# Patient Record
Sex: Female | Born: 1935 | Race: White | Hispanic: No | Smoking: Never smoker
Health system: Southern US, Community
[De-identification: ages and names within clinical notes are randomized; demographics above are authoritative.]

## PROBLEM LIST (undated history)

## (undated) DIAGNOSIS — R1310 Dysphagia, unspecified: Secondary | ICD-10-CM

## (undated) DIAGNOSIS — E46 Unspecified protein-calorie malnutrition: Secondary | ICD-10-CM

## (undated) DIAGNOSIS — R634 Abnormal weight loss: Secondary | ICD-10-CM

## (undated) DIAGNOSIS — M5136 Other intervertebral disc degeneration, lumbar region: Secondary | ICD-10-CM

## (undated) DIAGNOSIS — M51369 Other intervertebral disc degeneration, lumbar region without mention of lumbar back pain or lower extremity pain: Secondary | ICD-10-CM

## (undated) DIAGNOSIS — E079 Disorder of thyroid, unspecified: Secondary | ICD-10-CM

## (undated) DIAGNOSIS — E039 Hypothyroidism, unspecified: Secondary | ICD-10-CM

## (undated) DIAGNOSIS — F0390 Unspecified dementia without behavioral disturbance: Secondary | ICD-10-CM

## (undated) DIAGNOSIS — I639 Cerebral infarction, unspecified: Secondary | ICD-10-CM

## (undated) HISTORY — PX: THYROIDECTOMY: SHX17

## (undated) HISTORY — PX: ABDOMINAL HYSTERECTOMY: SHX81

## (undated) HISTORY — PX: APPENDECTOMY: SHX54

## (undated) HISTORY — PX: HERNIA REPAIR: SHX51

---

## 1998-05-09 ENCOUNTER — Ambulatory Visit (HOSPITAL_COMMUNITY): Admission: RE | Admit: 1998-05-09 | Discharge: 1998-05-09 | Payer: Self-pay | Admitting: Gastroenterology

## 1998-09-01 ENCOUNTER — Encounter: Admission: RE | Admit: 1998-09-01 | Discharge: 1998-11-23 | Payer: Self-pay | Admitting: Anesthesiology

## 1998-09-07 ENCOUNTER — Encounter: Admission: RE | Admit: 1998-09-07 | Discharge: 1998-12-06 | Payer: Self-pay | Admitting: Anesthesiology

## 1998-11-23 ENCOUNTER — Encounter: Admission: RE | Admit: 1998-11-23 | Discharge: 1999-02-09 | Payer: Self-pay | Admitting: Anesthesiology

## 2000-01-14 ENCOUNTER — Inpatient Hospital Stay (HOSPITAL_COMMUNITY): Admission: EM | Admit: 2000-01-14 | Discharge: 2000-01-17 | Payer: Self-pay | Admitting: *Deleted

## 2000-01-14 ENCOUNTER — Encounter: Payer: Self-pay | Admitting: Neurology

## 2000-01-14 ENCOUNTER — Encounter: Payer: Self-pay | Admitting: *Deleted

## 2000-01-17 ENCOUNTER — Inpatient Hospital Stay (HOSPITAL_COMMUNITY): Admission: EM | Admit: 2000-01-17 | Discharge: 2000-01-23 | Payer: Self-pay | Admitting: *Deleted

## 2001-01-28 ENCOUNTER — Encounter: Payer: Self-pay | Admitting: Endocrinology

## 2001-01-28 ENCOUNTER — Encounter: Admission: RE | Admit: 2001-01-28 | Discharge: 2001-01-28 | Payer: Self-pay | Admitting: Endocrinology

## 2001-02-05 ENCOUNTER — Encounter: Payer: Self-pay | Admitting: Emergency Medicine

## 2001-02-05 ENCOUNTER — Inpatient Hospital Stay: Admission: EM | Admit: 2001-02-05 | Discharge: 2001-02-06 | Payer: Self-pay | Admitting: Emergency Medicine

## 2001-02-06 ENCOUNTER — Inpatient Hospital Stay (HOSPITAL_COMMUNITY): Admission: EM | Admit: 2001-02-06 | Discharge: 2001-02-12 | Payer: Self-pay | Admitting: *Deleted

## 2001-12-08 ENCOUNTER — Encounter: Payer: Self-pay | Admitting: Endocrinology

## 2001-12-08 ENCOUNTER — Encounter: Admission: RE | Admit: 2001-12-08 | Discharge: 2001-12-08 | Payer: Self-pay | Admitting: Endocrinology

## 2003-11-21 ENCOUNTER — Emergency Department (HOSPITAL_COMMUNITY): Admission: EM | Admit: 2003-11-21 | Discharge: 2003-11-21 | Payer: Self-pay | Admitting: Emergency Medicine

## 2003-12-11 ENCOUNTER — Inpatient Hospital Stay (HOSPITAL_COMMUNITY): Admission: EM | Admit: 2003-12-11 | Discharge: 2003-12-13 | Payer: Self-pay | Admitting: Emergency Medicine

## 2004-06-02 ENCOUNTER — Encounter: Admission: RE | Admit: 2004-06-02 | Discharge: 2004-06-02 | Payer: Self-pay | Admitting: Endocrinology

## 2005-04-02 ENCOUNTER — Encounter: Admission: RE | Admit: 2005-04-02 | Discharge: 2005-04-02 | Payer: Self-pay | Admitting: Endocrinology

## 2010-12-16 ENCOUNTER — Encounter: Payer: Self-pay | Admitting: *Deleted

## 2012-06-20 ENCOUNTER — Encounter (HOSPITAL_COMMUNITY): Payer: Self-pay | Admitting: Emergency Medicine

## 2012-06-20 ENCOUNTER — Emergency Department (HOSPITAL_COMMUNITY): Payer: Medicare Other

## 2012-06-20 ENCOUNTER — Other Ambulatory Visit: Payer: Self-pay

## 2012-06-20 ENCOUNTER — Inpatient Hospital Stay (HOSPITAL_COMMUNITY)
Admission: EM | Admit: 2012-06-20 | Discharge: 2012-06-23 | DRG: 558 | Disposition: A | Discharge status: Skilled Nursing Facility | Payer: Medicare Other | Attending: Internal Medicine | Admitting: Internal Medicine

## 2012-06-20 DIAGNOSIS — M25559 Pain in unspecified hip: Secondary | ICD-10-CM | POA: Diagnosis present

## 2012-06-20 DIAGNOSIS — W010XXA Fall on same level from slipping, tripping and stumbling without subsequent striking against object, initial encounter: Secondary | ICD-10-CM | POA: Diagnosis present

## 2012-06-20 DIAGNOSIS — Y92009 Unspecified place in unspecified non-institutional (private) residence as the place of occurrence of the external cause: Secondary | ICD-10-CM

## 2012-06-20 DIAGNOSIS — M25569 Pain in unspecified knee: Secondary | ICD-10-CM | POA: Diagnosis present

## 2012-06-20 DIAGNOSIS — IMO0002 Reserved for concepts with insufficient information to code with codable children: Secondary | ICD-10-CM

## 2012-06-20 DIAGNOSIS — Z66 Do not resuscitate: Secondary | ICD-10-CM | POA: Diagnosis present

## 2012-06-20 DIAGNOSIS — S76319A Strain of muscle, fascia and tendon of the posterior muscle group at thigh level, unspecified thigh, initial encounter: Secondary | ICD-10-CM | POA: Diagnosis present

## 2012-06-20 DIAGNOSIS — M6688 Spontaneous rupture of other tendons, other: Principal | ICD-10-CM | POA: Diagnosis present

## 2012-06-20 DIAGNOSIS — S76119A Strain of unspecified quadriceps muscle, fascia and tendon, initial encounter: Secondary | ICD-10-CM

## 2012-06-20 HISTORY — DX: Cerebral infarction, unspecified: I63.9

## 2012-06-20 HISTORY — DX: Disorder of thyroid, unspecified: E07.9

## 2012-06-20 LAB — BASIC METABOLIC PANEL
BUN: 14 mg/dL (ref 6–23)
Calcium: 9 mg/dL (ref 8.4–10.5)
Chloride: 98 mEq/L (ref 96–112)
Creatinine, Ser: 0.83 mg/dL (ref 0.50–1.10)
GFR calc Af Amer: 70 mL/min — ABNORMAL LOW (ref >90)
GFR calc non Af Amer: 60 mL/min — ABNORMAL LOW (ref >90)
Potassium: 3.9 mEq/L (ref 3.5–5.1)
Sodium: 134 mEq/L — ABNORMAL LOW (ref 135–145)

## 2012-06-20 LAB — CBC WITH DIFFERENTIAL/PLATELET
Basophils Absolute: 0 10*3/uL (ref 0.0–0.1)
Basophils Relative: 0 % (ref 0–1)
Eosinophils Absolute: 0.1 10*3/uL (ref 0.0–0.7)
HCT: 39.3 % (ref 36.0–46.0)
Hemoglobin: 13.6 g/dL (ref 12.0–15.0)
Lymphocytes Relative: 26 % (ref 12–46)
Lymphs Abs: 1.8 10*3/uL (ref 0.7–4.0)
MCH: 30.5 pg (ref 26.0–34.0)
MCHC: 34.6 g/dL (ref 30.0–36.0)
Monocytes Absolute: 0.5 10*3/uL (ref 0.1–1.0)
Monocytes Relative: 7 % (ref 3–12)
Neutro Abs: 4.6 10*3/uL (ref 1.7–7.7)
Neutrophils Relative %: 66 % (ref 43–77)
Platelets: 191 10*3/uL (ref 150–400)
RBC: 4.46 MIL/uL (ref 3.87–5.11)
RDW: 14.9 % (ref 11.5–15.5)
WBC: 7 10*3/uL (ref 4.0–10.5)

## 2012-06-20 MED ORDER — SODIUM CHLORIDE 0.9 % IV SOLN
INTRAVENOUS | Status: DC
  Administered 2012-06-20: 09:00:00 via INTRAVENOUS

## 2012-06-20 MED ORDER — ONDANSETRON HCL 4 MG/2ML IJ SOLN: 4.0000 mg | Freq: Four times a day (QID) | INTRAMUSCULAR | Status: DC | PRN

## 2012-06-20 MED ORDER — ONDANSETRON HCL 4 MG PO TABS: 4.0000 mg | ORAL_TABLET | Freq: Four times a day (QID) | ORAL | Status: DC | PRN

## 2012-06-20 MED ORDER — ACETAMINOPHEN 325 MG PO TABS
325.0000 mg | ORAL_TABLET | Freq: Four times a day (QID) | ORAL | Status: DC | PRN
  Administered 2012-06-20 – 2012-06-23 (×3): 325 mg via ORAL
  Filled 2012-06-20 (×3): qty 1

## 2012-06-20 NOTE — H&P (Addendum)
[facility] History and Physical  Tara Nolan JYN:[id number] DOB: 01-24-1919 DOA: 06/20/2012  Referring physician: ER PCP: No primary provider on file.   Chief Complaint: hip pain  HPI:  Was up doing dishes when her knee locked.  She than had hip pain and was able to lower herself to the floor.  She was unable to get off the floor.  Pain is worse with movement.  Patient has been healthy up to this point.  She does her own cooking and cleaning.   In ER she was found to have an avulsion of the Left hamstring tendon.  She was unable to bear weight.  Er spoke with Dr. Turner Daniels who per the ER doctor recommended no acute intervention    Review of Systems:  All systems reviewed, negative unless stated above  Past Medical History  Diagnosis Date  . Stroke   . Thyroid disease    Past Surgical History  Procedure Date  . Hernia repair   . Abdominal hysterectomy   . Appendectomy   . Thyroidectomy    Social History:  reports that she has never smoked. She does not have any smokeless tobacco history on file. She reports that she does not drink alcohol or use illicit drugs. Lived at home and took care of herself Has good family support   No Known Allergies  Family history: +CVA   Prior to Admission medications   Medication Sig Start Date End Date Taking? Authorizing Provider  acetaminophen (TYLENOL) 500 MG tablet Take 125 mg by mouth every 6 (six) hours as needed. For pain.   Yes Historical Provider, MD   Physical Exam: Filed Vitals:   06/20/12 0758 06/20/12 0802 06/20/12 0842  BP: 190/102 183/87 164/81  Pulse:  90   Temp:  97.5 F (36.4 C)   TempSrc:  Oral   Resp: 16 18   Height: 5\' 2"  (1.575 m)    Weight: 52.164 kg (115 lb)    SpO2: 99% 100%      General:  A+Ox3, appears uncomfortable with movement in her LE  Eyes: WNL  ENT: WNL  Neck: supple, no JVD  Cardiovascular: rrr, no murmurs  Respiratory: clear anterior  Abdomen: +BS, soft, NT/ND  Skin: no  rashes or lestions  Musculoskeletal: decreased strength in her Left LE, pain with posterior movement of knee joint  Psychiatric: normal, no SI or HI  Neurologic: WNL  Labs on Admission:  Basic Metabolic Panel:  Lab 06/20/12 8657  NA 134*  K 3.9  CL 98  CO2 23  GLUCOSE 132*  BUN 14  CREATININE 0.83  CALCIUM 9.0  MG --  PHOS --   Liver Function Tests: No results found for this basename: AST:5,ALT:5,ALKPHOS:5,BILITOT:5,PROT:5,ALBUMIN:5 in the last 168 hours No results found for this basename: LIPASE:5,AMYLASE:5 in the last 168 hours No results found for this basename: AMMONIA:5 in the last 168 hours CBC:  Lab 06/20/12 0830  WBC 7.0  NEUTROABS 4.6  HGB 13.6  HCT 39.3  MCV 88.1  PLT 191   Cardiac Enzymes: No results found for this basename: CKTOTAL:5,CKMB:5,CKMBINDEX:5,TROPONINI:5 in the last 168 hours  BNP (last 3 results) No results found for this basename: PROBNP:3 in the last 8760 hours CBG: No results found for this basename: GLUCAP:5 in the last 168 hours  Radiological Exams on Admission: Dg Chest 1 View  06/20/2012  *RADIOLOGY REPORT*  Clinical Data: Preoperative respiratory film.  CHEST - 1 VIEW  Comparison: None.  Findings: The lungs are clear.  No  pneumothorax or pleural fluid. Heart size is normal.  No focal bony abnormality.  IMPRESSION: No acute disease.  Original Report Authenticated By: Bernadene Bell. D'ALESSIO, M.D.   Dg Hip Complete Left  06/20/2012  *RADIOLOGY REPORT*  Clinical Data: Status post fall.  Left hip pain.  LEFT HIP - COMPLETE 2+ VIEW  Comparison: Plain films right hip 04/02/2005.  Findings: Bones are osteopenic.  Hips are located.  No fracture is identified.  Degenerative disease of the symphysis pubis is noted.  IMPRESSION: No acute finding.  Osteopenia.  If clinical concern for fracture persists, MRI without contrast would be useful for further evaluation.  Original Report Authenticated By: Bernadene Bell. Maricela Curet, M.D.   Mr Hip Left Wo  Contrast  06/20/2012  *RADIOLOGY REPORT*  Clinical Data: Left hip pain after injury.  MRI OF THE LEFT HIP WITHOUT CONTRAST  Technique:  Multiplanar, multisequence MR imaging was performed. No intravenous contrast was administered.  Comparison: 06/20/2012 radiographs  Findings: Acutely avulsed left hamstring tendon noted on image 10 of series 7, with surrounding fluid signal intensity and edema which also tracks adjacent to the left sciatic nerve.  No impingement of the sciatic notch.  A band of edema tracks in the proximal left semitendinosus muscle.  No fracture or significant abnormal osseous edema noted.  No hip joint effusion observed.  There is tendinopathy and chronic appearing partial tearing of the right hamstring tendon.  On coronal images there is a suggestion of considerable lumbar spondylosis, query pars defects at L5.  IMPRESSION:  1.  Acute avulsion of the left hamstring tendon, with surrounding edema.  A band of edema also tracks within the proximal left semitendinosus muscle. 2.  Chronic tendinopathy and chronic appearing partial tearing of the right hamstring tendon. 3.  Lower lumbar spondylosis, query pars defects at L5.  Original Report Authenticated By: Dellia Cloud, M.D.   Dg Knee Complete 4 Views Left  06/20/2012  *RADIOLOGY REPORT*  Clinical Data: Fall, pain.  LEFT KNEE - COMPLETE 4+ VIEW  Comparison: None.  Findings: No fracture or dislocation is identified.  Trace amount of joint fluid is noted.  Minimal degenerative change is present about the knee.  Bones appear osteopenic.  IMPRESSION: No acute abnormality.  Original Report Authenticated By: Bernadene Bell. Maricela Curet, M.D.      Assessment/Plan Active Problems:  Hip pain, acute  Knee pain   1. acure Avulsion  of L hamstring tendon-  Paged ortho to discuss plan of care awaiting call back-  How long to heal?  Weight bearing status?  Patient is asking for only tylenol.  She says she is very sensitive to all medications 2.   Knee pain- will see how patient does with PT-- if continues to have knee pain, will get MRI 3. HTN- could be related to pain, patient states her BP is normal at homee    Code Status: DNR Family Communication: son and daughter in law at bedside Disposition Plan: SNF vs home  Time spent: 43  Marlin Canary [facility] Pager [phone]  If 7PM-7AM, please contact night-coverage [email or web address] Password [facility] 06/20/2012, 3:18 PM

## 2012-06-20 NOTE — ED Notes (Signed)
AVW:[id number]<XB> Expected date:06/20/12<BR> Expected time: 7:32 AM<BR> Means of arrival:Ambulance<BR> Comments:<BR> 89yoF, fall, knee pain

## 2012-06-20 NOTE — ED Notes (Signed)
Pt states she is unable to take any pain meds or nausea meds due they "all make me sick" and states she does not want any medications.

## 2012-06-20 NOTE — ED Notes (Signed)
Pt states she felt knee buckle while standing, lowered self to floor and then fell over.

## 2012-06-20 NOTE — ED Provider Notes (Addendum)
History     CSN: [id number]  Arrival date & time 06/20/12  0757   First MD Initiated Contact with Patient 06/20/12 [phone]      Chief Complaint  Patient presents with  . Knee Pain  . Hip Pain    (Consider location/radiation/quality/duration/timing/severity/associated sxs/prior treatment) HPI......... left posterior lateral hip pain well after doing her dishes this morning.  She gently lowered herself to the floor. No head or neck injury. Also states that left knee buckled. Palpation makes symptoms worse. Pain is moderate to severe. Does not take any medications.  Cannot stand  Past Medical History  Diagnosis Date  . Stroke   . Thyroid disease     Past Surgical History  Procedure Date  . Hernia repair   . Abdominal hysterectomy   . Appendectomy   . Thyroidectomy     No family history on file.  History  Substance Use Topics  . Smoking status: Never Smoker   . Smokeless tobacco: Not on file  . Alcohol Use: No    OB History    Grav Para Term Preterm Abortions TAB SAB Ect Mult Living                  Review of Systems  All other systems reviewed and are negative.    Allergies  Review of patient's allergies indicates no known allergies.  Home Medications   Current Outpatient Rx  Name Route Sig Dispense Refill  . ACETAMINOPHEN 500 MG PO TABS Oral Take 125 mg by mouth every 6 (six) hours as needed. For pain.      BP 183/87  Pulse 90  Temp 97.5 F (36.4 C) (Oral)  Resp 18  Ht 5\' 2"  (1.575 m)  Wt 115 lb (52.164 kg)  BMI 21.03 kg/m2  SpO2 100%  Physical Exam  Nursing note and vitals reviewed. Constitutional: She is oriented to person, place, and time. She appears well-developed and well-nourished.  HENT:  Head: Normocephalic and atraumatic.  Eyes: Conjunctivae and EOM are normal. Pupils are equal, round, and reactive to light.  Neck: Normal range of motion. Neck supple.  Cardiovascular: Normal rate and regular rhythm.   Pulmonary/Chest: Effort normal  and breath sounds normal.  Abdominal: Soft. Bowel sounds are normal.  Musculoskeletal: Normal range of motion.       Tender posterior lateral left hip and minimally tender peri Left knee  Neurological: She is alert and oriented to person, place, and time.  Skin: Skin is warm and dry.  Psychiatric: She has a normal mood and affect.    ED Course  Procedures (including critical care time)   Labs Reviewed  CBC WITH DIFFERENTIAL  BASIC METABOLIC PANEL   No results found.   No diagnosis found.  Date: 06/20/2012  Rate: 97  Rhythm: normal sinus rhythm  QRS Axis: normal  Intervals: normal  ST/T Wave abnormalities: normal  Conduction Disutrbances:left anterior fascicular block  Narrative Interpretation:   Old EKG Reviewed: changes noted 1st degree AV block   Results for orders placed during the hospital encounter of 06/20/12  CBC WITH DIFFERENTIAL      Component Value Range   WBC 7.0  4.0 - 10.5 K/uL   RBC 4.46  3.87 - 5.11 MIL/uL   Hemoglobin 13.6  12.0 - 15.0 g/dL   HCT 11.9  14.7 - 82.9 %   MCV 88.1  78.0 - 100.0 fL   MCH 30.5  26.0 - 34.0 pg   MCHC 34.6  30.0 - 36.0 g/dL  RDW 14.9  11.5 - 15.5 %   Platelets 191  150 - 400 K/uL   Neutrophils Relative 66  43 - 77 %   Neutro Abs 4.6  1.7 - 7.7 K/uL   Lymphocytes Relative 26  12 - 46 %   Lymphs Abs 1.8  0.7 - 4.0 K/uL   Monocytes Relative 7  3 - 12 %   Monocytes Absolute 0.5  0.1 - 1.0 K/uL   Eosinophils Relative 1  0 - 5 %   Eosinophils Absolute 0.1  0.0 - 0.7 K/uL   Basophils Relative 0  0 - 1 %   Basophils Absolute 0.0  0.0 - 0.1 K/uL  BASIC METABOLIC PANEL      Component Value Range   Sodium 134 (*) 135 - 145 mEq/L   Potassium 3.9  3.5 - 5.1 mEq/L   Chloride 98  96 - 112 mEq/L   CO2 23  19 - 32 mEq/L   Glucose, Bld 132 (*) 70 - 99 mg/dL   BUN 14  6 - 23 mg/dL   Creatinine, Ser 1.61  0.50 - 1.10 mg/dL   Calcium 9.0  8.4 - 09.6 mg/dL   GFR calc non Af Amer 60 (*) >90 mL/min   GFR calc Af Amer 70 (*) >90  mL/min    Dg Chest 1 View  06/20/2012  *RADIOLOGY REPORT*  Clinical Data: Preoperative respiratory film.  CHEST - 1 VIEW  Comparison: None.  Findings: The lungs are clear.  No pneumothorax or pleural fluid. Heart size is normal.  No focal bony abnormality.  IMPRESSION: No acute disease.  Original Report Authenticated By: Bernadene Bell. D'ALESSIO, M.D.   Dg Hip Complete Left  06/20/2012  *RADIOLOGY REPORT*  Clinical Data: Status post fall.  Left hip pain.  LEFT HIP - COMPLETE 2+ VIEW  Comparison: Plain films right hip 04/02/2005.  Findings: Bones are osteopenic.  Hips are located.  No fracture is identified.  Degenerative disease of the symphysis pubis is noted.  IMPRESSION: No acute finding.  Osteopenia.  If clinical concern for fracture persists, MRI without contrast would be useful for further evaluation.  Original Report Authenticated By: Bernadene Bell. Maricela Curet, M.D.   Mr Hip Left Wo Contrast  06/20/2012  *RADIOLOGY REPORT*  Clinical Data: Left hip pain after injury.  MRI OF THE LEFT HIP WITHOUT CONTRAST  Technique:  Multiplanar, multisequence MR imaging was performed. No intravenous contrast was administered.  Comparison: 06/20/2012 radiographs  Findings: Acutely avulsed left hamstring tendon noted on image 10 of series 7, with surrounding fluid signal intensity and edema which also tracks adjacent to the left sciatic nerve.  No impingement of the sciatic notch.  A band of edema tracks in the proximal left semitendinosus muscle.  No fracture or significant abnormal osseous edema noted.  No hip joint effusion observed.  There is tendinopathy and chronic appearing partial tearing of the right hamstring tendon.  On coronal images there is a suggestion of considerable lumbar spondylosis, query pars defects at L5.  IMPRESSION:  1.  Acute avulsion of the left hamstring tendon, with surrounding edema.  A band of edema also tracks within the proximal left semitendinosus muscle. 2.  Chronic tendinopathy and chronic  appearing partial tearing of the right hamstring tendon. 3.  Lower lumbar spondylosis, query pars defects at L5.  Original Report Authenticated By: Dellia Cloud, M.D.   Dg Knee Complete 4 Views Left  06/20/2012  *RADIOLOGY REPORT*  Clinical Data: Fall, pain.  LEFT KNEE -  COMPLETE 4+ VIEW  Comparison: None.  Findings: No fracture or dislocation is identified.  Trace amount of joint fluid is noted.  Minimal degenerative change is present about the knee.  Bones appear osteopenic.  IMPRESSION: No acute abnormality.  Original Report Authenticated By: Bernadene Bell. D'ALESSIO, M.D.    MDM  MRI shows acute avulsion of the left string tendon. Patient is unable to bear weight. Will admit to general medicine. Discussed with Dr. Turner Daniels in orthopedics who recommended no acute intervention.  Discussed with patient and son and daughter-in-law        Donnetta Hutching, MD 06/20/12 1426  Donnetta Hutching, MD 06/20/12 [phone]

## 2012-06-20 NOTE — ED Notes (Signed)
Patient transported to X-ray 

## 2012-06-20 NOTE — ED Notes (Signed)
Pt states she was standing and felt L knee buckle, lowered self to floor and once sitting she was leaning over to reach phone and pain was so bad she fell over.

## 2012-06-20 NOTE — Progress Notes (Signed)
[facility] ED CM spoke with pt about listing of no pcp.  Pt states she has been seeing Dr Lucianne Muss in past.  Prefers to go to urgent care centers for services at this time

## 2012-06-20 NOTE — ED Notes (Signed)
Pt. Was able to bare on the right leg, but was unable to ambulate. Family members were at the bedside and daughter was reqesting a walker. Nurse was notified.

## 2012-06-21 DIAGNOSIS — S838X9A Sprain of other specified parts of unspecified knee, initial encounter: Secondary | ICD-10-CM

## 2012-06-21 LAB — CBC
MCH: 29.9 pg (ref 26.0–34.0)
MCV: 88.9 fL (ref 78.0–100.0)
Platelets: 169 10*3/uL (ref 150–400)
RBC: 4.15 MIL/uL (ref 3.87–5.11)
RDW: 15.2 % (ref 11.5–15.5)
WBC: 6.2 10*3/uL (ref 4.0–10.5)

## 2012-06-21 LAB — BASIC METABOLIC PANEL
CO2: 25 mEq/L (ref 19–32)
Calcium: 8.3 mg/dL — ABNORMAL LOW (ref 8.4–10.5)
Creatinine, Ser: 0.87 mg/dL (ref 0.50–1.10)
GFR calc non Af Amer: 57 mL/min — ABNORMAL LOW (ref >90)
Sodium: 138 mEq/L (ref 135–145)

## 2012-06-21 NOTE — Evaluation (Signed)
Physical Therapy Evaluation Patient Details Name: Tara Nolan MRN: [id number] DOB: [birth year] Today's Date: 06/21/2012 Time: 7829-5621 PT Time Calculation (min): 23 min  PT Assessment / Plan / Recommendation Clinical Impression  78 y.o. female with L hamstring tendon rupture. Prior to admission pt was independent with mobility using a quad cane for going out of home. She states her RLE is weak from prior CVA and injury, crepitus noted with R knee ROM. Stand Pivot transfer with RW and assist of 2, for bed to chair. Activity tolerance limited by pain. ST-SNF recommended.     PT Assessment  Patient needs continued PT services    Follow Up Recommendations  Skilled nursing facility;Supervision/Assistance - 24 hour    Barriers to Discharge None      Equipment Recommendations  Defer to next venue    Recommendations for Other Services     Frequency Min 5X/week    Precautions / Restrictions Precautions Precautions: None Restrictions Weight Bearing Restrictions: No   Pertinent Vitals/Pain *9/10 with movement, pt refused pain medication BP 171/98, HR 104 after bed to chair transfer**      Mobility  Bed Mobility Bed Mobility: Supine to Sit Supine to Sit: 1: +2 Total assist Supine to Sit: Patient Percentage: 40% Details for Bed Mobility Assistance: assist to elevate trunk and support/advance LEs to EOB Transfers Transfers: Stand Pivot Transfers;Sit to Stand;Stand to Sit Sit to Stand: From bed;1: +2 Total assist Sit to Stand: Patient Percentage: 50% Stand to Sit: To chair/3-in-1;1: +2 Total assist Stand to Sit: Patient Percentage: 60% Stand Pivot Transfers: 1: +2 Total assist Stand Pivot Transfers: Patient Percentage: 70% Details for Transfer Assistance: assist to pivot with RW Ambulation/Gait Ambulation/Gait Assistance: Not tested (comment)    Exercises General Exercises - Lower Extremity Ankle Circles/Pumps: AROM;Both;10 reps;Seated Long Arc Quad: AAROM;Left;Seated;5  reps   PT Diagnosis: Difficulty walking;Acute pain  PT Problem List: Decreased range of motion;Decreased activity tolerance;Decreased strength;Decreased mobility;Pain;Decreased knowledge of use of DME PT Treatment Interventions: DME instruction;Gait training;Functional mobility training;Therapeutic activities;Therapeutic exercise;Patient/family education   PT Goals Acute Rehab PT Goals PT Goal Formulation: With patient/family Time For Goal Achievement: 06/28/12 Potential to Achieve Goals: Fair Pt will go Supine/Side to Sit: with min assist PT Goal: Supine/Side to Sit - Progress: Goal set today Pt will go Sit to Stand: with min assist PT Goal: Sit to Stand - Progress: Goal set today Pt will Transfer Bed to Chair/Chair to Bed: with min assist PT Transfer Goal: Bed to Chair/Chair to Bed - Progress: Goal set today Pt will Ambulate: 1 - 15 feet;with min assist PT Goal: Ambulate - Progress: Goal set today  Visit Information  Last PT Received On: 06/21/12 Assistance Needed: +2    Subjective Data  Subjective: I'm disappointed in myself for not doing better (with getting OOB). Patient Stated Goal: to move better   Prior Functioning  Home Living Lives With: Alone Available Help at Discharge: Skilled Nursing Facility Home Access: Stairs to enter Entrance Stairs-Number of Steps: 1 Home Layout: One level Bathroom Shower/Tub: Tub/shower unit Home Adaptive Equipment: Printmaker cane Prior Function Level of Independence: Independent with assistive device(s) Able to Take Stairs?: No Driving: No Vocation: Retired Musician: No difficulties    Cognition  Overall Cognitive Status: Appears within functional limits for tasks assessed/performed Arousal/Alertness: Awake/alert Orientation Level: Appears intact for tasks assessed Behavior During Session: [facility] for tasks performed    Extremity/Trunk Assessment Right Upper Extremity Assessment RUE ROM/Strength/Tone:  [facility] for tasks assessed Left Upper Extremity Assessment  LUE ROM/Strength/Tone: WFL for tasks assessed Right Lower Extremity Assessment RLE ROM/Strength/Tone: Within functional levels RLE Sensation: WFL - Light Touch RLE Coordination: WFL - gross/fine motor Left Lower Extremity Assessment LLE ROM/Strength/Tone: Deficits;Due to pain LLE ROM/Strength/Tone Deficits: L ankle WFL, knee  extension AAROM -20* limited by pain, Hip AAROM WFL LLE Sensation: WFL - Light Touch Trunk Assessment Trunk Assessment: Normal   Balance Balance Balance Assessed: Yes Static Sitting Balance Static Sitting - Balance Support: Bilateral upper extremity supported;Feet supported Static Sitting - Level of Assistance: 6: Modified independent (Device/Increase time)  End of Session PT - End of Session Equipment Utilized During Treatment: Gait belt Patient left: in chair;with call bell/phone within reach;with family/visitor present Nurse Communication: Mobility status  GP     Ralene Bathe Kistler 06/21/2012, 12:31 PM  [phone]

## 2012-06-21 NOTE — Progress Notes (Signed)
Order received, chart reviewed, noted plan is for pt to go to SNF short term, will defer OT eval to that facility. Acute OT will sign off.

## 2012-06-21 NOTE — Progress Notes (Signed)
[facility] PROGRESS NOTE  Tara Nolan WUJ:[id number] DOB: [birth year] DOA: 06/20/2012 PCP: No primary provider on file.  Assessment/Plan: Active Problems:  Hip pain, acute  Knee pain  Hamstring tendon rupture  1. Hamstring tendon rupture: will need rehab- consult PT (plan for at least 3 weeks),  2. Hip pain: better, will need PT 3. Knee pain: ROM better, pain better    Code Status: dnr Family Communication: LM for son Disposition Plan: rehab    HPI/Subjective: Felling better Knee and hip feeling better   Objective: Filed Vitals:   06/20/12 1617 06/20/12 1622 06/20/12 2145 06/21/12 0605  BP:  147/84 137/80 135/75  Pulse:  87 92 80  Temp:  97.7 F (36.5 C) 98.5 F (36.9 C) 98 F (36.7 C)  TempSrc:  Axillary Oral Oral  Resp:  16 14 14   Height: 5\' 3"  (1.6 m)     Weight: 52.2 kg (115 lb 1.3 oz)     SpO2:  98% 98% 97%    Intake/Output Summary (Last 24 hours) at 06/21/12 0857 Last data filed at 06/20/12 2146  Gross per 24 hour  Intake      0 ml  Output    550 ml  Net   -550 ml    Exam:   General:  A+Ox3, NAD  Cardiovascular: rrr  Respiratory: clear anterior  Abdomen: +BS, soft, NT/ND  Skin: no rashes or lesions  Ext: -c/c/e, knee and hip flexion as well as strength improved  Data Reviewed: Basic Metabolic Panel:  Lab 06/21/12 9562 06/20/12 0830  NA 138 134*  K 3.6 3.9  CL 106 98  CO2 25 23  GLUCOSE 83 132*  BUN 10 14  CREATININE 0.87 0.83  CALCIUM 8.3* 9.0  MG -- --  PHOS -- --   Liver Function Tests: No results found for this basename: AST:5,ALT:5,ALKPHOS:5,BILITOT:5,PROT:5,ALBUMIN:5 in the last 168 hours No results found for this basename: LIPASE:5,AMYLASE:5 in the last 168 hours No results found for this basename: AMMONIA:5 in the last 168 hours CBC:  Lab 06/21/12 0458 06/20/12 0830  WBC 6.2 7.0  NEUTROABS -- 4.6  HGB 12.4 13.6  HCT 36.9 39.3  MCV 88.9 88.1  PLT 169 191   Cardiac Enzymes: No results found for  this basename: CKTOTAL:5,CKMB:5,CKMBINDEX:5,TROPONINI:5 in the last 168 hours BNP (last 3 results) No results found for this basename: PROBNP:3 in the last 8760 hours CBG: No results found for this basename: GLUCAP:5 in the last 168 hours  No results found for this or any previous visit (from the past 240 hour(s)).   Studies: Dg Chest 1 View  06/20/2012  *RADIOLOGY REPORT*  Clinical Data: Preoperative respiratory film.  CHEST - 1 VIEW  Comparison: None.  Findings: The lungs are clear.  No pneumothorax or pleural fluid. Heart size is normal.  No focal bony abnormality.  IMPRESSION: No acute disease.  Original Report Authenticated By: Bernadene Bell. D'ALESSIO, M.D.   Dg Hip Complete Left  06/20/2012  *RADIOLOGY REPORT*  Clinical Data: Status post fall.  Left hip pain.  LEFT HIP - COMPLETE 2+ VIEW  Comparison: Plain films right hip 04/02/2005.  Findings: Bones are osteopenic.  Hips are located.  No fracture is identified.  Degenerative disease of the symphysis pubis is noted.  IMPRESSION: No acute finding.  Osteopenia.  If clinical concern for fracture persists, MRI without contrast would be useful for further evaluation.  Original Report Authenticated By: Bernadene Bell. Maricela Curet, M.D.   Mr Hip Left Wo Contrast  06/20/2012  *  RADIOLOGY REPORT*  Clinical Data: Left hip pain after injury.  MRI OF THE LEFT HIP WITHOUT CONTRAST  Technique:  Multiplanar, multisequence MR imaging was performed. No intravenous contrast was administered.  Comparison: 06/20/2012 radiographs  Findings: Acutely avulsed left hamstring tendon noted on image 10 of series 7, with surrounding fluid signal intensity and edema which also tracks adjacent to the left sciatic nerve.  No impingement of the sciatic notch.  A band of edema tracks in the proximal left semitendinosus muscle.  No fracture or significant abnormal osseous edema noted.  No hip joint effusion observed.  There is tendinopathy and chronic appearing partial tearing of the right  hamstring tendon.  On coronal images there is a suggestion of considerable lumbar spondylosis, query pars defects at L5.  IMPRESSION:  1.  Acute avulsion of the left hamstring tendon, with surrounding edema.  A band of edema also tracks within the proximal left semitendinosus muscle. 2.  Chronic tendinopathy and chronic appearing partial tearing of the right hamstring tendon. 3.  Lower lumbar spondylosis, query pars defects at L5.  Original Report Authenticated By: Dellia Cloud, M.D.   Dg Knee Complete 4 Views Left  06/20/2012  *RADIOLOGY REPORT*  Clinical Data: Fall, pain.  LEFT KNEE - COMPLETE 4+ VIEW  Comparison: None.  Findings: No fracture or dislocation is identified.  Trace amount of joint fluid is noted.  Minimal degenerative change is present about the knee.  Bones appear osteopenic.  IMPRESSION: No acute abnormality.  Original Report Authenticated By: Bernadene Bell. D'ALESSIO, M.D.    Scheduled Meds:  Continuous Infusions:   . DISCONTD: sodium chloride 125 mL/hr at 06/20/12 1610    Active Problems:  Hip pain, acute  Knee pain  Hamstring tendon rupture    Time spent: 25    Tara Nolan  [facility] Pager [phone]  06/21/2012, 8:57 AM  LOS: 1 day

## 2012-06-22 NOTE — Progress Notes (Signed)
Clinical Social Work Department CLINICAL SOCIAL WORK PLACEMENT NOTE 06/22/2012  Patient:  Tara Nolan, Tara Nolan  Account Number:  [id number] Admit date:  06/20/2012  Clinical Social Worker:  Leron Croak, CLINICAL SOCIAL WORKER  Date/time:  06/22/2012 09:27 AM  Clinical Social Work is seeking post-discharge placement for this patient at the following level of care:   SKILLED NURSING   (*CSW will update this form in Epic as items are completed)   06/21/2012  Patient/family provided with [facility] Department of Clinical Social Work's list of facilities offering this level of care within the geographic area requested by the patient (or if unable, by the patient's family).  06/21/2012  Patient/family informed of their freedom to choose among providers that offer the needed level of care, that participate in Medicare, Medicaid or managed care program needed by the patient, have an available bed and are willing to accept the patient.  06/21/2012  Patient/family informed of [facility]' ownership interest in [facility], as well as of the fact that they are under no obligation to receive care at this facility.  PASARR submitted to EDS on 06/22/2012 PASARR number received from EDS on 06/22/2012  FL2 transmitted to all facilities in geographic area requested by pt/family on  06/22/2012 FL2 transmitted to all facilities within larger geographic area on 06/22/2012  Patient informed that his/her managed care company has contracts with or will negotiate with  certain facilities, including the following:   Daughter in-law knows [facility] and would like that facility f possible.     Patient/family informed of bed offers received:   Patient chooses bed at  Physician recommends and patient chooses bed at    Patient to be transferred to  on   Patient to be transferred to facility by   The following physician request were entered in Epic:   Additional Comments:  Venancio Poisson Weekend Coverage [phone]

## 2012-06-22 NOTE — Progress Notes (Signed)
Physical Therapy Treatment Patient Details Name: Tara Nolan MRN: [id number] DOB: 1919/02/24 Today's Date: 06/22/2012 Time: 9629-5284 PT Time Calculation (min): 16 min  PT Assessment / Plan / Recommendation Comments on Treatment Session  Pt with improved activity tolerance today. Pt agreed to premedication with Tylenol.  Less assistance required for supine to sit and for pivot transfer to chair with RW. Unable to WB through LLE 2* pain. Improved tolerance of AAROM of LLE. Pt pleased with progress. SNF recommended.     Follow Up Recommendations  Skilled nursing facility;Supervision/Assistance - 24 hour    Barriers to Discharge        Equipment Recommendations  Defer to next venue    Recommendations for Other Services    Frequency Min 5X/week   Plan Discharge plan remains appropriate;Frequency remains appropriate    Precautions / Restrictions Precautions Precautions: None Restrictions Weight Bearing Restrictions: No   Pertinent Vitals/Pain *6/10 L posterior hip, premedicated with Tylenol**    Mobility  Bed Mobility Bed Mobility: Supine to Sit Supine to Sit: 1: +2 Total assist Supine to Sit: Patient Percentage: 60% Details for Bed Mobility Assistance: assist to elevate trunk and support/advance LEs to EOB Transfers Transfers: Stand Pivot Transfers;Sit to Stand;Stand to Sit Sit to Stand: From bed;1: +2 Total assist;With upper extremity assist Sit to Stand: Patient Percentage: 70% Stand to Sit: To chair/3-in-1;1: +2 Total assist;With upper extremity assist;With armrests Stand to Sit: Patient Percentage: 80% Stand Pivot Transfers: 1: +2 Total assist Stand Pivot Transfers: Patient Percentage: 80% Details for Transfer Assistance: assist to pivot with RW, pt unable to WB through LLE 2* pain, pivoted on RLE with RW, assist for maneuvering RW Ambulation/Gait Ambulation/Gait Assistance: Not tested (comment)    Exercises General Exercises - Lower Extremity Ankle  Circles/Pumps: AROM;Both;10 reps;Seated Long Arc Quad: AAROM;Left;Seated;5 reps Heel Slides: AAROM;Left;10 reps;Supine Hip ABduction/ADduction: AAROM;Left;10 reps;Supine   PT Diagnosis:    PT Problem List:   PT Treatment Interventions:     PT Goals Acute Rehab PT Goals PT Goal Formulation: With patient/family Time For Goal Achievement: 06/28/12 Potential to Achieve Goals: Fair Pt will go Supine/Side to Sit: with min assist PT Goal: Supine/Side to Sit - Progress: Progressing toward goal Pt will go Sit to Stand: with min assist PT Goal: Sit to Stand - Progress: Progressing toward goal Pt will Transfer Bed to Chair/Chair to Bed: with min assist PT Transfer Goal: Bed to Chair/Chair to Bed - Progress: Progressing toward goal Pt will Ambulate: 1 - 15 feet;with min assist  Visit Information  Last PT Received On: 06/22/12 Assistance Needed: +2    Subjective Data  Subjective: I'm doing a little better today. I took a Tylenol, and I usually don't take medicine. Patient Stated Goal: to move better   Cognition  Overall Cognitive Status: Appears within functional limits for tasks assessed/performed Arousal/Alertness: Awake/alert Orientation Level: Appears intact for tasks assessed Behavior During Session: [facility] for tasks performed    Balance  Balance Balance Assessed: Yes Static Sitting Balance Static Sitting - Balance Support: Bilateral upper extremity supported;Feet supported Static Sitting - Level of Assistance: 6: Modified independent (Device/Increase time) Static Sitting - Comment/# of Minutes: 3  End of Session PT - End of Session Equipment Utilized During Treatment: Gait belt Activity Tolerance: Patient limited by pain;Patient limited by fatigue Patient left: in chair;with call bell/phone within reach Nurse Communication: Mobility status   GP     Ralene Bathe Kistler 06/22/2012, 10:43 AM [phone]

## 2012-06-22 NOTE — Progress Notes (Signed)
[facility] PROGRESS NOTE  Tara Nolan ZOX:[id number] DOB: 07/27/1919 DOA: 06/20/2012 PCP: No primary provider on file.  Assessment/Plan: Active Problems:  Hip pain, acute  Knee pain  Hamstring tendon rupture  1. Hamstring tendon rupture: will need rehab- consult PT (plan for at least 3 weeks), premedicate with tylenol for pain control before PT 2. Hip pain: better, will need PT 3. Knee pain: ROM better, pain better- hold off on MRI for now    Code Status: dnr Family Communication: LM for son Disposition Plan: rehab    HPI/Subjective: Felling better Knee and hip feeling better   Objective: Filed Vitals:   06/21/12 1214 06/21/12 1450 06/21/12 2144 06/22/12 0642  BP: 171/98 160/76 132/77 130/74  Pulse: 104 82 75 80  Temp:  98.2 F (36.8 C) 98 F (36.7 C) 97.5 F (36.4 C)  TempSrc:  Oral Oral Oral  Resp:  14 14 15   Height:      Weight:      SpO2: 97% 97% 94% 96%    Intake/Output Summary (Last 24 hours) at 06/22/12 0914 Last data filed at 06/22/12 0900  Gross per 24 hour  Intake   1320 ml  Output      0 ml  Net   1320 ml    Exam:   General:  A+Ox3, NAD  Cardiovascular: rrr  Respiratory: clear anterior  Abdomen: +BS, soft, NT/ND  Skin: no rashes or lesions  Ext: -c/c/e, knee and hip flexion as well as strength improved  Data Reviewed: Basic Metabolic Panel:  Lab 06/21/12 8119 06/20/12 0830  NA 138 134*  K 3.6 3.9  CL 106 98  CO2 25 23  GLUCOSE 83 132*  BUN 10 14  CREATININE 0.87 0.83  CALCIUM 8.3* 9.0  MG -- --  PHOS -- --   Liver Function Tests: No results found for this basename: AST:5,ALT:5,ALKPHOS:5,BILITOT:5,PROT:5,ALBUMIN:5 in the last 168 hours No results found for this basename: LIPASE:5,AMYLASE:5 in the last 168 hours No results found for this basename: AMMONIA:5 in the last 168 hours CBC:  Lab 06/21/12 0458 06/20/12 0830  WBC 6.2 7.0  NEUTROABS -- 4.6  HGB 12.4 13.6  HCT 36.9 39.3  MCV 88.9 88.1  PLT 169  191   Cardiac Enzymes: No results found for this basename: CKTOTAL:5,CKMB:5,CKMBINDEX:5,TROPONINI:5 in the last 168 hours BNP (last 3 results) No results found for this basename: PROBNP:3 in the last 8760 hours CBG: No results found for this basename: GLUCAP:5 in the last 168 hours  No results found for this or any previous visit (from the past 240 hour(s)).   Studies: Dg Chest 1 View  06/20/2012  *RADIOLOGY REPORT*  Clinical Data: Preoperative respiratory film.  CHEST - 1 VIEW  Comparison: None.  Findings: The lungs are clear.  No pneumothorax or pleural fluid. Heart size is normal.  No focal bony abnormality.  IMPRESSION: No acute disease.  Original Report Authenticated By: Bernadene Bell. D'ALESSIO, M.D.   Dg Hip Complete Left  06/20/2012  *RADIOLOGY REPORT*  Clinical Data: Status post fall.  Left hip pain.  LEFT HIP - COMPLETE 2+ VIEW  Comparison: Plain films right hip 04/02/2005.  Findings: Bones are osteopenic.  Hips are located.  No fracture is identified.  Degenerative disease of the symphysis pubis is noted.  IMPRESSION: No acute finding.  Osteopenia.  If clinical concern for fracture persists, MRI without contrast would be useful for further evaluation.  Original Report Authenticated By: Bernadene Bell. Maricela Curet, M.D.   Mr Hip Left Wo  Contrast  06/20/2012  *RADIOLOGY REPORT*  Clinical Data: Left hip pain after injury.  MRI OF THE LEFT HIP WITHOUT CONTRAST  Technique:  Multiplanar, multisequence MR imaging was performed. No intravenous contrast was administered.  Comparison: 06/20/2012 radiographs  Findings: Acutely avulsed left hamstring tendon noted on image 10 of series 7, with surrounding fluid signal intensity and edema which also tracks adjacent to the left sciatic nerve.  No impingement of the sciatic notch.  A band of edema tracks in the proximal left semitendinosus muscle.  No fracture or significant abnormal osseous edema noted.  No hip joint effusion observed.  There is tendinopathy and  chronic appearing partial tearing of the right hamstring tendon.  On coronal images there is a suggestion of considerable lumbar spondylosis, query pars defects at L5.  IMPRESSION:  1.  Acute avulsion of the left hamstring tendon, with surrounding edema.  A band of edema also tracks within the proximal left semitendinosus muscle. 2.  Chronic tendinopathy and chronic appearing partial tearing of the right hamstring tendon. 3.  Lower lumbar spondylosis, query pars defects at L5.  Original Report Authenticated By: Dellia Cloud, M.D.   Dg Knee Complete 4 Views Left  06/20/2012  *RADIOLOGY REPORT*  Clinical Data: Fall, pain.  LEFT KNEE - COMPLETE 4+ VIEW  Comparison: None.  Findings: No fracture or dislocation is identified.  Trace amount of joint fluid is noted.  Minimal degenerative change is present about the knee.  Bones appear osteopenic.  IMPRESSION: No acute abnormality.  Original Report Authenticated By: Bernadene Bell. D'ALESSIO, M.D.    Scheduled Meds:  Continuous Infusions:   Active Problems:  Hip pain, acute  Knee pain  Hamstring tendon rupture    Time spent: 25    Marlin Canary  [facility] Pager [phone]  06/22/2012, 9:14 AM  LOS: 2 days

## 2012-06-22 NOTE — Progress Notes (Signed)
Clinical Social Work Department BRIEF PSYCHOSOCIAL ASSESSMENT 06/22/2012  Patient:  Tara Nolan, Tara Nolan     Account Number:  [id number]     Admit date:  06/20/2012  Clinical Social Worker:  Leron Croak, CLINICAL SOCIAL WORKER  Date/Time:  06/22/2012 09:02 AM  Referred by:  Physician  Date Referred:  06/21/2012 Referred for  SNF Placement   Other Referral:   Interview type:  Patient Other interview type:   Daughter in-law and son Tom    PSYCHOSOCIAL DATA Living Status:  ALONE Admitted from facility:   Level of care:   Primary support name:  Geneticist, molecular Primary support relationship to patient:  CHILD, ADULT Degree of support available:   good    CURRENT CONCERNS Current Concerns  Post-Acute Placement   Other Concerns:    SOCIAL WORK ASSESSMENT / PLAN CSW met with Pt, son Elijah Birk, and daughter in-law to discuss referral for SNF and d/c planning. Daughter in-law is familiar with SNF's and would prefer [facility] if possible. Pt and family agreeable to faxing out to [employer] for backup placements.   Assessment/plan status:  Information/Referral to [facility] Other assessment/ plan:   Information/referral to community resources:   CSW provided Pt/family with a [employer].    PATIENT'S/FAMILY'S RESPONSE TO PLAN OF CARE: Pt and family are agreeable to SNF placement and appreciative of assistance with placement.        Leron Croak, LCSWA [employer] Coverage [phone]

## 2012-06-23 ENCOUNTER — Inpatient Hospital Stay (HOSPITAL_COMMUNITY): Payer: Medicare Other

## 2012-06-23 NOTE — Progress Notes (Signed)
Clinical Social Work Department CLINICAL SOCIAL WORK PLACEMENT NOTE 06/23/2012  Patient:  Tara Nolan, Tara Nolan  Account Number:  [id number] Admit date:  06/20/2012  Clinical Social Worker:  Leron Croak, CLINICAL SOCIAL WORKER  Date/time:  06/22/2012 09:27 AM  Clinical Social Work is seeking post-discharge placement for this patient at the following level of care:   SKILLED NURSING   (*CSW will update this form in Epic as items are completed)   06/21/2012  Patient/family provided with [facility] Department of Clinical Social Work's list of facilities offering this level of care within the geographic area requested by the patient (or if unable, by the patient's family).  06/21/2012  Patient/family informed of their freedom to choose among providers that offer the needed level of care, that participate in Medicare, Medicaid or managed care program needed by the patient, have an available bed and are willing to accept the patient.  06/21/2012  Patient/family informed of [facility]' ownership interest in [facility], as well as of the fact that they are under no obligation to receive care at this facility.  PASARR submitted to EDS on 06/22/2012 PASARR number received from EDS on 06/22/2012  FL2 transmitted to all facilities in geographic area requested by pt/family on  06/22/2012 FL2 transmitted to all facilities within larger geographic area on 06/22/2012  Patient informed that his/her managed care company has contracts with or will negotiate with  certain facilities, including the following:   Daughter in-law knows [facility] and would like that facility f possible.     Patient/family informed of bed offers received:  06/23/2012 Patient [facility] bed at [facility] Physician recommends and patient chooses bed at    Patient to be transferred to [facility] on  06/23/2012 Patient to be transferred to facility by [facility]  The  following physician request were entered in Epic:   Additional Comments:  Cori Razor LCSW [phone]

## 2012-06-23 NOTE — Discharge Summary (Signed)
Physician Discharge Summary  Tara Nolan ZOX:[id number] DOB: 03-Sep-1919 DOA: 06/20/2012  PCP: No primary provider on file.  Admit date: 06/20/2012 Discharge date: 06/23/2012  Recommendations for Outpatient Follow-up:  Continued PT for rehab, rolling walker, +/- brace  Discharge Diagnoses:  Active Problems:  Hip pain, acute  Knee pain  Hamstring tendon rupture   Discharge Condition: improved  Diet recommendation: regular  Wt Readings from Last 3 Encounters:  06/20/12 52.2 kg (115 lb 1.3 oz)    History of present illness:  Was up doing dishes when her knee locked. She than had hip pain and was able to lower herself to the floor. She was unable to get off the floor. Pain is worse with movement. Patient has been healthy up to this point. She does her own cooking and cleaning.  In ER she was found to have an avulsion of the Left hamstring tendon. She was unable to bear weight. Er spoke with Dr. Turner Daniels who per the ER doctor recommended no acute intervention   Hospital Course:  1. Hamstring tendon rupture: will need rehab- consult PT (plan for at least 3 weeks), premedicate with tylenol for pain control before PT 2. Hip pain: better, will need PT 3. Knee pain: ROM better, MRI showed meniscus tear  Procedures:  MRI hip and knee  Consultations:  Ortho on phone  Discharge Exam: Filed Vitals:   06/23/12 0510  BP: 127/76  Pulse: 75  Temp: 98.3 F (36.8 C)  Resp: 14   Filed Vitals:   06/22/12 1031 06/22/12 1826 06/22/12 2210 06/23/12 0510  BP: 126/75 134/61 128/75 127/76  Pulse: 83 84 81 75  Temp: 97.9 F (36.6 C) 98.2 F (36.8 C) 98.4 F (36.9 C) 98.3 F (36.8 C)  TempSrc: Oral Oral Oral Oral  Resp: 14 14 14 14   Height:      Weight:      SpO2: 96% 94% 96% 94%    General: a+Ox3, pleasant and cooperative Cardiovascular: rrr Respiratory: clear  Discharge Instructions  Discharge Orders    Future Orders Please Complete By Expires   Diet general      Increase activity slowly      Discharge instructions      Comments:   No restrictions on PT     Medication List  As of 06/23/2012 12:57 PM   TAKE these medications         acetaminophen 500 MG tablet   Commonly known as: TYLENOL   Take 125 mg by mouth every 6 (six) hours as needed. For pain.           Follow-up Information    Please follow up. (can been seen by orthopedic doctor if does not improve with PT)           The results of significant diagnostics from this hospitalization (including imaging, microbiology, ancillary and laboratory) are listed below for reference.    Significant Diagnostic Studies: Dg Chest 1 View  06/20/2012  *RADIOLOGY REPORT*  Clinical Data: Preoperative respiratory film.  CHEST - 1 VIEW  Comparison: None.  Findings: The lungs are clear.  No pneumothorax or pleural fluid. Heart size is normal.  No focal bony abnormality.  IMPRESSION: No acute disease.  Original Report Authenticated By: Bernadene Bell. D'ALESSIO, M.D.   Dg Hip Complete Left  06/20/2012  *RADIOLOGY REPORT*  Clinical Data: Status post fall.  Left hip pain.  LEFT HIP - COMPLETE 2+ VIEW  Comparison: Plain films right hip 04/02/2005.  Findings: Bones are osteopenic.  Hips are located.  No fracture is identified.  Degenerative disease of the symphysis pubis is noted.  IMPRESSION: No acute finding.  Osteopenia.  If clinical concern for fracture persists, MRI without contrast would be useful for further evaluation.  Original Report Authenticated By: Bernadene Bell. Maricela Curet, M.D.   Mr Hip Left Wo Contrast  06/20/2012  *RADIOLOGY REPORT*  Clinical Data: Left hip pain after injury.  MRI OF THE LEFT HIP WITHOUT CONTRAST  Technique:  Multiplanar, multisequence MR imaging was performed. No intravenous contrast was administered.  Comparison: 06/20/2012 radiographs  Findings: Acutely avulsed left hamstring tendon noted on image 10 of series 7, with surrounding fluid signal intensity and edema which also tracks  adjacent to the left sciatic nerve.  No impingement of the sciatic notch.  A band of edema tracks in the proximal left semitendinosus muscle.  No fracture or significant abnormal osseous edema noted.  No hip joint effusion observed.  There is tendinopathy and chronic appearing partial tearing of the right hamstring tendon.  On coronal images there is a suggestion of considerable lumbar spondylosis, query pars defects at L5.  IMPRESSION:  1.  Acute avulsion of the left hamstring tendon, with surrounding edema.  A band of edema also tracks within the proximal left semitendinosus muscle. 2.  Chronic tendinopathy and chronic appearing partial tearing of the right hamstring tendon. 3.  Lower lumbar spondylosis, query pars defects at L5.  Original Report Authenticated By: Dellia Cloud, M.D.   Dg Knee Complete 4 Views Left  06/20/2012  *RADIOLOGY REPORT*  Clinical Data: Fall, pain.  LEFT KNEE - COMPLETE 4+ VIEW  Comparison: None.  Findings: No fracture or dislocation is identified.  Trace amount of joint fluid is noted.  Minimal degenerative change is present about the knee.  Bones appear osteopenic.  IMPRESSION: No acute abnormality.  Original Report Authenticated By: Bernadene Bell. Maricela Curet, M.D.    Microbiology: No results found for this or any previous visit (from the past 240 hour(s)).   Labs: Basic Metabolic Panel:  Lab 06/21/12 4540 06/20/12 0830  NA 138 134*  K 3.6 3.9  CL 106 98  CO2 25 23  GLUCOSE 83 132*  BUN 10 14  CREATININE 0.87 0.83  CALCIUM 8.3* 9.0  MG -- --  PHOS -- --   Liver Function Tests: No results found for this basename: AST:5,ALT:5,ALKPHOS:5,BILITOT:5,PROT:5,ALBUMIN:5 in the last 168 hours No results found for this basename: LIPASE:5,AMYLASE:5 in the last 168 hours No results found for this basename: AMMONIA:5 in the last 168 hours CBC:  Lab 06/21/12 0458 06/20/12 0830  WBC 6.2 7.0  NEUTROABS -- 4.6  HGB 12.4 13.6  HCT 36.9 39.3  MCV 88.9 88.1  PLT 169 191     Cardiac Enzymes: No results found for this basename: CKTOTAL:5,CKMB:5,CKMBINDEX:5,TROPONINI:5 in the last 168 hours BNP: BNP (last 3 results) No results found for this basename: PROBNP:3 in the last 8760 hours CBG: No results found for this basename: GLUCAP:5 in the last 168 hours  Time coordinating discharge: 45 minutes  Signed:  Marlin Canary  [facility] 06/23/2012, 12:57 PM

## 2012-06-23 NOTE — Progress Notes (Signed)
Physical Therapy Treatment Patient Details Name: Tara Nolan MRN: [id number] DOB: 24-Mar-1919 Today's Date: 06/23/2012 Time: 4098-1191 PT Time Calculation (min): 9 min  PT Assessment / Plan / Recommendation Comments on Treatment Session  COntinued good progress with mobility. Min assist for sit to stand and to pivot to bed with RW, able to toe touch WB thru LLE in standing with RW.  Pt eager to progress with mobility.  Noted MRI of L knee pending.    Follow Up Recommendations  Skilled nursing facility;Supervision/Assistance - 24 hour    Barriers to Discharge        Equipment Recommendations  Defer to next venue    Recommendations for Other Services    Frequency Min 5X/week   Plan Discharge plan remains appropriate;Frequency remains appropriate    Precautions / Restrictions Precautions Precautions: None Restrictions Weight Bearing Restrictions: No   Pertinent Vitals/Pain *10/10 with movement, premedicated**    Mobility  Bed Mobility Bed Mobility: Sit to Supine Supine to Sit: 4: Min assist Supine to Sit: Patient Percentage: 90% Details for Bed Mobility Assistance: assist to support LLE Transfers Transfers: Sit to Stand;Stand to Sit;Stand Pivot Transfers Sit to Stand: With upper extremity assist;4: Min guard;From chair/3-in-1 Sit to Stand: Patient Percentage: 90% Stand to Sit: 4: Min guard;With upper extremity assist;To chair/3-in-1 Stand to Sit: Patient Percentage: 90% Stand Pivot Transfers: 4: Min assist Stand Pivot Transfers: Patient Percentage: 90% Details for Transfer Assistance: Pt pivoted on RLE using RW, recliner to bed.Pt able to toe touch WB through LLE with RW.  Ambulation/Gait Ambulation/Gait Assistance: Not tested (comment)    Exercises    PT Diagnosis:    PT Problem List:   PT Treatment Interventions:     PT Goals Acute Rehab PT Goals PT Goal Formulation: With patient/family Time For Goal Achievement: 06/28/12 Potential to Achieve Goals:  Fair Pt will go Supine/Side to Sit: with supervision PT Goal: Supine/Side to Sit - Progress: Updated due to goal met Pt will go Sit to Stand: with supervision PT Goal: Sit to Stand - Progress: Progressing toward goal Pt will Transfer Bed to Chair/Chair to Bed: with supervision PT Transfer Goal: Bed to Chair/Chair to Bed - Progress: Progressing toward goal Pt will Ambulate: 1 - 15 feet;with min assist  Visit Information  Last PT Received On: 06/23/12 Assistance Needed: +1    Subjective Data  Subjective: I've been doing the exercises! Patient Stated Goal: be able to walk   Cognition  Overall Cognitive Status: Appears within functional limits for tasks assessed/performed Arousal/Alertness: Awake/alert Orientation Level: Appears intact for tasks assessed Behavior During Session: [facility] for tasks performed    Balance  Balance Balance Assessed: Yes Static Sitting Balance Static Sitting - Balance Support: Bilateral upper extremity supported;Feet supported Static Sitting - Level of Assistance: 6: Modified independent (Device/Increase time)  End of Session PT - End of Session Equipment Utilized During Treatment: Gait belt Activity Tolerance: Patient tolerated treatment well Patient left: with call bell/phone within reach;in bed;with family/visitor present Nurse Communication: Mobility status   GP     Tara Nolan 06/23/2012, 11:36 AM

## 2012-06-23 NOTE — Progress Notes (Signed)
Physical Therapy Treatment Patient Details Name: Tara Nolan MRN: [id number] DOB: 03/09/1919 Today's Date: 06/23/2012 Time: 4098-1191 PT Time Calculation (min): 32 min  PT Assessment / Plan / Recommendation Comments on Treatment Session  Pt is progressing well with mobility. Min assist for supine to sit and sit to stand, and to pivot with RW to chair. Pt was able to touch L toes to floor in standing with RW today, previously unable to do so 2* pain. But unable to WB thru LLE. Instructed in LE exercises, issued handout. Pt eager to progress with strengthening.  Noted MRI of knee ordered today.     Follow Up Recommendations  Skilled nursing facility;Supervision/Assistance - 24 hour    Barriers to Discharge        Equipment Recommendations  Defer to next venue    Recommendations for Other Services    Frequency Min 5X/week   Plan Discharge plan remains appropriate;Frequency remains appropriate    Precautions / Restrictions Precautions Precautions: None Restrictions Weight Bearing Restrictions: No   Pertinent Vitals/Pain *10/10 L posterior hip with activity Pt premedicated with Tylenol**    Mobility  Bed Mobility Supine to Sit: 4: Min assist;HOB elevated Supine to Sit: Patient Percentage: 70% Details for Bed Mobility Assistance: assist to support LLE Transfers Transfers: Sit to Stand;Stand to Sit;Stand Pivot Transfers Sit to Stand: 4: Min assist;From bed;With upper extremity assist Sit to Stand: Patient Percentage: 90% Stand to Sit: 4: Min guard;With upper extremity assist;To chair/3-in-1 Stand to Sit: Patient Percentage: 90% Stand Pivot Transfers: 4: Min assist Stand Pivot Transfers: Patient Percentage: 80% Details for Transfer Assistance: Pt pivoted on RLE using RW, bed to chair. Pt stood for 90 sec with RW and was able to touch L toes to floor today, previously she was unable to do so 2* pain. Still not able to WB through LLE 2* pain.   Ambulation/Gait Ambulation/Gait Assistance: Not tested (comment)    Exercises Total Joint Exercises Towel Squeeze: AROM;Both;10 reps;Seated General Exercises - Lower Extremity Ankle Circles/Pumps: AROM;Both;10 reps;Seated Quad Sets: AROM;Both;10 reps Short Arc Quad: AROM;Left;10 reps;Supine Long Arc Quad: AAROM;Left;Seated;10 reps Heel Slides: AAROM;Left;Supine;15 reps Hip ABduction/ADduction: AAROM;Left;Supine;15 reps   PT Diagnosis:    PT Problem List:   PT Treatment Interventions:     PT Goals Acute Rehab PT Goals PT Goal Formulation: With patient/family Time For Goal Achievement: 06/28/12 Potential to Achieve Goals: Fair Pt will go Supine/Side to Sit: with supervision PT Goal: Supine/Side to Sit - Progress: Updated due to goal met Pt will go Sit to Stand: with supervision PT Goal: Sit to Stand - Progress: Updated due to goal met Pt will Transfer Bed to Chair/Chair to Bed: with supervision PT Transfer Goal: Bed to Chair/Chair to Bed - Progress: Updated due to goal met Pt will Ambulate: 1 - 15 feet;with min assist  Visit Information  Last PT Received On: 06/23/12 Assistance Needed: +1    Subjective Data  Subjective: I'm going to get this leg moving! Patient Stated Goal: be able to walk on L leg   Cognition  Overall Cognitive Status: Appears within functional limits for tasks assessed/performed Arousal/Alertness: Awake/alert Orientation Level: Appears intact for tasks assessed Behavior During Session: [facility] for tasks performed    Balance  Balance Balance Assessed: Yes Static Sitting Balance Static Sitting - Balance Support: Bilateral upper extremity supported;Feet supported Static Sitting - Level of Assistance: 6: Modified independent (Device/Increase time)  End of Session PT - End of Session Equipment Utilized During Treatment: Gait belt Activity Tolerance: Patient  tolerated treatment well Patient left: in chair;with call bell/phone within reach Nurse  Communication: Mobility status   GP     Ralene Bathe Kistler 06/23/2012, 10:17 AM [phone]

## 2012-06-23 NOTE — Progress Notes (Signed)
Physical Therapy Treatment Patient Details Name: Tara Nolan MRN: [id number] DOB: 02/13/1919 Today's Date: 06/23/2012 Time: 1308-6578 PT Time Calculation (min): 10 min  PT Assessment / Plan / Recommendation Comments on Treatment Session  MRI results noted, meniscus tear L knee. Trial of standing with knee immobilizer and RW, x2 this session. Pt able to stand with left foot flat for 45 sec x2. Attempted ambulation, but pt unable to advance RLE with KI and RW.     Follow Up Recommendations  Skilled nursing facility;Supervision/Assistance - 24 hour    Barriers to Discharge        Equipment Recommendations  Defer to next venue    Recommendations for Other Services    Frequency Min 5X/week   Plan Discharge plan remains appropriate;Frequency remains appropriate    Precautions / Restrictions Precautions Precautions: None Restrictions Weight Bearing Restrictions: No   Pertinent Vitals/Pain *9/10 L hip **    Mobility  Bed Mobility Bed Mobility: Supine to Sit Supine to Sit: 4: Min assist Supine to Sit: Patient Percentage: 90% Details for Bed Mobility Assistance: assist to support LLE Transfers Transfers: Sit to Stand;Stand to Sit Sit to Stand: With upper extremity assist;4: Min guard;From bed Sit to Stand: Patient Percentage: 90% Stand to Sit: 4: Min guard;With upper extremity assist;To bed Stand to Sit: Patient Percentage: 90% Stand Pivot Transfers: 4: Min assist Stand Pivot Transfers: Patient Percentage: 90% Details for Transfer Assistance: Spoke with Dr Benjamine Mola regarding MRI results, showing meniscus tear. REcommended KI to assist with WBing in standing. Demonstrated application of KI to pt/family. Pt stood with RW for 45 sec with KI with L foot flat, x 2 trials.  Ambulation/Gait Ambulation/Gait Assistance: Not tested (comment)    Exercises     PT Diagnosis:    PT Problem List:   PT Treatment Interventions:     PT Goals Acute Rehab PT Goals PT Goal Formulation:  With patient/family Time For Goal Achievement: 06/28/12 Potential to Achieve Goals: Fair Pt will go Supine/Side to Sit: with supervision PT Goal: Supine/Side to Sit - Progress: Progressing toward goal Pt will go Sit to Stand: with supervision PT Goal: Sit to Stand - Progress: Progressing toward goal Pt will Transfer Bed to Chair/Chair to Bed: with supervision PT Transfer Goal: Bed to Chair/Chair to Bed - Progress: Progressing toward goal Pt will Ambulate: 1 - 15 feet;with min assist  Visit Information  Last PT Received On: 06/23/12 Assistance Needed: +1    Subjective Data  Subjective: I have the brace and want to try it.  Patient Stated Goal: to walk   Cognition  Overall Cognitive Status: Appears within functional limits for tasks assessed/performed Arousal/Alertness: Awake/alert Orientation Level: Appears intact for tasks assessed Behavior During Session: [facility] for tasks performed    Balance  Balance Balance Assessed: Yes Static Sitting Balance Static Sitting - Balance Support: Bilateral upper extremity supported;Feet supported Static Sitting - Level of Assistance: 6: Modified independent (Device/Increase time)  End of Session PT - End of Session Equipment Utilized During Treatment: Gait belt Activity Tolerance: Patient tolerated treatment well Patient left: with call bell/phone within reach;in bed;with family/visitor present Nurse Communication: Mobility status   GP     Ralene Bathe Kistler 06/23/2012, 2:45 PM

## 2017-09-07 ENCOUNTER — Emergency Department (HOSPITAL_COMMUNITY): Payer: Medicare Other

## 2017-09-07 ENCOUNTER — Encounter (HOSPITAL_COMMUNITY): Payer: Self-pay | Admitting: Emergency Medicine

## 2017-09-07 ENCOUNTER — Inpatient Hospital Stay (HOSPITAL_COMMUNITY)
Admission: EM | Admit: 2017-09-07 | Discharge: 2017-09-12 | DRG: 312 | Disposition: A | Discharge status: Skilled Nursing Facility | Payer: Medicare Other | Attending: Family Medicine | Admitting: Family Medicine

## 2017-09-07 DIAGNOSIS — Z8673 Personal history of transient ischemic attack (TIA), and cerebral infarction without residual deficits: Secondary | ICD-10-CM

## 2017-09-07 DIAGNOSIS — Z90710 Acquired absence of both cervix and uterus: Secondary | ICD-10-CM

## 2017-09-07 DIAGNOSIS — M25511 Pain in right shoulder: Secondary | ICD-10-CM | POA: Diagnosis present

## 2017-09-07 DIAGNOSIS — I10 Essential (primary) hypertension: Secondary | ICD-10-CM | POA: Diagnosis present

## 2017-09-07 DIAGNOSIS — R55 Syncope and collapse: Secondary | ICD-10-CM | POA: Diagnosis not present

## 2017-09-07 DIAGNOSIS — Z66 Do not resuscitate: Secondary | ICD-10-CM | POA: Diagnosis present

## 2017-09-07 DIAGNOSIS — R0789 Other chest pain: Secondary | ICD-10-CM | POA: Diagnosis present

## 2017-09-07 DIAGNOSIS — Z8639 Personal history of other endocrine, nutritional and metabolic disease: Secondary | ICD-10-CM

## 2017-09-07 DIAGNOSIS — W19XXXA Unspecified fall, initial encounter: Secondary | ICD-10-CM | POA: Diagnosis present

## 2017-09-07 DIAGNOSIS — S62102A Fracture of unspecified carpal bone, left wrist, initial encounter for closed fracture: Secondary | ICD-10-CM | POA: Diagnosis present

## 2017-09-07 DIAGNOSIS — E890 Postprocedural hypothyroidism: Secondary | ICD-10-CM | POA: Diagnosis present

## 2017-09-07 LAB — BASIC METABOLIC PANEL
Anion gap: 12 (ref 5–15)
BUN: 9 mg/dL (ref 6–20)
CALCIUM: 8.9 mg/dL (ref 8.9–10.3)
CO2: 21 mmol/L — AB (ref 22–32)
Chloride: 102 mmol/L (ref 101–111)
Creatinine, Ser: 0.91 mg/dL (ref 0.44–1.00)
GFR calc Af Amer: >60 mL/min (ref >60)
GFR, EST NON AFRICAN AMERICAN: 52 mL/min — AB (ref >60)
Glucose, Bld: 113 mg/dL — ABNORMAL HIGH (ref 65–99)
POTASSIUM: 3.5 mmol/L (ref 3.5–5.1)
Sodium: 135 mmol/L (ref 135–145)

## 2017-09-07 LAB — CBC
HEMATOCRIT: 38.5 % (ref 36.0–46.0)
HEMOGLOBIN: 12.7 g/dL (ref 12.0–15.0)
MCH: 30 pg (ref 26.0–34.0)
MCHC: 33 g/dL (ref 30.0–36.0)
MCV: 90.8 fL (ref 78.0–100.0)
Platelets: 196 10*3/uL (ref 150–400)
RBC: 4.24 MIL/uL (ref 3.87–5.11)
RDW: 16.1 % — AB (ref 11.5–15.5)
WBC: 9.1 10*3/uL (ref 4.0–10.5)

## 2017-09-07 LAB — URINALYSIS, ROUTINE W REFLEX MICROSCOPIC
BACTERIA UA: NONE SEEN
Bilirubin Urine: NEGATIVE
Glucose, UA: NEGATIVE mg/dL
Hgb urine dipstick: NEGATIVE
Ketones, ur: 5 mg/dL — AB
Leukocytes, UA: NEGATIVE
NITRITE: NEGATIVE
PH: 8 (ref 5.0–8.0)
Protein, ur: 100 mg/dL — AB
SPECIFIC GRAVITY, URINE: 1.005 (ref 1.005–1.030)

## 2017-09-07 LAB — CBG MONITORING, ED: GLUCOSE-CAPILLARY: 115 mg/dL — AB (ref 65–99)

## 2017-09-07 LAB — I-STAT TROPONIN, ED: TROPONIN I, POC: 0.02 ng/mL (ref 0.00–0.08)

## 2017-09-07 MED ORDER — ACETAMINOPHEN 325 MG PO TABS
650.0000 mg | ORAL_TABLET | Freq: Once | ORAL | Status: AC
  Administered 2017-09-07: 650 mg via ORAL
  Filled 2017-09-07: qty 2

## 2017-09-07 NOTE — ED Provider Notes (Signed)
[facility]-EMERGENCY DEPT Provider Note   CSN: [id number] Arrival date & time: 09/07/17  2009     History   Chief Complaint Chief Complaint  Patient presents with  . Near Syncope    HPI Tara Nolan is a 83 y.o. female.  Patient had a fall today that she broke her left breast and saw orthopedics about. Then after that she started having chest pressure and passed out for 20 seconds   The history is provided by the patient. No language interpreter was used.  Near Syncope  This is a new problem. The current episode started less than 1 hour ago. The problem has been resolved. Associated symptoms include chest pain. Pertinent negatives include no abdominal pain and no headaches. Nothing relieves the symptoms. She has tried nothing for the symptoms. The treatment provided no relief.    Past Medical History:  Diagnosis Date  . Stroke (HCC)   . Thyroid disease     Patient Active Problem List   Diagnosis Date Noted  . Hip pain, acute 06/20/2012  . Knee pain 06/20/2012  . Hamstring tendon rupture 06/20/2012    Past Surgical History:  Procedure Laterality Date  . ABDOMINAL HYSTERECTOMY    . APPENDECTOMY    . HERNIA REPAIR    . THYROIDECTOMY      OB History    No data available       Home Medications    Prior to Admission medications   Medication Sig Start Date End Date Taking? Authorizing Provider  acetaminophen (TYLENOL) 500 MG tablet Take 125 mg by mouth every 6 (six) hours as needed. For pain.    [provider]    Family History No family history on file.  Social History Social History  Substance Use Topics  . Smoking status: Never Smoker  . Smokeless tobacco: Not on file  . Alcohol use No     Allergies   Patient has no known allergies.   Review of Systems Review of Systems  Constitutional: Negative for appetite change and fatigue.  HENT: Negative for congestion, ear discharge and sinus pressure.   Eyes: Negative for discharge.    Respiratory: Negative for cough.   Cardiovascular: Positive for chest pain and near-syncope.  Gastrointestinal: Negative for abdominal pain and diarrhea.  Genitourinary: Negative for frequency and hematuria.  Musculoskeletal: Negative for back pain.       Pain in right shoulder humerus and right knee.  Skin: Negative for rash.  Neurological: Negative for seizures and headaches.  Psychiatric/Behavioral: Negative for hallucinations.     Physical Exam Updated Vital Signs BP (!) 126/99   Pulse (!) 108   Temp 99.4 F (37.4 C) (Oral)   Resp 16   Ht 5' (1.524 m)   Wt 43.1 kg (95 lb)   SpO2 99%   BMI 18.55 kg/m   Physical Exam  Constitutional: She is oriented to person, place, and time. She appears well-developed.  HENT:  Head: Normocephalic.  Eyes: Conjunctivae and EOM are normal. No scleral icterus.  Neck: Neck supple. No thyromegaly present.  Cardiovascular: Normal rate and regular rhythm.  Exam reveals no gallop and no friction rub.   No murmur heard. Pulmonary/Chest: No stridor. She has no wheezes. She has no rales. She exhibits no tenderness.  Abdominal: She exhibits no distension. There is no tenderness. There is no rebound.  Musculoskeletal: Normal range of motion. She exhibits no edema.  Tenderness to right humerus and right knee. Patient for range of motion neurovascular exam normal.  Patient is placed on her left wrist fracture today  Lymphadenopathy:    She has no cervical adenopathy.  Neurological: She is oriented to person, place, and time. She exhibits normal muscle tone. Coordination normal.  Skin: No rash noted. No erythema.  Psychiatric: She has a normal mood and affect. Her behavior is normal.     ED Treatments / Results  Labs (all labs ordered are listed, but only abnormal results are displayed) Labs Reviewed  BASIC METABOLIC PANEL - Abnormal; Notable for the following:       Result Value   CO2 21 (*)    Glucose, Bld 113 (*)    GFR calc non Af Amer 52  (*)    All other components within normal limits  CBC - Abnormal; Notable for the following:    RDW 16.1 (*)    All other components within normal limits  URINALYSIS, ROUTINE W REFLEX MICROSCOPIC - Abnormal; Notable for the following:    Color, Urine STRAW (*)    Ketones, ur 5 (*)    Protein, ur 100 (*)    Squamous Epithelial / LPF 0-5 (*)    All other components within normal limits  CBG MONITORING, ED - Abnormal; Notable for the following:    Glucose-Capillary 115 (*)    All other components within normal limits  I-STAT TROPONIN, ED    EKG  EKG Interpretation  Date/Time:  Saturday September 07 2017 20:33:45 EDT Ventricular Rate:  106 PR Interval:    QRS Duration: 113 QT Interval:  375 QTC Calculation: 498 R Axis:   -64 Text Interpretation:  Sinus tachycardia Consider left atrial enlargement Left anterior fascicular block Abnormal R-wave progression, early transition LVH with secondary repolarization abnormality Anterior Q waves, possibly due to LVH Confirmed by Bethann Berkshire [phone]) on 09/07/2017 10:10:41 PM       Radiology Dg Chest 2 View  Result Date: 09/07/2017 CLINICAL DATA:  Larey Seat this morning. EXAM: CHEST  2 VIEW COMPARISON:  06/20/2012 FINDINGS: The lungs are clear except for mild chronic interstitial coarsening. No consolidation. No effusions. Hilar, mediastinal and cardiac contours are unremarkable and unchanged. IMPRESSION: No acute findings Electronically Signed   By: Ellery Plunk M.D.   On: 09/07/2017 21:51   Dg Knee Complete 4 Views Right  Result Date: 09/07/2017 CLINICAL DATA:  Persistent pain after falling this morning EXAM: RIGHT KNEE - COMPLETE 4+ VIEW COMPARISON:  None. FINDINGS: Negative for acute fracture or dislocation. No radiographic evidence of joint effusion. No acute soft tissue abnormality. IMPRESSION: Negative. Electronically Signed   By: Ellery Plunk M.D.   On: 09/07/2017 21:52   Dg Humerus Right  Result Date: 09/07/2017 CLINICAL  DATA:  Persistent right upper extremity pain after falling this morning EXAM: RIGHT HUMERUS - 2+ VIEW COMPARISON:  None. FINDINGS: There is no evidence of fracture or other focal bone lesions. Soft tissues are unremarkable. IMPRESSION: Negative. Electronically Signed   By: Ellery Plunk M.D.   On: 09/07/2017 21:51    Procedures Procedures (including critical care time)  Medications Ordered in ED Medications  acetaminophen (TYLENOL) tablet 650 mg (650 mg Oral Given 09/07/17 2242)     Initial Impression / Assessment and Plan / ED Course  I have reviewed the triage vital signs and the nursing notes.  Pertinent labs & imaging results that were available during my care of the patient were reviewed by me and considered in my medical decision making (see chart for details).     Patient will be admitted  for chest pain and have further troponins  Final Clinical Impressions(s) / ED Diagnoses   Final diagnoses:  Syncope and collapse    New Prescriptions New Prescriptions   No medications on file     Bethann Berkshire, MD 09/07/17 2246

## 2017-09-07 NOTE — ED Triage Notes (Signed)
BIB EMS from home, called out for near syncope and gen weakness. Pt had fall this AM and fx L wrist, throughout the day pt has felt fatigue and weak. Pt was not sedated or has taken any pain medicine throughout the day. Pt is A/O4, hypertensive en route. CBG WNL

## 2017-09-07 NOTE — ED Notes (Signed)
Pt requesting pain medicine, EDP aware 

## 2017-09-07 NOTE — ED Notes (Signed)
Pt family also informed this RN that pt had chest pressure earlier in the day with SOB. EDP aware.

## 2017-09-08 ENCOUNTER — Encounter (HOSPITAL_COMMUNITY): Payer: Self-pay | Admitting: *Deleted

## 2017-09-08 DIAGNOSIS — M25511 Pain in right shoulder: Secondary | ICD-10-CM | POA: Diagnosis not present

## 2017-09-08 DIAGNOSIS — S62102A Fracture of unspecified carpal bone, left wrist, initial encounter for closed fracture: Secondary | ICD-10-CM | POA: Diagnosis present

## 2017-09-08 DIAGNOSIS — R55 Syncope and collapse: Secondary | ICD-10-CM | POA: Diagnosis present

## 2017-09-08 DIAGNOSIS — I10 Essential (primary) hypertension: Secondary | ICD-10-CM | POA: Diagnosis present

## 2017-09-08 DIAGNOSIS — W19XXXA Unspecified fall, initial encounter: Secondary | ICD-10-CM | POA: Diagnosis not present

## 2017-09-08 DIAGNOSIS — E890 Postprocedural hypothyroidism: Secondary | ICD-10-CM | POA: Diagnosis present

## 2017-09-08 DIAGNOSIS — Z8639 Personal history of other endocrine, nutritional and metabolic disease: Secondary | ICD-10-CM | POA: Diagnosis not present

## 2017-09-08 DIAGNOSIS — I340 Nonrheumatic mitral (valve) insufficiency: Secondary | ICD-10-CM | POA: Diagnosis not present

## 2017-09-08 DIAGNOSIS — R0789 Other chest pain: Secondary | ICD-10-CM | POA: Diagnosis present

## 2017-09-08 DIAGNOSIS — Z66 Do not resuscitate: Secondary | ICD-10-CM | POA: Diagnosis present

## 2017-09-08 DIAGNOSIS — G44209 Tension-type headache, unspecified, not intractable: Secondary | ICD-10-CM | POA: Diagnosis not present

## 2017-09-08 DIAGNOSIS — I351 Nonrheumatic aortic (valve) insufficiency: Secondary | ICD-10-CM | POA: Diagnosis not present

## 2017-09-08 DIAGNOSIS — Z8673 Personal history of transient ischemic attack (TIA), and cerebral infarction without residual deficits: Secondary | ICD-10-CM | POA: Diagnosis not present

## 2017-09-08 DIAGNOSIS — Z90710 Acquired absence of both cervix and uterus: Secondary | ICD-10-CM | POA: Diagnosis not present

## 2017-09-08 LAB — CBC
HCT: 36.1 % (ref 36.0–46.0)
HEMOGLOBIN: 12.1 g/dL (ref 12.0–15.0)
MCH: 30.5 pg (ref 26.0–34.0)
MCHC: 33.5 g/dL (ref 30.0–36.0)
MCV: 90.9 fL (ref 78.0–100.0)
PLATELETS: 183 10*3/uL (ref 150–400)
RBC: 3.97 MIL/uL (ref 3.87–5.11)
RDW: 15.8 % — ABNORMAL HIGH (ref 11.5–15.5)
WBC: 6.6 10*3/uL (ref 4.0–10.5)

## 2017-09-08 LAB — TSH: TSH: 40.177 u[IU]/mL — AB (ref 0.350–4.500)

## 2017-09-08 LAB — TROPONIN I
TROPONIN I: 0.03 ng/mL — AB (ref <0.03)
TROPONIN I: 0.03 ng/mL — AB (ref <0.03)
Troponin I: 0.04 ng/mL (ref <0.03)

## 2017-09-08 MED ORDER — ACETAMINOPHEN 325 MG PO TABS
650.0000 mg | ORAL_TABLET | Freq: Four times a day (QID) | ORAL | Status: DC | PRN
  Administered 2017-09-08 – 2017-09-11 (×4): 650 mg via ORAL
  Filled 2017-09-08 (×4): qty 2

## 2017-09-08 MED ORDER — ACETAMINOPHEN 500 MG PO TABS: 250.0000 mg | ORAL_TABLET | Freq: Four times a day (QID) | ORAL | Status: DC | PRN

## 2017-09-08 MED ORDER — ACETAMINOPHEN 325 MG PO TABS: 650.0000 mg | ORAL_TABLET | Freq: Once | ORAL | Status: DC

## 2017-09-08 MED ORDER — ONDANSETRON HCL 4 MG PO TABS
4.0000 mg | ORAL_TABLET | Freq: Four times a day (QID) | ORAL | Status: DC | PRN
Start: 2017-09-08 — End: 2017-09-12

## 2017-09-08 MED ORDER — LEVOTHYROXINE SODIUM 25 MCG PO TABS
25.0000 ug | ORAL_TABLET | Freq: Every day | ORAL | Status: DC
  Administered 2017-09-08: 25 ug via ORAL
  Filled 2017-09-08: qty 1

## 2017-09-08 MED ORDER — SODIUM CHLORIDE 0.9 % IV SOLN
INTRAVENOUS | Status: DC
  Administered 2017-09-08 – 2017-09-11 (×4): via INTRAVENOUS

## 2017-09-08 MED ORDER — ENOXAPARIN SODIUM 30 MG/0.3ML ~~LOC~~ SOLN
30.0000 mg | Freq: Every day | SUBCUTANEOUS | Status: DC
  Administered 2017-09-08 – 2017-09-12 (×5): 30 mg via SUBCUTANEOUS
  Filled 2017-09-08 (×6): qty 0.3

## 2017-09-08 MED ORDER — ONDANSETRON HCL 4 MG/2ML IJ SOLN: 4.0000 mg | Freq: Four times a day (QID) | INTRAMUSCULAR | Status: DC | PRN

## 2017-09-08 MED ORDER — ENSURE ENLIVE PO LIQD
237.0000 mL | Freq: Two times a day (BID) | ORAL | Status: DC
  Administered 2017-09-08 – 2017-09-11 (×5): 237 mL via ORAL
  Filled 2017-09-08: qty 237

## 2017-09-08 MED ORDER — TRAZODONE HCL 50 MG PO TABS: 50.0000 mg | ORAL_TABLET | Freq: Every evening | ORAL | Status: DC | PRN

## 2017-09-08 NOTE — H&P (Signed)
History and Physical    Tara Nolan ZOX:[id number] DOB: 15-Nov-1919 DOA: 09/07/2017  Referring MD/NP/PA: Dr. Aileen Pilot PCP: Patient, No Pcp Per  Patient coming from: home via EMS  Chief Complaint: passed out  HPI: Tara Nolan is a 83 y.o. female with medical history significant of CVA and thyroid disease; who presents after passing out at home. History is obtained from the patient her son at bedside. Patient reports that she was cleaning up things around her house history morning and fell. Initially she complained of pain in her left wrist, right shoulder, and knee. She was evaluated and found to have a acute left wrist fracture for which she was seen received a splint by orthopedics. Patient was discharge as initially able to walk, but but since being home reported worsening pain. Patient reports severe pain in her right knee for which it gave way and she went down on the floor.she had to call for her son to come pick it up. Subsequently patient was noted to have a short episodes of passing out. Patient would lose consciousness for only a few seconds. At one time patient slumped out of her chair.  She thinks symptoms may be related to her pain. Previously she reports syncopal episodes usually go related to thyroid medicine for which she self stopped the medication and reports symptoms resolved. Patient reports having diffuse pain but states only being able to take Tylenol as she has multiple side effects to any other medication or cream.  At one point patient complained of shortness of breath and discomfort like something was sitting on her chest and family called EMS.   ED Course: Upon admission to the emergency department patient was noted to have a temperature of 99.33F,pulse 84-108, respirations 15-20, and all the vital signs maintained on room air. Labs were relatively unremarkable. Urinalysis is negative for any signs of infection. Chest x-ray showed no acute findings. X-rays of the  right shoulder and right knee show no acute abnormalities. She denies having any chest pain or discomfort at this time.  Review of Systems  Constitutional: Positive for malaise/fatigue. Negative for chills and fever.  HENT: Negative for ear discharge and nosebleeds.   Eyes: Negative for pain and discharge.  Respiratory: Positive for shortness of breath.   Cardiovascular: Positive for chest pain. Negative for leg swelling.  Gastrointestinal: Negative for abdominal pain, diarrhea, nausea and vomiting.  Genitourinary: Negative for dysuria and frequency.  Musculoskeletal: Positive for falls, joint pain and myalgias.  Skin: Positive for rash. Negative for itching.  Neurological: Positive for loss of consciousness. Negative for focal weakness.  Psychiatric/Behavioral: Negative for substance abuse and suicidal ideas.    Past Medical History:  Diagnosis Date  . Stroke (HCC)   . Thyroid disease     Past Surgical History:  Procedure Laterality Date  . ABDOMINAL HYSTERECTOMY    . APPENDECTOMY    . HERNIA REPAIR    . THYROIDECTOMY       reports that she has never smoked. She does not have any smokeless tobacco history on file. She reports that she does not drink alcohol or use drugs.  Allergies  Allergen Reactions  . Other Other (See Comments)    All medications make me bonkers     No family history on file.  Prior to Admission medications   Medication Sig Start Date End Date Taking? Authorizing Provider  acetaminophen (TYLENOL) 500 MG tablet Take 125 mg by mouth every 6 (six) hours as needed. For pain.  Yes [provider]    Physical Exam:  Constitutional: Elderly  female NAD, calm, comfortable Vitals:   09/07/17 2330 09/08/17 0000 09/08/17 0100 09/08/17 0230  BP: 119/79 100/67 117/76 124/72  Pulse: 95 85 84 84  Resp: Temp:      TempSrc:      SpO2: 96% 96% 95% 96%  Weight:      Height:       Eyes: PERRL, lids and conjunctivae normal ENMT:  Mucous membranes are moist. Posterior pharynx clear of any exudate or lesions. Neck: normal, supple, no masses, no thyromegaly Respiratory: clear to auscultation bilaterally, no wheezing, no crackles. Normal respiratory effort. No accessory muscle use.  Cardiovascular: Regular rate and rhythm, no murmurs / rubs / gallops. No extremity edema. 2+ pedal pulses. No carotid bruits.  Abdomen: no tenderness, no masses palpated. No hepatosplenomegaly. Bowel sounds positive.  Musculoskeletal: no clubbing / cyanosis. No joint deformity upper and lower extremities. Left wrist in splint. Decreased ROM of right shoulder. Skin: no rashes, lesions, ulcers. No induration Neurologic: CN 2-12 grossly intact. Sensation intact, DTR normal. Strength 5/5 in all 4.  Psychiatric: Normal judgment and insight. Alert and oriented x 3. Normal mood.     Labs on Admission: I have personally reviewed following labs and imaging studies  CBC:  Recent Labs Lab 09/07/17 2020  WBC 9.1  HGB 12.7  HCT 38.5  MCV 90.8  PLT 196   Basic Metabolic Panel:  Recent Labs Lab 09/07/17 2020  NA 135  K 3.5  CL 102  CO2 21*  GLUCOSE 113*  BUN 9  CREATININE 0.91  CALCIUM 8.9   GFR: Estimated Creatinine Clearance: 25.2 mL/min (by C-G formula based on SCr of 0.91 mg/dL). Liver Function Tests: No results for input(s): AST, ALT, ALKPHOS, BILITOT, PROT, ALBUMIN in the last 168 hours. No results for input(s): LIPASE, AMYLASE in the last 168 hours. No results for input(s): AMMONIA in the last 168 hours. Coagulation Profile: No results for input(s): INR, PROTIME in the last 168 hours. Cardiac Enzymes: No results for input(s): CKTOTAL, CKMB, CKMBINDEX, TROPONINI in the last 168 hours. BNP (last 3 results) No results for input(s): PROBNP in the last 8760 hours. HbA1C: No results for input(s): HGBA1C in the last 72 hours. CBG:  Recent Labs Lab 09/07/17 2040  GLUCAP 115*   Lipid Profile: No results for input(s): CHOL,  HDL, LDLCALC, TRIG, CHOLHDL, LDLDIRECT in the last 72 hours. Thyroid Function Tests: No results for input(s): TSH, T4TOTAL, FREET4, T3FREE, THYROIDAB in the last 72 hours. Anemia Panel: No results for input(s): VITAMINB12, FOLATE, FERRITIN, TIBC, IRON, RETICCTPCT in the last 72 hours. Urine analysis:    Component Value Date/Time   COLORURINE STRAW (A) 09/07/2017 2020   APPEARANCEUR CLEAR 09/07/2017 2020   LABSPEC 1.005 09/07/2017 2020   PHURINE 8.0 09/07/2017 2020   GLUCOSEU NEGATIVE 09/07/2017 2020   HGBUR NEGATIVE 09/07/2017 2020   BILIRUBINUR NEGATIVE 09/07/2017 2020   KETONESUR 5 (A) 09/07/2017 2020   PROTEINUR 100 (A) 09/07/2017 2020   NITRITE NEGATIVE 09/07/2017 2020   LEUKOCYTESUR NEGATIVE 09/07/2017 2020   Sepsis Labs: No results found for this or any previous visit (from the past 240 hour(s)).   Radiological Exams on Admission: Dg Chest 2 View  Result Date: 09/07/2017 CLINICAL DATA:  Larey Seat this morning. EXAM: CHEST  2 VIEW COMPARISON:  06/20/2012 FINDINGS: The lungs are clear except for mild chronic interstitial coarsening. No consolidation. No effusions. Hilar, mediastinal and cardiac contours  are unremarkable and unchanged. IMPRESSION: No acute findings Electronically Signed   By: Ellery Plunk M.D.   On: 09/07/2017 21:51   Dg Knee Complete 4 Views Right  Result Date: 09/07/2017 CLINICAL DATA:  Persistent pain after falling this morning EXAM: RIGHT KNEE - COMPLETE 4+ VIEW COMPARISON:  None. FINDINGS: Negative for acute fracture or dislocation. No radiographic evidence of joint effusion. No acute soft tissue abnormality. IMPRESSION: Negative. Electronically Signed   By: Ellery Plunk M.D.   On: 09/07/2017 21:52   Dg Humerus Right  Result Date: 09/07/2017 CLINICAL DATA:  Persistent right upper extremity pain after falling this morning EXAM: RIGHT HUMERUS - 2+ VIEW COMPARISON:  None. FINDINGS: There is no evidence of fracture or other focal bone lesions. Soft  tissues are unremarkable. IMPRESSION: Negative. Electronically Signed   By: Ellery Plunk M.D.   On: 09/07/2017 21:51    EKG: Independently reviewed. Sinus tachycardia at 106 bpm  Assessment/Plan Syncope : acute. Patient reportedly had multiple syncopal episodes with the course of the day even in the emergency department. Possibly related to pain and vagal response. - admit to telemetry bed - Check orthostatic vital signs - follow-up telemetry overnight - heckechocardiogram - determine if further workup and/or cardiology consult warrented  Falls:Patient reports having a fall while cleaning up. Patient reports subsequent falls due to right knee giving way due to pain. - physical therapy to eval and treat  Right shoulder pain, right knee pain, and left wrist fracture: Patient status post splint of the left wrist. X-ray imaging studies do not show any acute fractures of the shoulder or knee. - occupational therapy as patient lives alone - Tylenol when necessary pain - patient declines any cream or patch  Chest discomfort: Patient at some point complained of chest pressure. Initial EKG without any ischemic changes and initial troponin negative. - trend troponins   H/O thyroid disease: Patient reports having partial thyroidectomy,but stopped thyroid replacement medications over 6 years ago. - check TSH   DVT prophylaxis: lovenox Code Status: DNR Family Communication:Discussed plan of care with patient and family present at bedside. Disposition Plan: TBD Consults called: none Admission status: observation  Clydie Braun MD [facility] Pager [phone]   If 7PM-7AM, please contact night-coverage [email or web address] Password [email or web address]  09/08/2017, 3:15 AM

## 2017-09-08 NOTE — Progress Notes (Signed)
Seen and evaluated at ED, awaiting bed. Admitted today. H& P reviewed and noted. She is chest pain free, no sob. Labs indicate trop 0.4 with elevated THS> 40. Exam unchanged from admit. Start synthroid, agree to cardiac w/u and pain support with OT

## 2017-09-09 ENCOUNTER — Inpatient Hospital Stay (HOSPITAL_COMMUNITY): Payer: Medicare Other

## 2017-09-09 DIAGNOSIS — I340 Nonrheumatic mitral (valve) insufficiency: Secondary | ICD-10-CM

## 2017-09-09 DIAGNOSIS — I351 Nonrheumatic aortic (valve) insufficiency: Secondary | ICD-10-CM

## 2017-09-09 LAB — ECHOCARDIOGRAM COMPLETE
HEIGHTINCHES: 60 in
Weight: 1692.8 oz

## 2017-09-09 LAB — TROPONIN I: Troponin I: <0.03 ng/mL (ref <0.03)

## 2017-09-09 LAB — T4, FREE: FREE T4: 0.63 ng/dL (ref 0.61–1.12)

## 2017-09-09 MED ORDER — LEVOTHYROXINE SODIUM 25 MCG PO TABS
25.0000 ug | ORAL_TABLET | Freq: Every day | ORAL | Status: DC
  Administered 2017-09-09: 25 ug via ORAL
  Filled 2017-09-09: qty 1

## 2017-09-09 MED ORDER — SODIUM CHLORIDE 0.9 % IV BOLUS (SEPSIS)
1000.0000 mL | Freq: Once | INTRAVENOUS | Status: AC
  Administered 2017-09-09: 1000 mL via INTRAVENOUS

## 2017-09-09 NOTE — Progress Notes (Signed)
Initial Nutrition Assessment  DOCUMENTATION CODES:   Not applicable  INTERVENTION:   -Continue Ensure Enlive po BID, each supplement provides 350 kcal and 20 grams of protein  NUTRITION DIAGNOSIS:   Increased nutrient needs related to acute illness as evidenced by estimated needs.  GOAL:   Patient will meet greater than or equal to 90% of their needs  MONITOR:   PO intake, Supplement acceptance, Labs, Weight trends, Skin, I & O's  REASON FOR ASSESSMENT:   Malnutrition Screening Tool    ASSESSMENT:   Tara Nolan is a 83 y.o. female with medical history significant of CVA and thyroid disease; who presents after passing out at home  Pt admitted with acute syncope.   Pt working with therapies x 3 during attempted visits. Unable to perform nutrition-focused physical exam or obtain further nutrition hx at this time. No recent wt hx available to assess wt changes.   Per MST report, pt complains of weight loss and poor appetite PTA. She is consuming Ensure supplements, which RD will continue.   Labs reviewed.   Diet Order:  Diet regular Room service appropriate? Yes; Fluid consistency: Thin  Skin:  Reviewed, no issues  Last BM:  09/08/17  Height:   Ht Readings from Last 1 Encounters:  09/08/17 5' (1.524 m)    Weight:   Wt Readings from Last 1 Encounters:  09/08/17 105 lb 12.8 oz (48 kg)    Ideal Body Weight:  45.5 kg  BMI:  Body mass index is 20.66 kg/m.  Estimated Nutritional Needs:   Kcal:  1200-1400  Protein:  50-65 grams  Fluid:  1.2-1.4 L  EDUCATION NEEDS:   No education needs identified at this time  Cleola Perryman A. Mayford Knife, RD, LDN, CDE Pager: [phone] After hours Pager: [phone]

## 2017-09-09 NOTE — Evaluation (Signed)
Occupational Therapy Evaluation Patient Details Name: Tara Nolan MRN: [id number] DOB: 06-15-1919 Today's Date: 09/09/2017    History of Present Illness   83 y.o. female with medical history significant of CVA and thyroid disease; who presents after passing out at home. Initially she complained of pain in her left wrist, right shoulder, and knee. She was evaluated and found to have a acute left wrist fracture for which she was seen received a splint by orthopedics. Continues to report worsening R knee pain.    Clinical Impression   PTA, pt was living alone and performing BADLs. Pt family and friends assisting with IADLs such as meal preparation. Pt currently requiring Mod A for UB ADLs and Max A for LB ADLs. Pt requiring Min A +2 for stand pivot transfer to recliner. RN present for transfer to recliner due to syncope episodes in AM; pt BP in bed 149/85 and 162/88 after transfer. Pt would benefit from further acute OT to facilitate safe dc. Recommend dc to SNF for further OT to increase safety and independence with ADLs and functional mobility before returning home.     Follow Up Recommendations  SNF    Equipment Recommendations  Hospital bed;Other (comment) (Defer to next venue)    Recommendations for Other Services PT consult     Precautions / Restrictions Precautions Precautions: Fall Restrictions Weight Bearing Restrictions: Yes Other Position/Activity Restrictions: No formal orders. LUE casted from upper arm to fingers; edema in fingers      Mobility Bed Mobility Overal bed mobility: Needs Assistance Bed Mobility: Supine to Sit     Supine to sit: Min assist;HOB elevated     General bed mobility comments: Min A to transition hips with bed pad when at EOB. Pt using elevated HOB and heavy pull on bed rails   Transfers Overall transfer level: Needs assistance Equipment used: 2 person hand held assist Transfers: Sit to/from [employer] Sit to Stand:  Min assist;+2 physical assistance Stand pivot transfers: Min assist;+2 physical assistance       General transfer comment: Min A to power up into standing. +2 for safety due to pt syncope earlier today. Pt with no syncopic episode and reports only slight dizziness    Balance Overall balance assessment: Needs assistance Sitting-balance support: No upper extremity supported;Feet supported Sitting balance-Leahy Scale: Fair     Standing balance support: Bilateral upper extremity supported;During functional activity Standing balance-Leahy Scale: Poor Standing balance comment: Requires UE support                           ADL either performed or assessed with clinical judgement   ADL Overall ADL's : Needs assistance/impaired Eating/Feeding: Set up;Sitting   Grooming: Set up;Sitting   Upper Body Bathing: Moderate assistance;Sitting   Lower Body Bathing: Maximal assistance;Sit to/from stand   Upper Body Dressing : Moderate assistance;Sitting   Lower Body Dressing: Maximal assistance;Sit to/from stand   Toilet Transfer: Minimal assistance;+2 for physical assistance;Stand-pivot (Simulated to recliner)           Functional mobility during ADLs: Minimal assistance (Two hand held A) General ADL Comments: Pt demonstrating decreased functional performance. RN present during transfer due to syncope with changes in position. Pt BP sitting in bed 149/85 and 162/88 after transfer to recliner. Pt reporting slight dizziness in standing. Provided education on edema management through massage, elevation, and finger movement.       Vision  Perception     Praxis      Pertinent Vitals/Pain Pain Assessment: Faces Faces Pain Scale: Hurts little more Pain Location: R shoulder, R knee, L arm Pain Descriptors / Indicators: Constant;Discomfort;Grimacing;Guarding Pain Intervention(s): Monitored during session;Limited activity within patient's tolerance;Repositioned      Hand Dominance Right   Extremity/Trunk Assessment Upper Extremity Assessment Upper Extremity Assessment: LUE deficits/detail LUE Deficits / Details: Casted due to wrist fx. edema in fingers LUE: Unable to fully assess due to immobilization LUE Coordination: decreased fine motor;decreased gross motor   Lower Extremity Assessment Lower Extremity Assessment: Defer to PT evaluation   Cervical / Trunk Assessment Cervical / Trunk Assessment: Kyphotic   Communication Communication Communication: HOH   Cognition Arousal/Alertness: Awake/alert Behavior During Therapy: WFL for tasks assessed/performed Overall Cognitive Status: Within Functional Limits for tasks assessed                                     General Comments  Son present throughout session    Exercises     Shoulder Instructions      Home Living Family/patient expects to be discharged to:: Skilled nursing facility Living Arrangements: Alone Available Help at Discharge: Family;Available PRN/intermittently Type of Home: House Home Access: Stairs to enter [employer] of Steps: 1   Home Layout: One level     Bathroom Shower/Tub: Chief Strategy Officer: Standard     Home Equipment: Environmental consultant - 2 wheels          Prior Functioning/Environment Level of Independence: Needs assistance  Gait / Transfers Assistance Needed: Uses a RW for funcitonal mobility ADL's / Homemaking Assistance Needed: Assistance for IADLs including meal preparation and cleaning. Pt able to perform bathing and dressing            OT Problem List: Decreased range of motion;Decreased strength;Decreased activity tolerance;Impaired balance (sitting and/or standing);Decreased safety awareness;Decreased knowledge of use of DME or AE;Decreased knowledge of precautions;Impaired UE functional use      OT Treatment/Interventions: Self-care/ADL training;Therapeutic exercise;Energy conservation;DME and/or AE  instruction;Therapeutic activities;Patient/family education    OT Goals(Current goals can be found in the care plan section) Acute Rehab OT Goals Patient Stated Goal: Go home OT Goal Formulation: With patient/family Time For Goal Achievement: 09/23/17 Potential to Achieve Goals: Good  OT Frequency: Min 2X/week   Barriers to D/C:            Co-evaluation              AM-PAC PT "6 Clicks" Daily Activity     Outcome Measure Help from another person eating meals?: A Little Help from another person taking care of personal grooming?: A Little Help from another person toileting, which includes using toliet, bedpan, or urinal?: A Little Help from another person bathing (including washing, rinsing, drying)?: A Lot Help from another person to put on and taking off regular upper body clothing?: A Little Help from another person to put on and taking off regular lower body clothing?: A Lot 6 Click Score: 16   End of Session Equipment Utilized During Treatment: Gait belt Nurse Communication: Mobility status;Other (comment) (BP)  Activity Tolerance: Patient tolerated treatment well Patient left: in chair;with call bell/phone within reach;with family/visitor present  OT Visit Diagnosis: Unsteadiness on feet (R26.81);Other abnormalities of gait and mobility (R26.89);Muscle weakness (generalized) (M62.81);History of falling (Z91.81)  Time: 1454-1530 OT Time Calculation (min): 36 min Charges:  OT General Charges $OT Visit: 1 Visit OT Evaluation $OT Eval Moderate Complexity: 1 Mod OT Treatments $Self Care/Home Management : 8-22 mins G-Codes:     Jonothan Heberle MSOT, OTR/L Acute Rehab Pager: [phone] Office: [phone]  Theodoro Grist Haze [place] 09/09/2017, 4:10 PM

## 2017-09-09 NOTE — Progress Notes (Signed)
PT Cancellation Note  Patient Details Name: Tara Nolan MRN: [id number] DOB: [birth year]   Cancelled Treatment:    Reason Eval/Treat Not Completed: Medical issues which prohibited therapy.  Pt was getting a bolus of fluid, and nurse asked PT to wait for her therapy, pending completion to see if this affected her elevated BP which MD feels might be vascular stenosis from dehydration.  Will try again in the AM.   Ivar Drape 09/09/2017, 5:22 PM   Samul Dada, PT MS Acute Rehab Dept. Number: [facility] [id number] and [facility] [phone]

## 2017-09-09 NOTE — Progress Notes (Signed)
  Echocardiogram 2D Echocardiogram has been performed.  Leta Jungling M 09/09/2017, 2:11 PM

## 2017-09-09 NOTE — Progress Notes (Signed)
[facility] Progress Note  Patient: Tara Nolan ZOX:[id number]   PCP: Patient, No Pcp Per DOB: 06/10/1919   DOA: 09/07/2017   DOS: 09/09/2017   Date of Service: the patient was seen and examined on 09/09/2017  Subjective: 2 episodes of syncope this morning. No evidence of chest pain or abdominal pain. No nausea no vomiting. No shortness of breath. Prior to the episode as well as after the episode the patient was able to communicate without any difficulty and was aware of her surrounding.telemetry was unremarkable during the episode as well. Both episode occurred when the patient was changing her position.  Brief hospital course: Pt. with PMH of CVA, hypothyroidism; admitted on 09/07/2017, presented with complaint of passing out episode, was found to have yncope likely orthostatic. Currently further plan is continue IV hydration.  Assessment and Plan: 1. Syncope. Most likely orthostatic since the patient's fall are occurring due to change in the position. No evidence of vertigo or room spin. Neurologically no deficit. Echocardiogram also unremarkable. Continue aggressive IV hydration with normal saline bolus as well as IV fluid. Recheck orthostatic tomorrow PTOT consult. Monitor on telemetry.  2. Left upper extremity splint. Patient had a fall as an outpatient [facility]. Had a splint placed has swelling and bluish discoloration no pan no limitation in motion. Discussed with orthopedic attending on call, appreciate input, we will follow-up on the patient and loosen the splint.  3. Chest discomfort. Currently resolved. Monitor.  4. Hypothyroidism. Patient has a history of partial thyroidectomy, has not taken thyroid medication for last 6 years as they make her dizzy and she sometimes passes out. Patient thinks that this morning's episode were due to thyroid medication as well. Would hold it for now.  Diet: cardic diwet DVT  Prophylaxis: subcutaneous Heparin  Advance goals of care discussion: DNR DNI  Family Communication: family was present at bedside, at the time of interview. The pt provided permission to discuss medical plan with the family. Opportunity was given to ask question and all questions were answered satisfactorily.   Disposition:  Discharge to be determined..  Consultants: none Procedures: none  Antibiotics: Anti-infectives    None       Objective: Physical Exam: Vitals:   09/09/17 1500 09/09/17 1534 09/09/17 1643 09/09/17 1842  BP: (!) 162/88 (!) 146/85 (!) 146/83 (!) 141/77  Pulse:  97    Resp:  18    Temp:  98.6 F (37 C)    TempSrc:  Oral    SpO2:  97%    Weight:      Height:        Intake/Output Summary (Last 24 hours) at 09/09/17 1908 Last data filed at 09/09/17 1542  Gross per 24 hour  Intake           1967.5 ml  Output              450 ml  Net           1517.5 ml   Filed Weights   09/07/17 2018 09/08/17 1125  Weight: 43.1 kg (95 lb) 48 kg (105 lb 12.8 oz)   General: Alert, Awake and Oriented to Time, Place and Person. Appear in mild distress, affect appropriate Eyes: PERRL, Conjunctiva normal ENT: Oral Mucosa clear moist. Neck: no JVD, no Abnormal Mass Or lumps Cardiovascular: S1 and S2 Present, no Murmur, Peripheral Pulses Present Respiratory: normal respiratory effort, Bilateral Air entry equal and Decreased, no use of accessory muscle,  Clear to Auscultation, no Crackles, no wheezes Abdomen: Bowel Sound present, Soft and no tenderness, no hernia Skin: no redness, no Rash, no induration Extremities: no Pedal edema, no calf tenderness Neurologic: Grossly no focal neuro deficit. Bilaterally Equal motor strength  Data Reviewed: CBC:  Recent Labs Lab 09/07/17 2020 09/08/17 0337  WBC 9.1 6.6  HGB 12.7 12.1  HCT 38.5 36.1  MCV 90.8 90.9  PLT 196 183   Basic Metabolic Panel:  Recent Labs Lab 09/07/17 2020  NA 135  K 3.5  CL 102  CO2 21*    GLUCOSE 113*  BUN 9  CREATININE 0.91  CALCIUM 8.9    Liver Function Tests: No results for input(s): AST, ALT, ALKPHOS, BILITOT, PROT, ALBUMIN in the last 168 hours. No results for input(s): LIPASE, AMYLASE in the last 168 hours. No results for input(s): AMMONIA in the last 168 hours. Coagulation Profile: No results for input(s): INR, PROTIME in the last 168 hours. Cardiac Enzymes:  Recent Labs Lab 09/08/17 0342 09/08/17 1144 09/08/17 1659 09/08/17 2335  TROPONINI 0.04* 0.03* 0.03* <0.03   BNP (last 3 results) No results for input(s): PROBNP in the last 8760 hours. CBG:  Recent Labs Lab 09/07/17 2040  GLUCAP 115*   Studies: No results found.  Scheduled Meds: . enoxaparin (LOVENOX) injection  30 mg Subcutaneous Daily  . feeding supplement (ENSURE ENLIVE)  237 mL Oral BID BM  . levothyroxine  25 mcg Oral QAC breakfast   Continuous Infusions: . sodium chloride 75 mL/hr at 09/08/17 2225   PRN Meds: acetaminophen, ondansetron **OR** ondansetron (ZOFRAN) IV, traZODone  Time spent: 35 minutes  Author: Lynden Oxford, MD [facility] Pager: [phone] 09/09/2017 7:08 PM  If 7PM-7AM, please contact night-coverage at [email or web address], password [facility]

## 2017-09-09 NOTE — Progress Notes (Signed)
Orthostatic blood pressure Sitting 169/74 Laying 167/69 Sitting 176/80 Standing 181/83 Sitting syncopal episode 192/103 Laying during syncopal episode 187/91 Provider made aware

## 2017-09-09 NOTE — Progress Notes (Signed)
Patient had an episode off syncope when being moved up in the bed. MD notified. MD ordered orthostatic BP to be taken.

## 2017-09-09 NOTE — Progress Notes (Signed)
PT Cancellation Note  Patient Details Name: Tara Nolan MRN: [id number] DOB: 1919/07/18   Cancelled Treatment:    Reason Eval/Treat Not Completed: Medical issues which prohibited therapy.  High BP readings with pt experiencing syncope 1-2 minutes from bedside sitting.  Will try later as time and pt allow.   Ivar Drape 09/09/2017, 11:03 AM   Samul Dada, PT MS Acute Rehab Dept. Number: [facility] [id number] and [facility] [phone]

## 2017-09-10 LAB — COMPREHENSIVE METABOLIC PANEL
ALT: 11 U/L — AB (ref 14–54)
AST: 24 U/L (ref 15–41)
Albumin: 3 g/dL — ABNORMAL LOW (ref 3.5–5.0)
Alkaline Phosphatase: 40 U/L (ref 38–126)
Anion gap: 6 (ref 5–15)
BUN: 5 mg/dL — AB (ref 6–20)
CHLORIDE: 113 mmol/L — AB (ref 101–111)
CO2: 20 mmol/L — ABNORMAL LOW (ref 22–32)
CREATININE: 0.65 mg/dL (ref 0.44–1.00)
Calcium: 7.5 mg/dL — ABNORMAL LOW (ref 8.9–10.3)
GFR calc Af Amer: >60 mL/min (ref >60)
Glucose, Bld: 83 mg/dL (ref 65–99)
Potassium: 3.4 mmol/L — ABNORMAL LOW (ref 3.5–5.1)
Sodium: 139 mmol/L (ref 135–145)
Total Bilirubin: 0.6 mg/dL (ref 0.3–1.2)
Total Protein: 5.4 g/dL — ABNORMAL LOW (ref 6.5–8.1)

## 2017-09-10 LAB — CBC WITH DIFFERENTIAL/PLATELET
Basophils Absolute: 0 10*3/uL (ref 0.0–0.1)
Basophils Relative: 1 %
EOS ABS: 0.3 10*3/uL (ref 0.0–0.7)
EOS PCT: 5 %
HCT: 31.2 % — ABNORMAL LOW (ref 36.0–46.0)
Hemoglobin: 10.6 g/dL — ABNORMAL LOW (ref 12.0–15.0)
LYMPHS ABS: 1.4 10*3/uL (ref 0.7–4.0)
LYMPHS PCT: 26 %
MCH: 31.2 pg (ref 26.0–34.0)
MCHC: 34 g/dL (ref 30.0–36.0)
MCV: 91.8 fL (ref 78.0–100.0)
MONO ABS: 0.5 10*3/uL (ref 0.1–1.0)
MONOS PCT: 10 %
Neutro Abs: 3.2 10*3/uL (ref 1.7–7.7)
Neutrophils Relative %: 58 %
PLATELETS: 166 10*3/uL (ref 150–400)
RBC: 3.4 MIL/uL — AB (ref 3.87–5.11)
RDW: 16.7 % — ABNORMAL HIGH (ref 11.5–15.5)
WBC: 5.4 10*3/uL (ref 4.0–10.5)

## 2017-09-10 LAB — MAGNESIUM: MAGNESIUM: 1.8 mg/dL (ref 1.7–2.4)

## 2017-09-10 MED ORDER — LEVOTHYROXINE SODIUM 25 MCG PO TABS
12.5000 ug | ORAL_TABLET | Freq: Every day | ORAL | Status: DC
  Administered 2017-09-10 – 2017-09-12 (×3): 12.5 ug via ORAL
  Filled 2017-09-10 (×3): qty 1

## 2017-09-10 MED ORDER — POTASSIUM CHLORIDE CRYS ER 20 MEQ PO TBCR
40.0000 meq | EXTENDED_RELEASE_TABLET | Freq: Once | ORAL | Status: AC
  Administered 2017-09-10: 40 meq via ORAL
  Filled 2017-09-10: qty 2

## 2017-09-10 NOTE — NC FL2 (Signed)
[place] MEDICAID FL2 LEVEL OF CARE SCREENING TOOL     IDENTIFICATION  Patient Name: Tara Nolan Birthdate: Apr 24, 1919 Sex: female Admission Date (Current Location): 09/07/2017  [facility] and [place] Number:  Producer, television/film/video and Address:  [facility] [place], [address] [place], [place] [address]      Provider Number: [id number]  Attending Physician Name and Address:  Rolly Salter, MD  Relative Name and Phone Number:  Elijah Birk, son, [phone]    Current Level of Care: Hospital Recommended Level of Care: Skilled Nursing Facility Prior Approval Number:    Date Approved/Denied:   PASRR Number: [id number]  Discharge Plan: SNF    Current Diagnoses: Patient Active Problem List   Diagnosis Date Noted  . Syncope 09/08/2017  . Fall 09/08/2017  . Right shoulder pain 09/08/2017  . Left wrist fracture 09/08/2017  . History of thyroid disease 09/08/2017  . Chest discomfort 09/08/2017  . Hip pain, acute 06/20/2012  . Knee pain 06/20/2012  . Hamstring tendon rupture 06/20/2012    Orientation RESPIRATION BLADDER Height & Weight     Self, Time, Situation, Place  Normal Continent Weight: 48 kg (105 lb 12.8 oz) Height:  5' (152.4 cm)  BEHAVIORAL SYMPTOMS/MOOD NEUROLOGICAL BOWEL NUTRITION STATUS      Continent Diet (Please see DC Summary)  AMBULATORY STATUS COMMUNICATION OF NEEDS Skin   Limited Assist Verbally Normal                       Personal Care Assistance Level of Assistance  Bathing, Feeding, Dressing Bathing Assistance: Maximum assistance Feeding assistance: Independent Dressing Assistance: Limited assistance     Functional Limitations Info             SPECIAL CARE FACTORS FREQUENCY  PT (By licensed PT), OT (By licensed OT)     PT Frequency: 5x/week OT Frequency: 3x/week            Contractures      Additional Factors Info  Code Status, Allergies Code Status Info: DNR Allergies Info: Other            Current Medications (09/10/2017):  This is the current hospital active medication list Current Facility-Administered Medications  Medication Dose Route Frequency Provider Last Rate Last Dose  . 0.9 %  sodium chloride infusion   Intravenous Continuous Rolly Salter, MD 75 mL/hr at 09/10/17 1250    . acetaminophen (TYLENOL) tablet 650 mg  650 mg Oral Q6H PRN Madelyn Flavors A, MD   650 mg at 09/09/17 2251  . enoxaparin (LOVENOX) injection 30 mg  30 mg Subcutaneous Daily Katrinka Blazing, Rondell A, MD   30 mg at 09/10/17 0844  . feeding supplement (ENSURE ENLIVE) (ENSURE ENLIVE) liquid 237 mL  237 mL Oral BID BM Smith, Rondell A, MD   237 mL at 09/10/17 1126  . levothyroxine (SYNTHROID, LEVOTHROID) tablet 12.5 mcg  12.5 mcg Oral QAC breakfast Rolly Salter, MD   12.5 mcg at 09/10/17 0840  . ondansetron (ZOFRAN) tablet 4 mg  4 mg Oral Q6H PRN Madelyn Flavors A, MD       Or  . ondansetron (ZOFRAN) injection 4 mg  4 mg Intravenous Q6H PRN Smith, Rondell A, MD      . traZODone (DESYREL) tablet 50 mg  50 mg Oral QHS PRN Jackie Plum, MD         Discharge Medications: Please see discharge summary for a list of discharge medications.  Relevant  Imaging Results:  Relevant Lab Results:   Additional Information SSN: [id number] [place], [place]

## 2017-09-10 NOTE — Progress Notes (Signed)
[facility] Progress Note  Patient: Tara Nolan [id number]:[id number]   PCP: Patient, No Pcp Per DOB: 1919-01-15   DOA: 09/07/2017   DOS: 09/10/2017   Date of Service: the patient was seen and examined on 09/10/2017  Subjective: syncopal episodes are less frequent this morning. Only one episode. No nausea no vomiting. Tolerating oral diet. Her splint in the left arm has been losing yesterday by orthopedics. No nausea no vomiting.  Brief hospital course: Pt. with PMH of CVA, hypothyroidism; admitted on 09/07/2017, presented with complaint of passing out episode, was found to have yncope likely orthostatic. Currently further plan is continue IV hydration.  Assessment and Plan: 1. Syncope. Most likely orthostatic since the patient's fall are occurring due to change in the position. No evidence of vertigo or room spin. Neurologically no deficit. Echocardiogram also unremarkable. PTOT recommends SNF. Continue IV hydration.  2. Left upper extremity splint. Patient had a fall as an outpatient [facility]. Had a splint placed has swelling and bluish discoloration no pan no limitation in motion. Discussed with orthopedic attending on call, appreciate input, eft arm splint has been losing. Swelling has improved as well as pain per patient. Follow-up as an outpatient.  3. Chest discomfort. Currently resolved. Monitor.  4. Hypothyroidism. Patient has a history of partial thyroidectomy, has not taken thyroid medication for last 6 years as they make her dizzy and she sometimes passes out. Patient agreeable to take low-dose Synthroid for now.  Diet: cardiac diet DVT Prophylaxis: subcutaneous Heparin  Advance goals of care discussion: DNR DNI  Family Communication: family was present at bedside, at the time of interview. The pt provided permission to discuss medical plan with the family. Opportunity was given to ask question and all questions  were answered satisfactorily.   Disposition:  Discharge to SNF tomorrow  Consultants: none Procedures: none  Antibiotics: Anti-infectives    None       Objective: Physical Exam: Vitals:   09/09/17 1842 09/09/17 2236 09/10/17 0605 09/10/17 1450  BP: (!) 141/77 (!) 156/69 (!) 155/76 (!) 146/90  Pulse:  86 72 96  Resp:  Temp:   98 F (36.7 C) 98.4 F (36.9 C)  TempSrc:   Oral Oral  SpO2:  98% 99% 99%  Weight:      Height:        Intake/Output Summary (Last 24 hours) at 09/10/17 1919 Last data filed at 09/10/17 1300  Gross per 24 hour  Intake           1347.5 ml  Output             1650 ml  Net           -302.5 ml   Filed Weights   09/07/17 2018 09/08/17 1125  Weight: 43.1 kg (95 lb) 48 kg (105 lb 12.8 oz)   General: Alert, Awake and Oriented to Time, Place and Person. Appear in mild distress, affect appropriate Eyes: PERRL, Conjunctiva normal ENT: Oral Mucosa clear moist. Neck: no JVD, no Abnormal Mass Or lumps Cardiovascular: S1 and S2 Present, no Murmur, Peripheral Pulses Present Respiratory: normal respiratory effort, Bilateral Air entry equal and Decreased, no use of accessory muscle, Clear to Auscultation, no Crackles, no wheezes Abdomen: Bowel Sound present, Soft and no tenderness, no hernia Skin: no redness, no Rash, no induration Extremities: no Pedal edema, no calf tenderness Neurologic: Grossly no focal neuro deficit. Bilaterally Equal motor strength  Data Reviewed: CBC:  Recent Labs Lab 09/07/17 2020 09/08/17 0337 09/10/17 0413  WBC 9.1 6.6 5.4  NEUTROABS  --   --  3.2  HGB 12.7 12.1 10.6*  HCT 38.5 36.1 31.2*  MCV 90.8 90.9 91.8  PLT 196 183 166   Basic Metabolic Panel:  Recent Labs Lab 09/07/17 2020 09/10/17 0413  NA 135 139  K 3.5 3.4*  CL 102 113*  CO2 21* 20*  GLUCOSE 113* 83  BUN 9 5*  CREATININE 0.91 0.65  CALCIUM 8.9 7.5*  MG  --  1.8    Liver Function Tests:  Recent Labs Lab 09/10/17 0413  AST 24  ALT  11*  ALKPHOS 40  BILITOT 0.6  PROT 5.4*  ALBUMIN 3.0*   No results for input(s): LIPASE, AMYLASE in the last 168 hours. No results for input(s): AMMONIA in the last 168 hours. Coagulation Profile: No results for input(s): INR, PROTIME in the last 168 hours. Cardiac Enzymes:  Recent Labs Lab 09/08/17 0342 09/08/17 1144 09/08/17 1659 09/08/17 2335  TROPONINI 0.04* 0.03* 0.03* <0.03   BNP (last 3 results) No results for input(s): PROBNP in the last 8760 hours. CBG:  Recent Labs Lab 09/07/17 2040  GLUCAP 115*   Studies: No results found.  Scheduled Meds: . enoxaparin (LOVENOX) injection  30 mg Subcutaneous Daily  . feeding supplement (ENSURE ENLIVE)  237 mL Oral BID BM  . levothyroxine  12.5 mcg Oral QAC breakfast   Continuous Infusions: . sodium chloride 75 mL/hr at 09/10/17 1250   PRN Meds: acetaminophen, ondansetron **OR** ondansetron (ZOFRAN) IV, traZODone  Time spent: 35 minutes  Author: Lynden Oxford, MD [facility] Pager: [phone] 09/10/2017 7:19 PM  If 7PM-7AM, please contact night-coverage at [email or web address], password [facility]

## 2017-09-10 NOTE — Progress Notes (Signed)
Physical Therapy Evaluation Patient Details Name: Tara Nolan MRN: [id number] DOB: [birth year] Today's Date: 09/10/2017   History of Present Illness  83 y.o. female with medical history significant of CVA and thyroid disease; who presents after passing out at home. Has had several syncopal episodes since admission. She was evaluated and found to have a acute left wrist fracture for which she was seen received a splint by orthopedics. Continues to report R knee and R UE pain since fall.   Clinical Impression  Patient presents with the above. Evaluated patient and noted findings below. Patient lives alone and has family assistance intermittently. At this time patient presents with generalized weakness and instability while walking requiring mod assist. Feel this patient is unsafe to d/c home alone at this time and she would benefit from further rehabilitation at SNF to address deficits and maximize safe, functional independence before returning home. PT will continue to follow acutely and progress as tolerated.   Of note: Orthostatic vitals were recorded. Lying 181/103, HR 88; Sitting 188/95, HR 93; standing 0 min 171/75 HR 87; standing 3 min 154/95, HR 105    Follow Up Recommendations SNF;Supervision/Assistance - 24 hour    Equipment Recommendations   (TBD)    Recommendations for Other Services       Precautions / Restrictions Precautions Precautions: Fall Restrictions Weight Bearing Restrictions: Yes Other Position/Activity Restrictions: No formal orders. LUE casted from upper arm to fingers. Avoided WB throughout session      Mobility  Bed Mobility Overal bed mobility: Needs Assistance Bed Mobility: Supine to Sit;Sit to Supine     Supine to sit: Supervision Sit to supine: Supervision   General bed mobility comments: increased time and effort to achieve sitting EOB; use of rails but no other physical assistance required.  Transfers Overall transfer level: Needs  assistance Equipment used: None Transfers: Sit to/from Stand Sit to Stand: Min assist         General transfer comment: Pt able to scoot hips forward, use of bed rails and min A to power up. No syncopal episodes and only report of mild dizziness in standing  Ambulation/Gait Ambulation/Gait assistance: Mod assist Ambulation Distance (Feet): 10 Feet Assistive device: None Gait Pattern/deviations: Drifts right/left;Narrow base of support;Shuffle;Decreased stride length;Decreased step length - left;Decreased step length - right Gait velocity: decreased   General Gait Details: decreased speed overall and demonstrates instability requiring mod assist to correct and for overall stability.  Stairs            Wheelchair Mobility    Modified Rankin (Stroke Patients Only)       Balance Overall balance assessment: Needs assistance Sitting-balance support: No upper extremity supported;Feet supported Sitting balance-Leahy Scale: Good     Standing balance support: During functional activity;No upper extremity supported Standing balance-Leahy Scale: Fair Standing balance comment: able to maintain static standing without UE support while collecting orthostatic vitals; demonstrates mildly increased sway in standing                             Pertinent Vitals/Pain Pain Assessment: Faces Faces Pain Scale: Hurts little more Pain Location: R shoulder, R knee, L arm Pain Descriptors / Indicators: Constant;Discomfort;Sore Pain Intervention(s): Monitored during session    Home Living Family/patient expects to be discharged to:: Skilled nursing facility Living Arrangements: Alone Available Help at Discharge: Friend(s);Family;Available PRN/intermittently Type of Home: House Home Access: Stairs to enter Entrance Stairs-Rails: None Entrance Stairs-Number of Steps: 1 Home  Layout: One level Home Equipment: Shower seat;Grab bars - tub/shower;Walker - 4 wheels;Cane - single  point      Prior Function Level of Independence: Needs assistance   Gait / Transfers Assistance Needed: reports using rollator for functional mobility  ADL's / Homemaking Assistance Needed: Assistance for IADLs including meal preparation and cleaning. Pt able to perform bathing and dressing        Hand Dominance   Dominant Hand: Right    Extremity/Trunk Assessment   Upper Extremity Assessment Upper Extremity Assessment: Defer to OT evaluation    Lower Extremity Assessment Lower Extremity Assessment: Generalized weakness    Cervical / Trunk Assessment Cervical / Trunk Assessment: Kyphotic  Communication   Communication: HOH (slight HOH)  Cognition Arousal/Alertness: Awake/alert Behavior During Therapy: WFL for tasks assessed/performed Overall Cognitive Status: Within Functional Limits for tasks assessed                                        General Comments General comments (skin integrity, edema, etc.): Daughter present throughout session. No syncopal episodes this session. Orthostatic vitals were recorded. Lying 181/103, HR 88; Sitting 188/95, HR 93; standing 0 min 171/75 HR 87; standing 3 min 154/95, HR 105    Exercises     Assessment/Plan    PT Assessment Patient needs continued PT services  PT Problem List Decreased mobility;Decreased balance;Decreased strength       PT Treatment Interventions DME instruction;Gait training;Stair training;Functional mobility training;Therapeutic activities;Therapeutic exercise;Balance training;Neuromuscular re-education;Patient/family education    PT Goals (Current goals can be found in the Care Plan section)  Acute Rehab PT Goals Patient Stated Goal: to get better and go home PT Goal Formulation: With patient Time For Goal Achievement: 09/24/17 Potential to Achieve Goals: Good    Frequency Min 3X/week   Barriers to discharge        Co-evaluation               AM-PAC PT "6 Clicks" Daily  Activity  Outcome Measure Difficulty turning over in bed (including adjusting bedclothes, sheets and blankets)?: A Little Difficulty moving from lying on back to sitting on the side of the bed? : A Little Difficulty sitting down on and standing up from a chair with arms (e.g., wheelchair, bedside commode, etc,.)?: A Lot Help needed moving to and from a bed to chair (including a wheelchair)?: A Lot Help needed walking in hospital room?: A Lot Help needed climbing 3-5 steps with a railing? : Total 6 Click Score: 13    End of Session Equipment Utilized During Treatment: Gait belt Activity Tolerance: Patient tolerated treatment well Patient left: in bed;with bed alarm set;with call bell/phone within reach;with family/visitor present Nurse Communication: Mobility status PT Visit Diagnosis: Muscle weakness (generalized) (M62.81);Unsteadiness on feet (R26.81)    Time: 1610-9604 PT Time Calculation (min) (ACUTE ONLY): 27 min   Charges:   PT Evaluation $PT Eval Moderate Complexity: 1 Mod PT Treatments $Therapeutic Activity: 8-22 mins   PT G CodesMckinley Jewel, SPT [phone] office   Fonnie Birkenhead 09/10/2017, 1:14 PM

## 2017-09-11 ENCOUNTER — Inpatient Hospital Stay (HOSPITAL_COMMUNITY): Payer: Medicare Other

## 2017-09-11 DIAGNOSIS — Z8639 Personal history of other endocrine, nutritional and metabolic disease: Secondary | ICD-10-CM

## 2017-09-11 DIAGNOSIS — S62102A Fracture of unspecified carpal bone, left wrist, initial encounter for closed fracture: Secondary | ICD-10-CM

## 2017-09-11 DIAGNOSIS — G44209 Tension-type headache, unspecified, not intractable: Secondary | ICD-10-CM

## 2017-09-11 DIAGNOSIS — R55 Syncope and collapse: Principal | ICD-10-CM

## 2017-09-11 LAB — MAGNESIUM: MAGNESIUM: 1.8 mg/dL (ref 1.7–2.4)

## 2017-09-11 LAB — BASIC METABOLIC PANEL
Anion gap: 6 (ref 5–15)
BUN: 5 mg/dL — ABNORMAL LOW (ref 6–20)
CHLORIDE: 111 mmol/L (ref 101–111)
CO2: 21 mmol/L — AB (ref 22–32)
CREATININE: 0.62 mg/dL (ref 0.44–1.00)
Calcium: 7.8 mg/dL — ABNORMAL LOW (ref 8.9–10.3)
GFR calc Af Amer: >60 mL/min (ref >60)
GFR calc non Af Amer: >60 mL/min (ref >60)
Glucose, Bld: 84 mg/dL (ref 65–99)
Potassium: 3.3 mmol/L — ABNORMAL LOW (ref 3.5–5.1)
SODIUM: 138 mmol/L (ref 135–145)

## 2017-09-11 LAB — CBC
HCT: 35.4 % — ABNORMAL LOW (ref 36.0–46.0)
HEMOGLOBIN: 11.6 g/dL — AB (ref 12.0–15.0)
MCH: 30.4 pg (ref 26.0–34.0)
MCHC: 32.8 g/dL (ref 30.0–36.0)
MCV: 92.7 fL (ref 78.0–100.0)
Platelets: 177 10*3/uL (ref 150–400)
RBC: 3.82 MIL/uL — ABNORMAL LOW (ref 3.87–5.11)
RDW: 16.7 % — ABNORMAL HIGH (ref 11.5–15.5)
WBC: 5.3 10*3/uL (ref 4.0–10.5)

## 2017-09-11 LAB — GLUCOSE, CAPILLARY: Glucose-Capillary: 82 mg/dL (ref 65–99)

## 2017-09-11 MED ORDER — POTASSIUM CHLORIDE CRYS ER 20 MEQ PO TBCR
40.0000 meq | EXTENDED_RELEASE_TABLET | Freq: Once | ORAL | Status: AC
  Administered 2017-09-11: 40 meq via ORAL
  Filled 2017-09-11: qty 2

## 2017-09-11 NOTE — Progress Notes (Signed)
PROGRESS NOTE    Tara Nolan  ZOX:[id number] DOB: 1918/12/10 DOA: 09/07/2017 PCP: Patient, No Pcp Per   Brief Narrative: Tara Nolan is a 83 y.o. female with a history of CVA, hypothyroidism, wrist fracture that presented with syncope. Unknown etiology. No events on telemetry. Recurrent episode while inpatient per family and patient.    Assessment & Plan:   Principal Problem:   Syncope Active Problems:   Fall   Right shoulder pain   Left wrist fracture   History of thyroid disease   Chest discomfort   Syncope Unclear etiology. Patient continues to have syncopal episodes even when lying down. Does not appear to be heart related from review of telemetry strip. No evidence of hypoglycemia. Not consistent with seizure per history. ?narcolepsy/sleep disorder. No focal neurologic deficits to suggest stroke. Orthostatic vitals negative. Echo largely unremarkable. -cardiology consult -EEG -behavior modification to reduce risk of falls  Left wrist fracture Seen by orthopedic surgery as an outpatient. Currently has on a splint.  Chest discomfort Resolved. No evidence of ACS.  Hypothyroidism Patient has a history of partial thyroidectomy. TSH elevated secondary to voluntarily not taking her medication -continue Synthroid  Hypertension Significantly elevated. Patient states that even if we started blood pressure medication here in the hospital, she would not take it as an outpatient. She reports   Headache Likely secondary to elevated blood pressure.    DVT prophylaxis: Lovenox Code Status: DNR Family Communication: Son and daughter at bedside Disposition Plan: Discharge to SNF likely in 24 hours   Consultants:   Cardiology  Procedures:   Echocardiogram (09/10/17)  Antimicrobials:  None    Subjective: Patient reports bilateral headache described as a vice. No focal weakness. No facial droop. No paresthesia.   Objective: Vitals:   09/11/17  0556 09/11/17 0610 09/11/17 0837 09/11/17 1152  BP: (!) 176/155 (!) 150/92 (!) 195/99 124/80  Pulse: 79     Resp:      Temp:      TempSrc:      SpO2: 99%     Weight: 44.5 kg (98 lb)     Height:        Intake/Output Summary (Last 24 hours) at 09/11/17 1156 Last data filed at 09/11/17 0714  Gross per 24 hour  Intake          2108.33 ml  Output             2975 ml  Net          -866.67 ml   Filed Weights   09/07/17 2018 09/08/17 1125 09/11/17 0556  Weight: 43.1 kg (95 lb) 48 kg (105 lb 12.8 oz) 44.5 kg (98 lb)    Examination:  General exam: Appears calm and comfortable Respiratory system: Clear to auscultation. Respiratory effort normal. Cardiovascular system: S1 & S2 heard, RRR. No murmurs, rubs, gallops or clicks. Gastrointestinal system: Abdomen is nondistended, soft and nontender. No organomegaly or masses felt. Normal bowel sounds heard. Central nervous system: Alert and oriented. No focal neurological deficits. CN intact. Extremities: No edema. No calf tenderness. Left wrist in cast Skin: No cyanosis. No rashes Psychiatry: Judgement and insight appear normal. Mood & affect appropriate.     Data Reviewed: I have personally reviewed following labs and imaging studies  CBC:  Recent Labs Lab 09/07/17 2020 09/08/17 0337 09/10/17 0413 09/11/17 0649  WBC 9.1 6.6 5.4 5.3  NEUTROABS  --   --  3.2  --   HGB 12.7 12.1 10.6* 11.6*  HCT 38.5 36.1 31.2* 35.4*  MCV 90.8 90.9 91.8 92.7  PLT 196 183 166 177   Basic Metabolic Panel:  Recent Labs Lab 09/07/17 2020 09/10/17 0413 09/11/17 0649  NA 135 139 138  K 3.5 3.4* 3.3*  CL 102 113* 111  CO2 21* 20* 21*  GLUCOSE 113* 83 84  BUN 9 5* 5*  CREATININE 0.91 0.65 0.62  CALCIUM 8.9 7.5* 7.8*  MG  --  1.8 1.8   GFR: Estimated Creatinine Clearance: 29.6 mL/min (by C-G formula based on SCr of 0.62 mg/dL). Liver Function Tests:  Recent Labs Lab 09/10/17 0413  AST 24  ALT 11*  ALKPHOS 40  BILITOT 0.6  PROT  5.4*  ALBUMIN 3.0*   No results for input(s): LIPASE, AMYLASE in the last 168 hours. No results for input(s): AMMONIA in the last 168 hours. Coagulation Profile: No results for input(s): INR, PROTIME in the last 168 hours. Cardiac Enzymes:  Recent Labs Lab 09/08/17 0342 09/08/17 1144 09/08/17 1659 09/08/17 2335  TROPONINI 0.04* 0.03* 0.03* <0.03   BNP (last 3 results) No results for input(s): PROBNP in the last 8760 hours. HbA1C: No results for input(s): HGBA1C in the last 72 hours. CBG:  Recent Labs Lab 09/07/17 2040 09/11/17 0845  GLUCAP 115* 82   Lipid Profile: No results for input(s): CHOL, HDL, LDLCALC, TRIG, CHOLHDL, LDLDIRECT in the last 72 hours. Thyroid Function Tests:  Recent Labs  09/09/17 0839  FREET4 0.63   Anemia Panel: No results for input(s): VITAMINB12, FOLATE, FERRITIN, TIBC, IRON, RETICCTPCT in the last 72 hours. Sepsis Labs: No results for input(s): PROCALCITON, LATICACIDVEN in the last 168 hours.  No results found for this or any previous visit (from the past 240 hour(s)).       Radiology Studies: No results found.      Scheduled Meds: . enoxaparin (LOVENOX) injection  30 mg Subcutaneous Daily  . feeding supplement (ENSURE ENLIVE)  237 mL Oral BID BM  . levothyroxine  12.5 mcg Oral QAC breakfast   Continuous Infusions:   LOS: 3 days     Jacquelin Hawkingalph Merriam Brandner, MD [facility] 09/11/2017, 11:56 AM Pager: [phone]) [phone]  If 7PM-7AM, please contact night-coverage [email or web address] Password [email or web address] 09/11/2017, 11:56 AM

## 2017-09-11 NOTE — Progress Notes (Signed)
While attempting to administer Tylenol PO, the pt attempted to sit upright without assistance.  Upon rising, the pt had a syncopal episode that lasted about 10 seconds.  This nurse lowered the head of the pt's bed to the flat position until the pt regained consciousness.  This nurse then slowly raised the pt's HOB to 45 degrees to administer the tylenol.  The pt had no other adverse events.  Will continue to monitor pt.

## 2017-09-11 NOTE — Clinical Social Work Note (Signed)
Clinical Social Work Assessment  Patient Details  Name: Tara Nolan MRN: [id number] Date of Birth: 12-Apr-1919  Date of referral:  09/11/17               Reason for consult:  Facility Placement                Permission sought to share information with:  Facility Medical sales representativeContact Representative, Family Supports Permission granted to share information::  Yes, Verbal Permission Granted  Name::     Development worker, communityTom  Agency::  SNFs  Relationship::  Son  Contact Information:  [phone]  Housing/Transportation Living arrangements for the past 2 months:  Single Family Home Source of Information:  Patient, Adult Children Patient Interpreter Needed:  None Criminal Activity/Legal Involvement Pertinent to Current Situation/Hospitalization:  No - Comment as needed Significant Relationships:  Adult Children Lives with:  Self Do you feel safe going back to the place where you live?  No Need for family participation in patient care:  Yes (Comment)  Care giving concerns:  CSW received consult for possible SNF placement at time of discharge. CSW spoke with patient as well as her son and daughter regarding PT recommendation of SNF placement at time of discharge. Patient reported that she lives alone and given patient's current physical needs, she realizes she needs help. Patient expressed understanding of PT recommendation and is agreeable to SNF placement at time of discharge. CSW to continue to follow and assist with discharge planning needs.   Social Worker assessment / plan:  CSW spoke with patient [facility] before [facility].  Employment status:  Retired Health and safety inspectornsurance information:  Medicare PT Recommendations:  Skilled Nursing Facility Information / Referral to community resources:  Skilled Nursing Facility  Patient/Family's Response to care:  Patient recognizes need for rehab before returning home and [place] agree[place]. Patient and her son reported  preference for [facility]. She has been to [facility] a few years back and didn't enjoy it.   Patient/Family's Understanding of and Emotional Response to Diagnosis, Current Treatment, and Prognosis:  Patient/family is realistic regarding therapy needs and expressed being hopeful for SNF placement. Patient expressed understanding of CSW role and discharge process as well as medical condition. She understands that she cannot live alone right now. No questions/concerns about plan or treatment.    Emotional Assessment Appearance:  Appears stated age Attitude/Demeanor/Rapport:  Other (Appropriate; Pleasant) Affect (typically observed):  Accepting, Appropriate, Pleasant Orientation:  Oriented to Self, Oriented to  Time, Oriented to Place, Oriented to Situation Alcohol / Substance use:  Not Applicable Psych involvement (Current and /or in the community):  No (Comment)  Discharge Needs  Concerns to be addressed:  Care Coordination Readmission within the last 30 days:  No Current discharge risk:  None Barriers to Discharge:  Continued Medical Work up   [employer] S Tara Nolan, LCSWA 09/11/2017, 8:14 AM

## 2017-09-11 NOTE — Procedures (Signed)
ELECTROENCEPHALOGRAM REPORT  Date of Study: 09/11/2017  Patient's Name: Tara Nolan MRN: [id number] Date of Birth: June 23, 1919  Referring Provider: Dr. Jacquelin Hawkingalph Nettey  Clinical History: This is a 83 year old woman with syncope.  Medications: acetaminophen (TYLENOL) tablet 650 mg   enoxaparin (LOVENOX) injection 30 mg  feeding supplement (ENSURE ENLIVE) (ENSURE ENLIVE) liquid 237 mL  levothyroxine (SYNTHROID, LEVOTHROID) tablet 12.5 mcg  traZODone (DESYREL) tablet 50 mg   Technical Summary: A multichannel digital EEG recording measured by the international 10-20 system with electrodes applied with paste and impedances below 5000 ohms performed in our laboratory with EKG monitoring in an awake and drowsy patient.  Hyperventilation and photic stimulation were not performed.  The digital EEG was referentially recorded, reformatted, and digitally filtered in a variety of bipolar and referential montages for optimal display.    Description: The patient is awake and drowsy during the recording.  During maximal wakefulness, there is a symmetric, medium voltage 8 Hz posterior dominant rhythm that attenuates with eye opening.  The record is symmetric.  During drowsiness, there is an increase in theta slowing of the background.  Deeper stages of sleep were not seen. Hyperventilation and photic stimulation were not performed.  There were no epileptiform discharges or electrographic seizures seen.    EKG lead was unremarkable.  Impression: This awake and drowsy EEG is normal.    Clinical Correlation: A normal EEG does not exclude a clinical diagnosis of epilepsy. Clinical correlation is advised.   Patrcia DollyKaren Juliene Kirsh, M.D.
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
## 2017-09-11 NOTE — Progress Notes (Signed)
EEG completed; results pending.    

## 2017-09-12 ENCOUNTER — Encounter (HOSPITAL_COMMUNITY): Payer: Self-pay | Admitting: Radiology

## 2017-09-12 ENCOUNTER — Inpatient Hospital Stay (HOSPITAL_COMMUNITY): Payer: Medicare Other

## 2017-09-12 DIAGNOSIS — R0789 Other chest pain: Secondary | ICD-10-CM

## 2017-09-12 DIAGNOSIS — Z8673 Personal history of transient ischemic attack (TIA), and cerebral infarction without residual deficits: Secondary | ICD-10-CM

## 2017-09-12 LAB — BASIC METABOLIC PANEL
ANION GAP: 7 (ref 5–15)
BUN: 6 mg/dL (ref 6–20)
CO2: 21 mmol/L — AB (ref 22–32)
Calcium: 8.2 mg/dL — ABNORMAL LOW (ref 8.9–10.3)
Chloride: 107 mmol/L (ref 101–111)
Creatinine, Ser: 0.66 mg/dL (ref 0.44–1.00)
GFR calc Af Amer: >60 mL/min (ref >60)
GFR calc non Af Amer: >60 mL/min (ref >60)
GLUCOSE: 80 mg/dL (ref 65–99)
POTASSIUM: 4.1 mmol/L (ref 3.5–5.1)
Sodium: 135 mmol/L (ref 135–145)

## 2017-09-12 LAB — CBC
HEMATOCRIT: 33.7 % — AB (ref 36.0–46.0)
Hemoglobin: 11.4 g/dL — ABNORMAL LOW (ref 12.0–15.0)
MCH: 31.1 pg (ref 26.0–34.0)
MCHC: 33.8 g/dL (ref 30.0–36.0)
MCV: 92.1 fL (ref 78.0–100.0)
Platelets: 203 10*3/uL (ref 150–400)
RBC: 3.66 MIL/uL — AB (ref 3.87–5.11)
RDW: 16.2 % — ABNORMAL HIGH (ref 11.5–15.5)
WBC: 5.3 10*3/uL (ref 4.0–10.5)

## 2017-09-12 MED ORDER — ENSURE ENLIVE PO LIQD: 237.0000 mL | Freq: Two times a day (BID) | ORAL | 12 refills | Status: DC

## 2017-09-12 MED ORDER — LEVOTHYROXINE SODIUM 25 MCG PO TABS: 12.5000 ug | ORAL_TABLET | Freq: Every day | ORAL | Status: DC

## 2017-09-12 NOTE — Clinical Social Work Placement (Signed)
   CLINICAL SOCIAL WORK PLACEMENT  NOTE  Date:  09/12/2017  Patient Details  Name: Tara Nolan MRN: [id number] Date of Birth: 17-May-1919  Clinical Social Work is seeking post-discharge placement for this patient at the Skilled  Nursing Facility level of care (*CSW will initial, date and re-position this form in  chart as items are completed):  Yes   Patient/family provided with [facility] Clinical Social Work Department's list of facilities offering this level of care within the geographic area requested by the patient (or if unable, by the patient's family).  Yes   Patient/family informed of their freedom to choose among providers that offer the needed level of care, that participate in Medicare, Medicaid or managed care program needed by the patient, have an available bed and are willing to accept the patient.  Yes   Patient/family informed of [facility]'s ownership interest in [facility] and [facility], as well as of the fact that they are under no obligation to receive care at these facilities.  PASRR submitted to EDS on       PASRR number received on       Existing PASRR number confirmed on 09/11/17     FL2 transmitted to all facilities in geographic area requested by pt/family on 09/11/17     FL2 transmitted to all facilities within larger geographic area on       Patient informed that his/her managed care company has contracts with or will negotiate with certain facilities, including the following:        Yes   Patient/family informed of bed offers received.  Patient chooses bed at [facility]     Physician recommends and patient chooses bed at      Patient to be transferred to [facility] on 09/12/17.  Patient to be transferred to facility by PTAR     Patient family notified on 09/12/17 of transfer.  Name of family member notified:  Tom     PHYSICIAN       Additional Comment:     _______________________________________________ Tara Nolan, LCSWA 09/12/2017, 2:16 PM

## 2017-09-12 NOTE — Progress Notes (Signed)
Megan MansGretta S Scrivner to be D/C'd to SNF per MD order.  Discussed with the patient and all questions fully answered.  VSS, Skin clean, dry and intact without evidence of skin break down, no evidence of skin tears noted. IV catheter discontinued intact. Site without signs and symptoms of complications. Dressing and pressure applied.  An After Visit Summary was printed and given to the patient and PTAR.   D/c education completed with patient/family including follow up instructions, medication list, d/c activities limitations if indicated, with other d/c instructions as indicated by MD - patient able to verbalize understanding, all questions fully answered.   Patient instructed to return to ED, call 911, or call MD for any changes in condition.   Patient escorted via stretcher, and D/C to SNF via PTAR.  Joellyn HaffKayla L Price 09/12/2017 4:13 PM

## 2017-09-12 NOTE — Care Management Important Message (Signed)
Important Message  Patient Details  Name: Tara Nolan MRN: [id number] Date of Birth: Jul 21, 1919   Medicare Important Message Given:  Yes    Jeffrie Stander Abena 09/12/2017, 12:21 PM

## 2017-09-12 NOTE — Progress Notes (Addendum)
Patient will DC to: [facility]  Anticipated DC date: 09/12/17 Family notified: Son Media plannerTransport by: Sharin MonsPTAR (an hour behind)   Per MD patient ready for DC to [facility]. RN, patient, patient's family, and facility notified of DC. Discharge Summary sent to facility. RN given number for report [phone] Room 405A). DC packet on chart. Ambulance transport requested for patient.   CSW signing off.  Cristobal GoldmannNadia Li Fragoso, [place] Clinical Social Worker [phone]

## 2017-09-12 NOTE — Progress Notes (Signed)
PT Cancellation Note  Patient Details Name: Alroy DustGretta S Retz MRN: [id number] DOB: 1919/07/22   Cancelled Treatment:    Reason Eval/Treat Not Completed: Other (comment) EMS staff in room to transfer pt to SNF.   Gladys DammeBrittany Gates Jividen, PT, DPT  Acute Rehabilitation Services  Pager: [phone]    Lehman PromBrittany S Patrcia Schnepp 09/12/2017, 4:18 PM

## 2017-09-12 NOTE — Discharge Summary (Signed)
Physician Discharge Summary  Tara Nolan ZOX:[id number] DOB: 01-28-1919 DOA: 09/07/2017  PCP: Patient, No Pcp Per  Admit date: 09/07/2017 Discharge date: 09/12/2017  Admitted From: Home Disposition: SNF  Recommendations for Outpatient Follow-up:  1. Follow up with PCP in 1 week 2. Please obtain BMP/CBC in one week 3. Please follow up on the following pending results: None  Home Health: SNF Equipment/Devices: SNF  Discharge Condition: Stable CODE STATUS: DNR Diet recommendation: Heart healthy   Brief/Interim Summary:  Admission HPI written by Tara Braunondell A Smith, MD   Chief Complaint: passed out  HPI: Tara Nolan is a 83 y.o. female with medical history significant of CVA and thyroid disease; who presents after passing out at home. History is obtained from the patient her son at bedside. Patient reports that she was cleaning up things around her house history morning and fell. Initially she complained of pain in her left wrist, right shoulder, and knee. She was evaluated and found to have a acute left wrist fracture for which she was seen received a splint by orthopedics. Patient was discharge as initially able to walk, but but since being home reported worsening pain. Patient reports severe pain in her right knee for which it gave way and she went down on the floor.she had to call for her son to come pick it up. Subsequently patient was noted to have a short episodes of passing out. Patient would lose consciousness for only a few seconds. At one time patient slumped out of her chair.  She thinks symptoms may be related to her pain. Previously she reports syncopal episodes usually go related to thyroid medicine for which she self stopped the medication and reports symptoms resolved. Patient reports having diffuse pain but states only being able to take Tylenol as she has multiple side effects to any other medication or cream.  At one point patient complained of shortness of  breath and discomfort like something was sitting on her chest and family called EMS.   ED Course: Upon admission to the emergency department patient was noted to have a temperature of 99.28F,pulse 84-108, respirations 15-20, and all the vital signs maintained on room air. Labs were relatively unremarkable. Urinalysis is negative for any signs of infection. Chest x-ray showed no acute findings. X-rays of the right shoulder and right knee show no acute abnormalities. She denies having any chest pain or discomfort at this time.    Hospital course:  Syncope Unclear etiology. Patient continues to have syncopal episodes even when lying down. Does not appear to be heart related from review of telemetry strip. No evidence of hypoglycemia. Not consistent with seizure per history. ?narcolepsy/sleep disorder. No focal neurologic deficits to suggest stroke. Orthostatic vitals negative. Echo largely unremarkable. EEG normal. Cardiology consulted, however, low suspicion for heart etiology. MRI obtained without evidence for etiology. Recommend behavior modification to decrease fall risk.  Left wrist fracture Seen by orthopedic surgery as an outpatient. Currently has on a splint. Outpatient follow-up.  Chest discomfort Resolved. No evidence of ACS.  Hypothyroidism Patient has a history of partial thyroidectomy. TSH elevated secondary to voluntarily not taking her medication. Synthroid restarted. Patient will need repeat TSH in 4-6 weeks and adjust Synthroid as needed.  Hypertension Elevated. Patient states that even if we started blood pressure medication here in the hospital, she would not take it as an outpatient. Recommend continued discussion as an outpatient. Risks discussed.  Headache Likely secondary to elevated blood pressure.  History of stroke Discussed antiplatelet and  statin therapy. Patient was previously on aspirin and possibly Plavix, in addition to a statin, however, she has  discontinued all her medication.  Discharge Diagnoses:  Principal Problem:   Syncope Active Problems:   Fall   Right shoulder pain   Left wrist fracture   History of thyroid disease   Chest discomfort   History of CVA (cerebrovascular accident)    Discharge Instructions  Discharge Instructions    Increase activity slowly    Complete by:  As directed      Allergies as of 09/12/2017      Reactions   Other Other (See Comments)   All medications make me bonkers       Medication List    TAKE these medications   acetaminophen 500 MG tablet Commonly known as:  TYLENOL Take 125 mg by mouth every 6 (six) hours as needed. For pain.   feeding supplement (ENSURE ENLIVE) Liqd Take 237 mLs by mouth 2 (two) times daily between meals.   levothyroxine 25 MCG tablet Commonly known as:  SYNTHROID, LEVOTHROID Take 0.5 tablets (12.5 mcg total) by mouth daily before breakfast.       Contact information for follow-up providers    Sheral Apley, MD. Schedule an appointment as soon as possible for a visit in 1 week(s).   Specialty:  Orthopedic Surgery Contact information: [address] [place] [address] [phone]            Contact information for after-discharge care    Destination    [facility] Follow up.   Specialty:  Skilled Nursing Facility Contact information: [address] [place] [address] [phone]                 Allergies  Allergen Reactions  . Other Other (See Comments)    All medications make me bonkers     Consultations:  Cardiology   Procedures/Studies: Dg Chest 2 View  Result Date: 09/07/2017 CLINICAL DATA:  Larey Seat this morning. EXAM: CHEST  2 VIEW COMPARISON:  06/20/2012 FINDINGS: The lungs are clear except for mild chronic interstitial coarsening. No consolidation. No effusions. Hilar, mediastinal and cardiac contours are unremarkable and unchanged. IMPRESSION:  No acute findings Electronically Signed   By: Tara Nolan M.D.   On: 09/07/2017 21:51   Mr Brain Wo Contrast  Result Date: 09/12/2017 CLINICAL DATA:  83 year old female with unexplained altered mental status. EXAM: MRI HEAD WITHOUT CONTRAST TECHNIQUE: Multiplanar, multiecho pulse sequences of the brain and surrounding structures were obtained without intravenous contrast. COMPARISON:  Head CT without contrast 12/11/2003. FINDINGS: Brain: Cerebral volume is within normal limits for age. No restricted diffusion to suggest acute infarction. No midline shift, mass effect, evidence of mass lesion, ventriculomegaly, extra-axial collection or acute intracranial hemorrhage. Cervicomedullary junction within normal limits. There is a small round 5 mm suprasellar soft tissue nodule (series 5, image 12 and series 10, image 19) which is probably a small Rathke cleft cyst. No regional mass effect. In retrospect this is stable since 2005 (series 2, image 7 at that time versus series 6, image 12 today). Patchy in scattered bilateral cerebral white matter T2 and FLAIR hyperintensity, mild for age. No definite cortical encephalomalacia or chronic cerebral blood products. Mild for age T2 heterogeneity in the deep gray matter nuclei. The brainstem and cerebellum are normal for age. Vascular: Major intracranial vascular flow voids are preserved. Skull and upper cervical spine: Visible cervical spine is normal for age. Normal bone  marrow signal. Sinuses/Orbits: Normal orbits soft tissues. Paranasal sinuses are clear. Other: Visible internal auditory structures appear normal. Mastoid air cells are clear. Scalp and face soft tissues appear negative. IMPRESSION: 1.  No acute intracranial abnormality. 2. Mild for age signal changes in the cerebral white matter and deep gray matter, most commonly due to chronic small vessel disease. 3. 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 Electronically Signed    By: Odessa Fleming M.D.   On: 09/12/2017 13:34   Dg Knee Complete 4 Views Right  Result Date: 09/07/2017 CLINICAL DATA:  Persistent pain after falling this morning EXAM: RIGHT KNEE - COMPLETE 4+ VIEW COMPARISON:  None. FINDINGS: Negative for acute fracture or dislocation. No radiographic evidence of joint effusion. No acute soft tissue abnormality. IMPRESSION: Negative. Electronically Signed   By: Tara Nolan M.D.   On: 09/07/2017 21:52   Dg Humerus Right  Result Date: 09/07/2017 CLINICAL DATA:  Persistent right upper extremity pain after falling this morning EXAM: RIGHT HUMERUS - 2+ VIEW COMPARISON:  None. FINDINGS: There is no evidence of fracture or other focal bone lesions. Soft tissues are unremarkable. IMPRESSION: Negative. Electronically Signed   By: Tara Nolan M.D.   On: 09/07/2017 21:51     EEG (09/11/17)  Description: The patient is awake and drowsy during the recording.  During maximal wakefulness, there is a symmetric, medium voltage 8 Hz posterior dominant rhythm that attenuates with eye opening.  The record is symmetric.  During drowsiness, there is an increase in theta slowing of the background.  Deeper stages of sleep were not seen. Hyperventilation and photic stimulation were not performed.  There were no epileptiform discharges or electrographic seizures seen.    EKG lead was unremarkable.  Impression: This awake and drowsy EEG is normal.    Subjective: No episodes overnight. Vision and hearing improved.  Discharge Exam: Vitals:   09/11/17 2131 09/12/17 0452  BP: (!) 172/84 (!) 166/85  Pulse: 81 82  Resp: 18 18  Temp: 98.1 F (36.7 C) 98.2 F (36.8 C)  SpO2: 98% 97%   Vitals:   09/11/17 1303 09/11/17 1352 09/11/17 2131 09/12/17 0452  BP: (!) 156/79 130/67 (!) 172/84 (!) 166/85  Pulse: 87 99 81 82  Resp: 16  18 18   Temp: 99.3 F (37.4 C) 98 F (36.7 C) 98.1 F (36.7 C) 98.2 F (36.8 C)  TempSrc: Oral Oral Oral Oral  SpO2: 99% 96% 98% 97%   Weight:      Height:        General: Pt is alert, awake, not in acute distress Cardiovascular: RRR, S1/S2 +, no rubs, no gallops Respiratory: CTA bilaterally, no wheezing, no rhonchi Abdominal: Soft, NT, ND, bowel sounds + Extremities: no edema, no cyanosis Neuro: no focal deficits    The results of significant diagnostics from this hospitalization (including imaging, microbiology, ancillary and laboratory) are listed below for reference.     Microbiology: No results found for this or any previous visit (from the past 240 hour(s)).   Labs: BNP (last 3 results) No results for input(s): BNP in the last 8760 hours. Basic Metabolic Panel:  Recent Labs Lab 09/07/17 2020 09/10/17 0413 09/11/17 0649 09/12/17 0332  NA 135 139 138 135  K 3.5 3.4* 3.3* 4.1  CL 102 113* 111 107  CO2 21* 20* 21* 21*  GLUCOSE 113* 83 84 80  BUN 9 5* 5* 6  CREATININE 0.91 0.65 0.62 0.66  CALCIUM 8.9 7.5* 7.8* 8.2*  MG  --  1.8 1.8  --    Liver Function Tests:  Recent Labs Lab 09/10/17 0413  AST 24  ALT 11*  ALKPHOS 40  BILITOT 0.6  PROT 5.4*  ALBUMIN 3.0*   No results for input(s): LIPASE, AMYLASE in the last 168 hours. No results for input(s): AMMONIA in the last 168 hours. CBC:  Recent Labs Lab 09/07/17 2020 09/08/17 0337 09/10/17 0413 09/11/17 0649 09/12/17 0332  WBC 9.1 6.6 5.4 5.3 5.3  NEUTROABS  --   --  3.2  --   --   HGB 12.7 12.1 10.6* 11.6* 11.4*  HCT 38.5 36.1 31.2* 35.4* 33.7*  MCV 90.8 90.9 91.8 92.7 92.1  PLT 196 183 166 177 203   Cardiac Enzymes:  Recent Labs Lab 09/08/17 0342 09/08/17 1144 09/08/17 1659 09/08/17 2335  TROPONINI 0.04* 0.03* 0.03* <0.03   BNP: Invalid input(s): POCBNP CBG:  Recent Labs Lab 09/07/17 2040 09/11/17 0845  GLUCAP 115* 82   D-Dimer No results for input(s): DDIMER in the last 72 hours. Hgb A1c No results for input(s): HGBA1C in the last 72 hours. Lipid Profile No results for input(s): CHOL, HDL, LDLCALC, TRIG,  CHOLHDL, LDLDIRECT in the last 72 hours. Thyroid function studies No results for input(s): TSH, T4TOTAL, T3FREE, THYROIDAB in the last 72 hours.  Invalid input(s): FREET3 Anemia work up No results for input(s): VITAMINB12, FOLATE, FERRITIN, TIBC, IRON, RETICCTPCT in the last 72 hours. Urinalysis    Component Value Date/Time   COLORURINE STRAW (A) 09/07/2017 2020   APPEARANCEUR CLEAR 09/07/2017 2020   LABSPEC 1.005 09/07/2017 2020   PHURINE 8.0 09/07/2017 2020   GLUCOSEU NEGATIVE 09/07/2017 2020   HGBUR NEGATIVE 09/07/2017 2020   BILIRUBINUR NEGATIVE 09/07/2017 2020   KETONESUR 5 (A) 09/07/2017 2020   PROTEINUR 100 (A) 09/07/2017 2020   NITRITE NEGATIVE 09/07/2017 2020   LEUKOCYTESUR NEGATIVE 09/07/2017 2020    Time coordinating discharge: Over 30 minutes  SIGNED:   Jacquelin Hawking, MD [facility] 09/12/2017, 1:55 PM Pager [phone]  If 7PM-7AM, please contact night-coverage [email or web address] Password [email or web address]

## 2018-11-22 ENCOUNTER — Emergency Department (HOSPITAL_COMMUNITY): Payer: Medicare Other

## 2018-11-22 ENCOUNTER — Inpatient Hospital Stay (HOSPITAL_COMMUNITY)
Admission: EM | Admit: 2018-11-22 | Discharge: 2018-11-27 | DRG: 887 | Disposition: A | Discharge status: Skilled Nursing Facility | Payer: Medicare Other | Attending: Family Medicine | Admitting: Family Medicine

## 2018-11-22 ENCOUNTER — Encounter (HOSPITAL_COMMUNITY): Payer: Self-pay

## 2018-11-22 ENCOUNTER — Other Ambulatory Visit: Payer: Self-pay

## 2018-11-22 DIAGNOSIS — I10 Essential (primary) hypertension: Secondary | ICD-10-CM | POA: Diagnosis present

## 2018-11-22 DIAGNOSIS — E890 Postprocedural hypothyroidism: Secondary | ICD-10-CM | POA: Diagnosis not present

## 2018-11-22 DIAGNOSIS — G8929 Other chronic pain: Secondary | ICD-10-CM | POA: Diagnosis present

## 2018-11-22 DIAGNOSIS — R0789 Other chest pain: Secondary | ICD-10-CM | POA: Diagnosis not present

## 2018-11-22 DIAGNOSIS — M4856XA Collapsed vertebra, not elsewhere classified, lumbar region, initial encounter for fracture: Secondary | ICD-10-CM | POA: Diagnosis present

## 2018-11-22 DIAGNOSIS — Z8673 Personal history of transient ischemic attack (TIA), and cerebral infarction without residual deficits: Secondary | ICD-10-CM

## 2018-11-22 DIAGNOSIS — M48061 Spinal stenosis, lumbar region without neurogenic claudication: Secondary | ICD-10-CM | POA: Diagnosis present

## 2018-11-22 DIAGNOSIS — M545 Low back pain: Secondary | ICD-10-CM | POA: Diagnosis not present

## 2018-11-22 DIAGNOSIS — E039 Hypothyroidism, unspecified: Secondary | ICD-10-CM | POA: Diagnosis present

## 2018-11-22 DIAGNOSIS — M25559 Pain in unspecified hip: Secondary | ICD-10-CM | POA: Diagnosis present

## 2018-11-22 DIAGNOSIS — M549 Dorsalgia, unspecified: Secondary | ICD-10-CM | POA: Diagnosis not present

## 2018-11-22 DIAGNOSIS — G479 Sleep disorder, unspecified: Principal | ICD-10-CM | POA: Diagnosis present

## 2018-11-22 DIAGNOSIS — W19XXXA Unspecified fall, initial encounter: Secondary | ICD-10-CM | POA: Diagnosis not present

## 2018-11-22 DIAGNOSIS — R911 Solitary pulmonary nodule: Secondary | ICD-10-CM | POA: Diagnosis present

## 2018-11-22 DIAGNOSIS — D649 Anemia, unspecified: Secondary | ICD-10-CM | POA: Diagnosis present

## 2018-11-22 DIAGNOSIS — Z9119 Patient's noncompliance with other medical treatment and regimen: Secondary | ICD-10-CM

## 2018-11-22 DIAGNOSIS — T381X6A Underdosing of thyroid hormones and substitutes, initial encounter: Secondary | ICD-10-CM | POA: Diagnosis present

## 2018-11-22 DIAGNOSIS — I444 Left anterior fascicular block: Secondary | ICD-10-CM | POA: Diagnosis present

## 2018-11-22 DIAGNOSIS — B9620 Unspecified Escherichia coli [E. coli] as the cause of diseases classified elsewhere: Secondary | ICD-10-CM | POA: Diagnosis present

## 2018-11-22 DIAGNOSIS — R402252 Coma scale, best verbal response, oriented, at arrival to emergency department: Secondary | ICD-10-CM | POA: Diagnosis present

## 2018-11-22 DIAGNOSIS — Z888 Allergy status to other drugs, medicaments and biological substances status: Secondary | ICD-10-CM

## 2018-11-22 DIAGNOSIS — I272 Pulmonary hypertension, unspecified: Secondary | ICD-10-CM | POA: Diagnosis present

## 2018-11-22 DIAGNOSIS — E876 Hypokalemia: Secondary | ICD-10-CM | POA: Diagnosis present

## 2018-11-22 DIAGNOSIS — J189 Pneumonia, unspecified organism: Secondary | ICD-10-CM | POA: Diagnosis present

## 2018-11-22 DIAGNOSIS — R402362 Coma scale, best motor response, obeys commands, at arrival to emergency department: Secondary | ICD-10-CM | POA: Diagnosis present

## 2018-11-22 DIAGNOSIS — R296 Repeated falls: Secondary | ICD-10-CM | POA: Diagnosis present

## 2018-11-22 DIAGNOSIS — Y92001 Dining room of unspecified non-institutional (private) residence as the place of occurrence of the external cause: Secondary | ICD-10-CM

## 2018-11-22 DIAGNOSIS — I6523 Occlusion and stenosis of bilateral carotid arteries: Secondary | ICD-10-CM | POA: Diagnosis present

## 2018-11-22 DIAGNOSIS — R402142 Coma scale, eyes open, spontaneous, at arrival to emergency department: Secondary | ICD-10-CM | POA: Diagnosis present

## 2018-11-22 DIAGNOSIS — Z66 Do not resuscitate: Secondary | ICD-10-CM | POA: Diagnosis present

## 2018-11-22 DIAGNOSIS — R2681 Unsteadiness on feet: Secondary | ICD-10-CM | POA: Diagnosis present

## 2018-11-22 DIAGNOSIS — N390 Urinary tract infection, site not specified: Secondary | ICD-10-CM | POA: Diagnosis present

## 2018-11-22 DIAGNOSIS — R569 Unspecified convulsions: Secondary | ICD-10-CM | POA: Diagnosis present

## 2018-11-22 DIAGNOSIS — R55 Syncope and collapse: Secondary | ICD-10-CM | POA: Diagnosis present

## 2018-11-22 DIAGNOSIS — Z79899 Other long term (current) drug therapy: Secondary | ICD-10-CM

## 2018-11-22 DIAGNOSIS — D696 Thrombocytopenia, unspecified: Secondary | ICD-10-CM | POA: Diagnosis present

## 2018-11-22 LAB — COMPREHENSIVE METABOLIC PANEL
ALT: 19 U/L (ref 0–44)
AST: 35 U/L (ref 15–41)
Albumin: 4.3 g/dL (ref 3.5–5.0)
Alkaline Phosphatase: 44 U/L (ref 38–126)
Anion gap: 11 (ref 5–15)
BUN: 13 mg/dL (ref 8–23)
CHLORIDE: 107 mmol/L (ref 98–111)
CO2: 23 mmol/L (ref 22–32)
CREATININE: 0.87 mg/dL (ref 0.44–1.00)
Calcium: 8.4 mg/dL — ABNORMAL LOW (ref 8.9–10.3)
GFR calc Af Amer: >60 mL/min (ref >60)
GFR, EST NON AFRICAN AMERICAN: 56 mL/min — AB (ref >60)
GLUCOSE: 104 mg/dL — AB (ref 70–99)
Potassium: 3.1 mmol/L — ABNORMAL LOW (ref 3.5–5.1)
Sodium: 141 mmol/L (ref 135–145)
Total Bilirubin: 1.6 mg/dL — ABNORMAL HIGH (ref 0.3–1.2)
Total Protein: 7.2 g/dL (ref 6.5–8.1)

## 2018-11-22 LAB — TROPONIN I: Troponin I: 0.05 ng/mL (ref <0.03)

## 2018-11-22 LAB — CBC WITH DIFFERENTIAL/PLATELET
Abs Immature Granulocytes: 0.18 10*3/uL — ABNORMAL HIGH (ref 0.00–0.07)
BASOS PCT: 0 %
Basophils Absolute: 0 10*3/uL (ref 0.0–0.1)
Eosinophils Absolute: 0 10*3/uL (ref 0.0–0.5)
Eosinophils Relative: 0 %
HCT: 34.1 % — ABNORMAL LOW (ref 36.0–46.0)
Hemoglobin: 11 g/dL — ABNORMAL LOW (ref 12.0–15.0)
Immature Granulocytes: 1 %
Lymphocytes Relative: 3 %
Lymphs Abs: 0.5 10*3/uL — ABNORMAL LOW (ref 0.7–4.0)
MCH: 32 pg (ref 26.0–34.0)
MCHC: 32.3 g/dL (ref 30.0–36.0)
MCV: 99.1 fL (ref 80.0–100.0)
MONOS PCT: 5 %
Monocytes Absolute: 0.8 10*3/uL (ref 0.1–1.0)
Neutro Abs: 14.5 10*3/uL — ABNORMAL HIGH (ref 1.7–7.7)
Neutrophils Relative %: 91 %
PLATELETS: 147 10*3/uL — AB (ref 150–400)
RBC: 3.44 MIL/uL — ABNORMAL LOW (ref 3.87–5.11)
RDW: 15.7 % — AB (ref 11.5–15.5)
WBC: 16 10*3/uL — ABNORMAL HIGH (ref 4.0–10.5)
nRBC: 0 % (ref 0.0–0.2)

## 2018-11-22 LAB — URINALYSIS, ROUTINE W REFLEX MICROSCOPIC
Bilirubin Urine: NEGATIVE
Glucose, UA: NEGATIVE mg/dL
Ketones, ur: NEGATIVE mg/dL
Leukocytes, UA: NEGATIVE
Nitrite: NEGATIVE
Protein, ur: 30 mg/dL — AB
SPECIFIC GRAVITY, URINE: 1.006 (ref 1.005–1.030)
pH: 8 (ref 5.0–8.0)

## 2018-11-22 LAB — TSH: TSH: 23.031 u[IU]/mL — ABNORMAL HIGH (ref 0.350–4.500)

## 2018-11-22 LAB — CK: CK TOTAL: 229 U/L (ref 38–234)

## 2018-11-22 LAB — LIPASE, BLOOD: LIPASE: 31 U/L (ref 11–51)

## 2018-11-22 LAB — MAGNESIUM: Magnesium: 2 mg/dL (ref 1.7–2.4)

## 2018-11-22 MED ORDER — ALBUTEROL SULFATE (2.5 MG/3ML) 0.083% IN NEBU: 2.5000 mg | INHALATION_SOLUTION | RESPIRATORY_TRACT | Status: DC | PRN

## 2018-11-22 MED ORDER — POTASSIUM CHLORIDE IN NACL 40-0.9 MEQ/L-% IV SOLN
INTRAVENOUS | Status: DC
  Administered 2018-11-22: 50 mL/h via INTRAVENOUS
  Filled 2018-11-22: qty 1000

## 2018-11-22 MED ORDER — ACETAMINOPHEN 160 MG/5ML PO SOLN
500.0000 mg | Freq: Four times a day (QID) | ORAL | Status: DC | PRN
  Administered 2018-11-24 – 2018-11-25 (×2): 500 mg via ORAL
  Filled 2018-11-22 (×2): qty 20.3

## 2018-11-22 MED ORDER — LEVOTHYROXINE SODIUM 25 MCG PO TABS
12.5000 ug | ORAL_TABLET | Freq: Every day | ORAL | Status: DC
  Administered 2018-11-23: 12.5 ug via ORAL
  Filled 2018-11-22: qty 1

## 2018-11-22 MED ORDER — ACETAMINOPHEN 160 MG/5ML PO SOLN
500.0000 mg | Freq: Once | ORAL | Status: AC
  Administered 2018-11-22: 500 mg via ORAL
  Filled 2018-11-22: qty 20

## 2018-11-22 MED ORDER — SODIUM CHLORIDE (PF) 0.9 % IJ SOLN
INTRAMUSCULAR | Status: AC
  Filled 2018-11-22: qty 50

## 2018-11-22 MED ORDER — IOPAMIDOL (ISOVUE-370) INJECTION 76%
INTRAVENOUS | Status: AC
  Filled 2018-11-22: qty 100

## 2018-11-22 MED ORDER — SODIUM CHLORIDE 0.9% FLUSH
3.0000 mL | Freq: Two times a day (BID) | INTRAVENOUS | Status: DC
  Administered 2018-11-23 – 2018-11-27 (×7): 3 mL via INTRAVENOUS

## 2018-11-22 MED ORDER — POTASSIUM CHLORIDE CRYS ER 20 MEQ PO TBCR
40.0000 meq | EXTENDED_RELEASE_TABLET | Freq: Once | ORAL | Status: AC
  Administered 2018-11-22: 40 meq via ORAL
  Filled 2018-11-22: qty 2

## 2018-11-22 MED ORDER — SODIUM CHLORIDE 0.9 % IV BOLUS
1000.0000 mL | Freq: Once | INTRAVENOUS | Status: AC
  Administered 2018-11-22: 1000 mL via INTRAVENOUS

## 2018-11-22 MED ORDER — DOCUSATE SODIUM 100 MG PO CAPS: 100.0000 mg | ORAL_CAPSULE | Freq: Every day | ORAL | Status: DC | PRN

## 2018-11-22 MED ORDER — SODIUM CHLORIDE 0.9 % IV SOLN
1.0000 g | INTRAVENOUS | Status: AC
  Administered 2018-11-22 – 2018-11-24 (×3): 1 g via INTRAVENOUS
  Filled 2018-11-22: qty 1
  Filled 2018-11-22: qty 10
  Filled 2018-11-22: qty 1

## 2018-11-22 MED ORDER — IOPAMIDOL (ISOVUE-370) INJECTION 76%
100.0000 mL | Freq: Once | INTRAVENOUS | Status: AC | PRN
  Administered 2018-11-22: 100 mL via INTRAVENOUS

## 2018-11-22 NOTE — ED Triage Notes (Addendum)
Pt arrives via [facility] from home. Pt reports she had a fall last night about 2000. Pt reports the fall was mechanical in nature. Pt endorses possible head injury, denies LOC. Denies use blood thinners. Pt was unable to get up out of the floor until EMS arrived this am when son found the pt lying on the floor. Pts only compliant is lumbar pain

## 2018-11-22 NOTE — H&P (Signed)
History and Physical    [name]:[id number] DOB: [birth year] DOA: 11/22/2018  PCP: [name], MD   I have briefly reviewed patients previous medical reports in [facility].  Patient coming from: Home  Chief Complaint: Suspected that she passed out at home on night prior to admission, sustained a fall and complains of low back pain.  HPI: Tara Nolan is a very pleasant 84 year old female, lives alone, ambulates with the help of a walker, son and daughter-in-law who is a Publishing rights manager live close by to assist, PMH of hypothyroid who is noncompliant with Synthroid, CVA, prior hospitalization and October 2018 when she was extensively evaluated for syncope without etiology, was admitted because of a fall and left wrist fracture, presented to [facility] ED on 11/22/2018 following an episode of passing out at home and fall.  History provided by patient, her son and daughter-in-law at bedside.  Son called patient at around 6:30 PM on night prior to admission and patient was in her usual state of health and was getting ready to go to bed (does retire early).  She states that around 7:30 PM, she heard somebody at the door and got up with the help of a walker.  She reports some dizziness and lightheadedness which apparently are chronic for her and then apparently passed out.  When her daughter from [place] called her at 7 this morning, patient did not respond.  Son then went home and found patient sitting up in the living room approximately 20 feet away from her upturned walker.  There was no bleeding.  She was alert but slightly confused.  He activated EMS and patient was brought to the ED.  He reports that patient had brief, few seconds of transient unresponsiveness with EMS and EDP also reported to me that patient had a few seconds of unresponsiveness while laying in bed without seizure-like activity or incontinence.  She was unfortunately not on telemetry at that time.  She  reports mild acute on chronic low back pain.  She has new onset of cough with mild clear sputum, mild dyspnea but no fever or chills.  She complains of weak left-sided sharp intermittent nonradiating chest pain but cannot elaborate.  She has chronic hip pain and does not ambulate much.  ED Course: Lab work significant for hemoglobin 11, WBC 16, platelets 147, potassium 3.1, urine microscopy negative for UTI features, CK 229, chest x-ray shows focal opacity in the right lung apex possibly representing infiltrate versus pulmonary nodules CT head without acute findings CT neck without acute findings CT lumbar spine shows chronic L1 inferior endplate compression fracture and L4-5 moderate spinal stenosis CT angiogram of abdomen and pelvis shows no acute findings.  Review of Systems:  All other systems reviewed and apart from HPI, are negative.  As per family, patient is quite sharp, balances her own finances.  Past Medical History:  Diagnosis Date  . Stroke (HCC)   . Thyroid disease     Past Surgical History:  Procedure Laterality Date  . ABDOMINAL HYSTERECTOMY    . APPENDECTOMY    . HERNIA REPAIR    . THYROIDECTOMY      Social History  reports that she has never smoked. She has never used smokeless tobacco. She reports that she does not drink alcohol or use drugs.  Allergies  Allergen Reactions  . Other Other (See Comments)    All medications make me bonkers     History reviewed. No pertinent family history.  Prior to Admission medications   Medication Sig Start Date End Date Taking? Authorizing Provider  acetaminophen (TYLENOL) 500 MG tablet Take 125 mg by mouth every 6 (six) hours as needed. For pain.   Yes [provider]  docusate sodium (COLACE) 100 MG capsule Take 100 mg by mouth daily as needed for mild constipation.   Yes [provider]  levothyroxine (SYNTHROID, LEVOTHROID) 25 MCG tablet Take 0.5 tablets (12.5 mcg total) by mouth daily before breakfast.  09/13/17  Yes Narda BondsNettey, Ralph A, MD    Physical Exam: Vitals:   11/22/18 1245 11/22/18 1330 11/22/18 1400 11/22/18 1430  BP:  (!) 116/91 (!) 141/78 (!) 142/67  Pulse: 86 83 88 82  Resp: (!) 21 18 14 17   Temp:      TempSrc:      SpO2: 95% 95% 95% 92%  Weight:          Constitutional: Pleasant elderly female, small built and frail, lying comfortably propped up in bed without distress. Eyes: PERTLA, lids and conjunctivae normal.  Bilateral immature cataracts. ENMT: Mucous membranes are slightly dry. Posterior pharynx clear of any exudate or lesions.  Missing most of her teeth. Neck: supple, no masses, no thyromegaly Respiratory: clear to auscultation bilaterally, no wheezing, no crackles. Normal respiratory effort. No accessory muscle use.  Cardiovascular: S1 & S2 heard, regular rate and rhythm, no murmurs / rubs / gallops. No extremity edema. 2+ pedal pulses. No carotid bruits.  Telemetry personally reviewed: Sinus rhythm, BBB morphology, low voltage. Abdomen: No distension, no tenderness, no masses palpated. No hepatosplenomegaly. Bowel sounds normal.  Musculoskeletal: no clubbing / cyanosis. No joint deformity upper and lower extremities. Good ROM, no contractures. Normal muscle tone.  Skin: no rashes, lesions, ulcers. No induration Neurologic: CN 2-12 grossly intact. Sensation intact, DTR normal. Strength 5/5 in all 4 limbs.  Psychiatric: Slightly impaired judgment and insight. Alert and oriented x 3. Normal mood.     Labs on Admission: I have personally reviewed following labs and imaging studies  CBC: Recent Labs  Lab 11/22/18 0925  WBC 16.0*  NEUTROABS 14.5*  HGB 11.0*  HCT 34.1*  MCV 99.1  PLT 147*   Basic Metabolic Panel: Recent Labs  Lab 11/22/18 1036  NA 141  K 3.1*  CL 107  CO2 23  GLUCOSE 104*  BUN 13  CREATININE 0.87  CALCIUM 8.4*   Liver Function Tests: Recent Labs  Lab 11/22/18 1036  AST 35  ALT 19  ALKPHOS 44  BILITOT 1.6*  PROT 7.2    ALBUMIN 4.3   Coagulation Profile: No results for input(s): INR, PROTIME in the last 168 hours. Cardiac Enzymes: Recent Labs  Lab 11/22/18 1036  CKTOTAL 229   HbA1C: No results for input(s): HGBA1C in the last 72 hours. CBG: No results for input(s): GLUCAP in the last 168 hours. Urine analysis:    Component Value Date/Time   COLORURINE STRAW (A) 11/22/2018 0925   APPEARANCEUR CLEAR 11/22/2018 0925   LABSPEC 1.006 11/22/2018 0925   PHURINE 8.0 11/22/2018 0925   GLUCOSEU NEGATIVE 11/22/2018 0925   HGBUR SMALL (A) 11/22/2018 0925   BILIRUBINUR NEGATIVE 11/22/2018 0925   KETONESUR NEGATIVE 11/22/2018 0925   PROTEINUR 30 (A) 11/22/2018 0925   NITRITE NEGATIVE 11/22/2018 0925   LEUKOCYTESUR NEGATIVE 11/22/2018 0925     Radiological Exams on Admission: Dg Chest 2 View  Result Date: 11/22/2018 CLINICAL DATA:  Pt arrives via [facility] from home. Pt reports she had a fall last night about 2000.  Pt was unable to get up out of the floor until EMS arrived this morning when son found the pt lying on the floor. Pt complained of lower back and right hip pain. EXAM: CHEST - 2 VIEW COMPARISON:  09/07/2017 FINDINGS: The heart is mildly enlarged and stable in configuration. The aorta is tortuous and partially calcified. There is prominence of interstitial markings, likely chronic. Focal asymmetric opacities are identified in the RIGHT lung apex, raising question of infiltrate or underlying nodule. Recommend follow-up chest x-ray and possible CT if there is failure of these areas to resolve. Scoliosis, convex LEFT. There are numerous thoracic and UPPER lumbar which compression fractures. The age of these fractures is indeterminate. There is no pneumothorax. No acute displaced rib fractures. IMPRESSION: 1. Cardiomegaly. 2. Stable prominence of interstitial markings. 3. Focal opacity in the RIGHT lung apex possibly representing infiltrate versus pulmonary nodules. Follow-up is recommended. Recommend  follow-up PA and LATERAL chest to determine if these are persistent. Consider CT of the chest if findings persist. Electronically Signed   By: Norva Pavlov M.D.   On: 11/22/2018 11:03   Ct Head Wo Contrast  Result Date: 11/22/2018 CLINICAL DATA:  Patient fell last evening at 2000 hours. No loss of consciousness. Headache. EXAM: CT HEAD WITHOUT CONTRAST CT CERVICAL SPINE WITHOUT CONTRAST TECHNIQUE: Multidetector CT imaging of the head and cervical spine was performed following the standard protocol without intravenous contrast. Multiplanar CT image reconstructions of the cervical spine were also generated. COMPARISON:  09/12/2017 MRI, head CT 12/11/2003 FINDINGS: CT HEAD FINDINGS Brain: Age related involutional changes the brain with sulcal prominence consistent with superficial atrophy. No hydrocephalus. Mild-to-moderate small vessel ischemic disease of periventricular white matter appears chronic. No large vascular territory infarct, hemorrhage, midline shift or edema. Midline fourth ventricle and basal cisterns without effacement. Brainstem and cerebellum appear intact. No intra-axial mass nor extra-axial fluid collections. Bilateral basal ganglial idiopathic calcifications are noted. Vascular: No hyperdense vessel sign or unexpected calcifications. Moderate atherosclerosis of the carotid siphons. Skull: Intact Sinuses/Orbits: Mild membrane thickening of the ethmoid sinus. The remainder of the paranasal sinuses are unremarkable. No acute ocular abnormality. Other: Clear mastoids. CT CERVICAL SPINE FINDINGS Alignment: Mild grade 1 anterolisthesis of C3 on C4 and C7 on T1 likely degenerative in etiology. Intact atlantodental interval and craniocervical relationship. Skull base and vertebrae: Intact skull base. No acute cervical spine fracture or jumped facets.No suspicious osseous lesions. Soft tissues and spinal canal: No prevertebral fluid or swelling. No visible canal hematoma. Disc levels: Moderate disc  flattening at C2-3 with marked disc flattening from C4 through C7. Small posterior marginal osteophytes are noted from C4 through C7. Bilateral mild facet arthropathy is noted. Mild right-sided C3-4 and bilateral C4-5, C5-6 and C6-7 uncovertebral joint osteoarthritis with uncinate spurring. No significant foraminal encroachment. No significant central canal stenosis. Upper chest: Apical pleuroparenchymal scarring. Subpleural 6 mm nodular opacity at the right lung apex may be postinfectious or inflammatory related scarring. Nodule not entirely excluded. Aortic atherosclerosis at the arch. Other: Extracranial carotid arteriosclerosis. IMPRESSION: 1. Atrophy with chronic appearing small vessel ischemic disease. No acute intracranial abnormality. 2. Cervical spondylosis without acute posttraumatic cervical spine fracture or subluxation. 3. Subpleural 6 mm nodular opacity at the right lung apex may be postinfectious or inflammatory in etiology. Pulmonary nodule not entirely excluded. Non-contrast chest CT at 6-12 months is recommended. If the nodule is stable at time of repeat CT, then future CT at 18-24 months (from today's scan) is considered optional for low-risk patients, but  is recommended for high-risk patients. This recommendation follows the consensus statement: Guidelines for Management of Incidental Pulmonary Nodules Detected on CT Images: From the Fleischner Society 2017; Radiology 2017; 284:228-243. Electronically Signed   By: Tollie Eth M.D.   On: 11/22/2018 14:07   Ct Cervical Spine Wo Contrast  Result Date: 11/22/2018 CLINICAL DATA:  Patient fell last evening at 2000 hours. No loss of consciousness. Headache. EXAM: CT HEAD WITHOUT CONTRAST CT CERVICAL SPINE WITHOUT CONTRAST TECHNIQUE: Multidetector CT imaging of the head and cervical spine was performed following the standard protocol without intravenous contrast. Multiplanar CT image reconstructions of the cervical spine were also generated.  COMPARISON:  09/12/2017 MRI, head CT 12/11/2003 FINDINGS: CT HEAD FINDINGS Brain: Age related involutional changes the brain with sulcal prominence consistent with superficial atrophy. No hydrocephalus. Mild-to-moderate small vessel ischemic disease of periventricular white matter appears chronic. No large vascular territory infarct, hemorrhage, midline shift or edema. Midline fourth ventricle and basal cisterns without effacement. Brainstem and cerebellum appear intact. No intra-axial mass nor extra-axial fluid collections. Bilateral basal ganglial idiopathic calcifications are noted. Vascular: No hyperdense vessel sign or unexpected calcifications. Moderate atherosclerosis of the carotid siphons. Skull: Intact Sinuses/Orbits: Mild membrane thickening of the ethmoid sinus. The remainder of the paranasal sinuses are unremarkable. No acute ocular abnormality. Other: Clear mastoids. CT CERVICAL SPINE FINDINGS Alignment: Mild grade 1 anterolisthesis of C3 on C4 and C7 on T1 likely degenerative in etiology. Intact atlantodental interval and craniocervical relationship. Skull base and vertebrae: Intact skull base. No acute cervical spine fracture or jumped facets.No suspicious osseous lesions. Soft tissues and spinal canal: No prevertebral fluid or swelling. No visible canal hematoma. Disc levels: Moderate disc flattening at C2-3 with marked disc flattening from C4 through C7. Small posterior marginal osteophytes are noted from C4 through C7. Bilateral mild facet arthropathy is noted. Mild right-sided C3-4 and bilateral C4-5, C5-6 and C6-7 uncovertebral joint osteoarthritis with uncinate spurring. No significant foraminal encroachment. No significant central canal stenosis. Upper chest: Apical pleuroparenchymal scarring. Subpleural 6 mm nodular opacity at the right lung apex may be postinfectious or inflammatory related scarring. Nodule not entirely excluded. Aortic atherosclerosis at the arch. Other: Extracranial  carotid arteriosclerosis. IMPRESSION: 1. Atrophy with chronic appearing small vessel ischemic disease. No acute intracranial abnormality. 2. Cervical spondylosis without acute posttraumatic cervical spine fracture or subluxation. 3. Subpleural 6 mm nodular opacity at the right lung apex may be postinfectious or inflammatory in etiology. Pulmonary nodule not entirely excluded. Non-contrast chest CT at 6-12 months is recommended. If the nodule is stable at time of repeat CT, then future CT at 18-24 months (from today's scan) is considered optional for low-risk patients, but is recommended for high-risk patients. This recommendation follows the consensus statement: Guidelines for Management of Incidental Pulmonary Nodules Detected on CT Images: From the Fleischner Society 2017; Radiology 2017; 284:228-243. Electronically Signed   By: Tollie Eth M.D.   On: 11/22/2018 14:07   Ct L-spine No Charge  Result Date: 11/22/2018 CLINICAL DATA:  Low back pain after a fall last night. Initial encounter. EXAM: CT LUMBAR SPINE WITHOUT CONTRAST TECHNIQUE: Multidetector CT imaging of the lumbar spine was performed without intravenous contrast administration. Multiplanar CT image reconstructions were also generated. COMPARISON:  None. FINDINGS: Segmentation: 5 lumbar type vertebrae. Alignment: Thoracolumbar levoscoliosis. Minimal retrolisthesis of T11 on T12 and L1 on L2. 6 mm anterolisthesis of L4 on L5 and 9 mm anterolisthesis of L5 on S1 with a unilateral right-sided L5 pars defect. Vertebrae: L1 inferior endplate compression  fracture with superimposed central Schmorl's node resulting in severe vertebral body height loss centrally, favored to be chronic. No suspicious osseous lesion. Diffuse osteopenia. Paraspinal and other soft tissues: Bilateral posterior paraspinal muscle fatty atrophy. Intra-abdominal contents reported separately. Disc levels: Advanced disc space narrowing at T11-12 and from L2-3 to L5-S1 with degenerative  endplate sclerosis, spurring, and vacuum disc at most levels. L1-2: Disc bulging, spurring, and mild L1 inferior endplate retropulsion result in moderate right neural foraminal stenosis without spinal stenosis. L2-3: Disc bulging, endplate spurring, and mild facet and ligamentum flavum hypertrophy result in mild spinal stenosis and mild bilateral neural foraminal stenosis. L3-4: Disc bulging, endplate spurring, and mild facet and ligamentum flavum hypertrophy result in mild spinal stenosis and mild right and moderate left neural foraminal stenosis. L4-5: Anterolisthesis with bulging uncovered disc and facet hypertrophy result in moderate spinal stenosis and moderate right and severe left neural foraminal stenosis. L5-S1: Anterolisthesis with bulging uncovered disc results in moderate right greater than left neural foraminal stenosis and mild spinal stenosis. IMPRESSION: 1. L1 inferior endplate compression fracture in Schmorl's node deformity, favored to be chronic. 2. Grade 1 anterolisthesis at L4-5 with moderate spinal and moderate to severe neural foraminal stenosis. 3. 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 Electronically Signed   By: Sebastian AcheAllen  Grady M.D.   On: 11/22/2018 14:08   Dg Hip Unilat W Or Wo Pelvis 2-3 Views Right  Result Date: 11/22/2018 CLINICAL DATA:  Pt arrives via [facility] from home. Pt reports she had a fall last night about 2000. Pt was unable to get up out of the floor until EMS arrived this morning when son found the pt lying on the floor. Pt complained of lower back and right hip pain. EXAM: DG HIP (WITH OR WITHOUT PELVIS) 2-3V RIGHT COMPARISON:  06/20/2012 FINDINGS: Bones appear radiolucent. There is no acute fracture or subluxation. Mild degenerative changes are seen in the LOWER lumbar spine. There is atherosclerotic calcification of the femoral arteries. IMPRESSION: No evidence for acute abnormality.  Electronically Signed   By: Norva PavlovElizabeth  Brown M.D.   On: 11/22/2018 11:07   Ct Angio Abd/pel W And/or Wo Contrast  Result Date: 11/22/2018 CLINICAL DATA:  fall last night about 2000. Pt reports the fall was mechanical in nature. Pt endorses possible head injury, denies LOC. Denies use blood thinners. Pt was unable to get up out of the floor until EMS arrived this am when son found the pt lying on the floor. Pts only compliant is lumbar pain. Abd mass, pulsatile, AAA suspected. EXAM: CTA ABDOMEN AND PELVIS WITH CONTRAST TECHNIQUE: Multidetector CT imaging of the abdomen and pelvis was performed using the standard protocol during bolus administration of intravenous contrast. Multiplanar reconstructed images and MIPs were obtained and reviewed to evaluate the vascular anatomy. CONTRAST:  100mL ISOVUE-370 IOPAMIDOL (ISOVUE-370) INJECTION 76% COMPARISON:  06/02/2004 FINDINGS: VASCULAR Aorta: Moderate calcified atheromatous plaque. Small penetrating atheromatous ulcer posteriorly in the infrarenal segment without intramural hematoma. No aneurysm, dissection, or stenosis. Celiac: Patent without evidence of aneurysm, dissection, vasculitis or significant stenosis. SMA: Calcified ostial plaque without significant stenosis, patent distally with classic distal branch anatomy. Renals: Single left, widely patent. Single right, with partially calcified ostial plaque resulting in short segment stenosis of at least mild severity, patent distally. IMA: Patent without evidence of aneurysm, dissection, vasculitis or significant stenosis. Inflow: Moderate tortuosity. Scattered calcified atheromatous plaque. No aneurysm, dissection, or stenosis. Proximal Outflow: Bilateral common femoral and  visualized portions of the superficial and profunda femoral arteries are patent without evidence of aneurysm, dissection, vasculitis or significant stenosis. Veins: No obvious venous abnormality within the limitations of this arterial phase  study. Review of the MIP images confirms the above findings. NON-VASCULAR Lower chest: Pectus deformity. No pleural or pericardial effusion. Minimal dependent atelectasis in the visualized lung bases. Hepatobiliary: No focal liver abnormality is seen. No gallstones, gallbladder wall thickening, or biliary dilatation. Pancreas: Unremarkable. No pancreatic ductal dilatation or surrounding inflammatory changes. Spleen: Normal in size without focal abnormality. Adrenals/Urinary Tract: Normal adrenals. 1.7 cm upper pole left renal cyst. 2.8 cm right lower pole renal cyst. No hydronephrosis. Urinary bladder physiologically distended. Stomach/Bowel: Stomach and small bowel are nondilated. Appendix surgically absent. Colon is nondilated. Scattered sigmoid diverticula without adjacent inflammatory/edematous change or abscess. Lymphatic: No abdominal or pelvic adenopathy localized. Reproductive: Status post hysterectomy. No adnexal masses. Other: No ascites. No free air. Musculoskeletal: L1 compression deformity with 50% loss of height anteriorly , age indeterminate. Mild thoracolumbar scoliosis. Multilevel spondylitic changes. Grade 1 anterolisthesis L4-5 and L5-S1 probably related to bilateral facet DJD. IMPRESSION: VASCULAR 1. No acute findings. 2. Aortoiliac Atherosclerosis (ICD10-170.0) without aneurysm. 3. Right renal artery ostial stenosis of possible hemodynamic significance. NON-VASCULAR 1. L1 compression deformity, age indeterminate. 2. Sigmoid diverticulosis. Electronically Signed   By: Corlis Leak M.D.   On: 11/22/2018 14:04    EKG: Independently reviewed.  Sinus rhythm, LAFB, LVH no acute findings.  Assessment/Plan Principal Problem:   Syncope and collapse Active Problems:   Fall   Chest discomfort   History of CVA (cerebrovascular accident)   Back pain   Hypothyroid     1. Recurrent syncope: Unclear etiology.  This has happened in the past in October 2018 when she was extensively evaluated while  hospitalized.  Since then she apparently did not have it until recently.  She reported to her son that she has been having it almost daily lately.  This can happen both in upright position and while laying in bed.  No associated prodrome or seizure-like activity.  As per extensive discussion with patient and her family, they wish to pursue evaluation again including telemetry monitoring, 2D echo, carotid Dopplers, EEG and if needed outpatient heart monitor.  This is more so they can know the cause if possible and treatment if not very aggressive/invasive.  Admit to telemetry for observation.  Requested troponin cycling, TTE, carotid Dopplers, EEG and orthostatic blood pressures.  PT and OT evaluation. 2. Fall: Likely related to syncope and unsteady gait.  PT and OT evaluation. 3. Atypical chest discomfort: Does not appear ischemic in nature.  Cycle troponin. 4. Hypothyroid: Patient reports noncompliance to Synthroid because she feels that what causes her to pass out.  Has not taken Synthroid in 2 weeks.  Last TSH in 2018 was 40.  Repeat TSH.  Willing to take Synthroid, ordered. 5. New cough/possible right upper lobe pneumonia: Could be aspiration when she syncopized.  IV ceftriaxone followed by Augmentin at discharge.  No history of dysphagia or aspiration risk otherwise. 6. Back pain/L1 compression fracture, possibly chronic: PRN Tylenol and K pad.  PT and OT evaluation.  Patient does not want to try any stronger medications. 7. Hypokalemia: Replace and follow.  Check magnesium. 8. Anemia: No bleeding reported.  Follow CBCs. 9. Leukocytosis: May be stress response or due to possible pneumonia.  Follow CBC in a.m. 10. Suspected right upper lobe pulmonary nodule: Noted.  Given advanced age, no further work-up needed.  DVT prophylaxis: SCDs Code Status: DNR-confirmed in the presence of her son and daughter-in-law who is a Publishing rights manager. Family Communication: Discussed in detail with patient's son  and daughter-in-law at bedside, updated care and answered questions. Disposition Plan: DC home pending clinical improvement, hopefully tomorrow. Consults called: None Admission status: Observation, telemetry.  Severity of Illness: The appropriate patient status for this patient is OBSERVATION. Observation status is judged to be reasonable and necessary in order to provide the required intensity of service to ensure the patient's safety. The patient's presenting symptoms, physical exam findings, and initial radiographic and laboratory data in the context of their medical condition is felt to place them at decreased risk for further clinical deterioration. Furthermore, it is anticipated that the patient will be medically stable for discharge from the hospital within 2 midnights of admission. The following factors support the patient status of observation.   " The patient's presenting symptoms include passing out and fall at home.  Also episode of transient passing out in the ED. " The physical exam findings include afebrile, stable vital signs. " The initial radiographic and laboratory data are anemia, thrombocytopenia, hypokalemia, chest x-ray suspicious for pneumonia.Marcellus Scott MD [facility] Pager [phone]  If 7PM-7AM, please contact night-coverage [email or web address] Password [facility]  11/22/2018, 5:24 PM

## 2018-11-22 NOTE — ED Provider Notes (Signed)
[facility]-EMERGENCY DEPT Provider Note   CSN: [id number] Arrival date & time: 11/22/18  0810     History   Chief Complaint Chief Complaint  Patient presents with  . Fall    HPI Tara Nolan is a 84 y.o. female.  HPI  Had a fall last night around 8PM Does not remember if she had syncope or mechanical fall, believes it was syncope because she has nor recollection of event Was on the floor in the dining room with walker on top of her, woke up to phone ringing this AM Crawled on floor but was not able to stand up Back pain , had been having it prior to this. Lower back. Worse today.  Generalized weakness. No focal numbness or weakness. Coughing spells, productive of phlegm, started yesterday after lunch Throwing up for the last few days in the afternoon Yesterday had epigastric pain, bloating, seemed to be worse after lunch Reports no change in urinary symptoms Took stool softener last night and had diarrhea today, will alternate No headache but reports constant eye aches from allergies    Past Medical History:  Diagnosis Date  . Stroke (HCC)   . Thyroid disease     Patient Active Problem List   Diagnosis Date Noted  . Syncope and collapse 11/22/2018  . Back pain 11/22/2018  . Hypothyroid 11/22/2018  . History of CVA (cerebrovascular accident) 09/12/2017  . Fall 09/08/2017  . Right shoulder pain 09/08/2017  . Left wrist fracture 09/08/2017  . History of thyroid disease 09/08/2017  . Chest discomfort 09/08/2017  . Hip pain, acute 06/20/2012  . Knee pain 06/20/2012  . Hamstring tendon rupture 06/20/2012    Past Surgical History:  Procedure Laterality Date  . ABDOMINAL HYSTERECTOMY    . APPENDECTOMY    . HERNIA REPAIR    . THYROIDECTOMY       OB History   No obstetric history on file.      Home Medications    Prior to Admission medications   Medication Sig Start Date End Date Taking? Authorizing Provider  acetaminophen  (TYLENOL) 500 MG tablet Take 125 mg by mouth every 6 (six) hours as needed. For pain.   Yes [provider]  docusate sodium (COLACE) 100 MG capsule Take 100 mg by mouth daily as needed for mild constipation.   Yes [provider]  levothyroxine (SYNTHROID, LEVOTHROID) 25 MCG tablet Take 0.5 tablets (12.5 mcg total) by mouth daily before breakfast. 09/13/17  Yes Narda BondsNettey, Ralph A, MD    Family History History reviewed. No pertinent family history.  Social History Social History   Tobacco Use  . Smoking status: Never Smoker  . Smokeless tobacco: Never Used  Substance Use Topics  . Alcohol use: No  . Drug use: No     Allergies   Other   Review of Systems Review of Systems  Constitutional: Negative for fever.  HENT: Negative for sore throat.   Eyes: Negative for visual disturbance.  Respiratory: Positive for cough. Negative for shortness of breath.   Cardiovascular: Negative for chest pain.  Gastrointestinal: Positive for abdominal pain, nausea and vomiting.  Genitourinary: Negative for difficulty urinating.  Musculoskeletal: Negative for back pain and neck pain.  Skin: Negative for rash.  Neurological: Positive for syncope. Negative for weakness, numbness and headaches.     Physical Exam Updated Vital Signs BP (!) 113/59 (BP Location: Left Arm)   Pulse 84   Temp 98.8 F (37.1 C) (Oral)   Resp  16   Wt 43.5 kg   SpO2 98%   BMI 18.75 kg/m   Physical Exam Vitals signs and nursing note reviewed.  Constitutional:      General: She is not in acute distress.    Appearance: She is well-developed. She is not diaphoretic.  HENT:     Head: Normocephalic and atraumatic.  Eyes:     Conjunctiva/sclera: Conjunctivae normal.  Neck:     Musculoskeletal: Normal range of motion.  Cardiovascular:     Rate and Rhythm: Normal rate and regular rhythm.     Heart sounds: Normal heart sounds. No murmur. No friction rub. No gallop.   Pulmonary:     Effort:  Pulmonary effort is normal. No respiratory distress.     Breath sounds: Normal breath sounds. No wheezing or rales.  Abdominal:     General: There is no distension.     Palpations: Abdomen is soft.     Tenderness: There is abdominal tenderness. There is no guarding.  Musculoskeletal:     Right hip: She exhibits decreased range of motion and bony tenderness.     Cervical back: She exhibits bony tenderness.     Thoracic back: She exhibits no bony tenderness.     Lumbar back: She exhibits tenderness (low, sacrum).  Skin:    General: Skin is warm and dry.     Findings: No erythema or rash.  Neurological:     Mental Status: She is alert and oriented to person, place, and time.     GCS: GCS eye subscore is 4. GCS verbal subscore is 5. GCS motor subscore is 6.     Comments: 5/5 strength but pain limiting movement of right hip      ED Treatments / Results  Labs (all labs ordered are listed, but only abnormal results are displayed) Labs Reviewed  CBC WITH DIFFERENTIAL/PLATELET - Abnormal; Notable for the following components:      Result Value   WBC 16.0 (*)    RBC 3.44 (*)    Hemoglobin 11.0 (*)    HCT 34.1 (*)    RDW 15.7 (*)    Platelets 147 (*)    Neutro Abs 14.5 (*)    Lymphs Abs 0.5 (*)    Abs Immature Granulocytes 0.18 (*)    All other components within normal limits  URINALYSIS, ROUTINE W REFLEX MICROSCOPIC - Abnormal; Notable for the following components:   Color, Urine STRAW (*)    Hgb urine dipstick SMALL (*)    Protein, ur 30 (*)    Bacteria, UA RARE (*)    All other components within normal limits  COMPREHENSIVE METABOLIC PANEL - Abnormal; Notable for the following components:   Potassium 3.1 (*)    Glucose, Bld 104 (*)    Calcium 8.4 (*)    Total Bilirubin 1.6 (*)    GFR calc non Af Amer 56 (*)    All other components within normal limits  TSH - Abnormal; Notable for the following components:   TSH 23.031 (*)    All other components within normal limits    TROPONIN I - Abnormal; Notable for the following components:   Troponin I 0.05 (*)    All other components within normal limits  URINE CULTURE  CK  LIPASE, BLOOD  MAGNESIUM  TROPONIN I  TROPONIN I  BASIC METABOLIC PANEL  CBC    EKG EKG Interpretation  Date/Time:  Saturday November 22 2018 12:15:30 EST Ventricular Rate:  84 PR Interval:  QRS Duration: 114 QT Interval:  398 QTC Calculation: 471 R Axis:   -59 Text Interpretation:  Sinus rhythm Left anterior fascicular block Left ventricular hypertrophy Anterior infarct, old Artifact in lead(s) I III aVL V2 V3 and baseline wander in lead(s) V2 No significant change since last tracing Confirmed by Alvira Monday ([id number]) on 11/22/2018 2:24:21 PM   Radiology Dg Chest 2 View  Result Date: 11/22/2018 CLINICAL DATA:  Pt arrives via [facility] from home. Pt reports she had a fall last night about 2000. Pt was unable to get up out of the floor until EMS arrived this morning when son found the pt lying on the floor. Pt complained of lower back and right hip pain. EXAM: CHEST - 2 VIEW COMPARISON:  09/07/2017 FINDINGS: The heart is mildly enlarged and stable in configuration. The aorta is tortuous and partially calcified. There is prominence of interstitial markings, likely chronic. Focal asymmetric opacities are identified in the RIGHT lung apex, raising question of infiltrate or underlying nodule. Recommend follow-up chest x-ray and possible CT if there is failure of these areas to resolve. Scoliosis, convex LEFT. There are numerous thoracic and UPPER lumbar which compression fractures. The age of these fractures is indeterminate. There is no pneumothorax. No acute displaced rib fractures. IMPRESSION: 1. Cardiomegaly. 2. Stable prominence of interstitial markings. 3. Focal opacity in the RIGHT lung apex possibly representing infiltrate versus pulmonary nodules. Follow-up is recommended. Recommend follow-up PA and LATERAL chest to determine if these  are persistent. Consider CT of the chest if findings persist. Electronically Signed   By: Norva Pavlov M.D.   On: 11/22/2018 11:03   Ct Head Wo Contrast  Result Date: 11/22/2018 CLINICAL DATA:  Patient fell last evening at 2000 hours. No loss of consciousness. Headache. EXAM: CT HEAD WITHOUT CONTRAST CT CERVICAL SPINE WITHOUT CONTRAST TECHNIQUE: Multidetector CT imaging of the head and cervical spine was performed following the standard protocol without intravenous contrast. Multiplanar CT image reconstructions of the cervical spine were also generated. COMPARISON:  09/12/2017 MRI, head CT 12/11/2003 FINDINGS: CT HEAD FINDINGS Brain: Age related involutional changes the brain with sulcal prominence consistent with superficial atrophy. No hydrocephalus. Mild-to-moderate small vessel ischemic disease of periventricular white matter appears chronic. No large vascular territory infarct, hemorrhage, midline shift or edema. Midline fourth ventricle and basal cisterns without effacement. Brainstem and cerebellum appear intact. No intra-axial mass nor extra-axial fluid collections. Bilateral basal ganglial idiopathic calcifications are noted. Vascular: No hyperdense vessel sign or unexpected calcifications. Moderate atherosclerosis of the carotid siphons. Skull: Intact Sinuses/Orbits: Mild membrane thickening of the ethmoid sinus. The remainder of the paranasal sinuses are unremarkable. No acute ocular abnormality. Other: Clear mastoids. CT CERVICAL SPINE FINDINGS Alignment: Mild grade 1 anterolisthesis of C3 on C4 and C7 on T1 likely degenerative in etiology. Intact atlantodental interval and craniocervical relationship. Skull base and vertebrae: Intact skull base. No acute cervical spine fracture or jumped facets.No suspicious osseous lesions. Soft tissues and spinal canal: No prevertebral fluid or swelling. No visible canal hematoma. Disc levels: Moderate disc flattening at C2-3 with marked disc flattening from  C4 through C7. Small posterior marginal osteophytes are noted from C4 through C7. Bilateral mild facet arthropathy is noted. Mild right-sided C3-4 and bilateral C4-5, C5-6 and C6-7 uncovertebral joint osteoarthritis with uncinate spurring. No significant foraminal encroachment. No significant central canal stenosis. Upper chest: Apical pleuroparenchymal scarring. Subpleural 6 mm nodular opacity at the right lung apex may be postinfectious or inflammatory related scarring. Nodule not entirely excluded. Aortic atherosclerosis  at the arch. Other: Extracranial carotid arteriosclerosis. IMPRESSION: 1. Atrophy with chronic appearing small vessel ischemic disease. No acute intracranial abnormality. 2. Cervical spondylosis without acute posttraumatic cervical spine fracture or subluxation. 3. Subpleural 6 mm nodular opacity at the right lung apex may be postinfectious or inflammatory in etiology. Pulmonary nodule not entirely excluded. Non-contrast chest CT at 6-12 months is recommended. If the nodule is stable at time of repeat CT, then future CT at 18-24 months (from today's scan) is considered optional for low-risk patients, but is recommended for high-risk patients. This recommendation follows the consensus statement: Guidelines for Management of Incidental Pulmonary Nodules Detected on CT Images: From the Fleischner Society 2017; Radiology 2017; 284:228-243. Electronically Signed   By: Tollie Eth M.D.   On: 11/22/2018 14:07   Ct Cervical Spine Wo Contrast  Result Date: 11/22/2018 CLINICAL DATA:  Patient fell last evening at 2000 hours. No loss of consciousness. Headache. EXAM: CT HEAD WITHOUT CONTRAST CT CERVICAL SPINE WITHOUT CONTRAST TECHNIQUE: Multidetector CT imaging of the head and cervical spine was performed following the standard protocol without intravenous contrast. Multiplanar CT image reconstructions of the cervical spine were also generated. COMPARISON:  09/12/2017 MRI, head CT 12/11/2003 FINDINGS:  CT HEAD FINDINGS Brain: Age related involutional changes the brain with sulcal prominence consistent with superficial atrophy. No hydrocephalus. Mild-to-moderate small vessel ischemic disease of periventricular white matter appears chronic. No large vascular territory infarct, hemorrhage, midline shift or edema. Midline fourth ventricle and basal cisterns without effacement. Brainstem and cerebellum appear intact. No intra-axial mass nor extra-axial fluid collections. Bilateral basal ganglial idiopathic calcifications are noted. Vascular: No hyperdense vessel sign or unexpected calcifications. Moderate atherosclerosis of the carotid siphons. Skull: Intact Sinuses/Orbits: Mild membrane thickening of the ethmoid sinus. The remainder of the paranasal sinuses are unremarkable. No acute ocular abnormality. Other: Clear mastoids. CT CERVICAL SPINE FINDINGS Alignment: Mild grade 1 anterolisthesis of C3 on C4 and C7 on T1 likely degenerative in etiology. Intact atlantodental interval and craniocervical relationship. Skull base and vertebrae: Intact skull base. No acute cervical spine fracture or jumped facets.No suspicious osseous lesions. Soft tissues and spinal canal: No prevertebral fluid or swelling. No visible canal hematoma. Disc levels: Moderate disc flattening at C2-3 with marked disc flattening from C4 through C7. Small posterior marginal osteophytes are noted from C4 through C7. Bilateral mild facet arthropathy is noted. Mild right-sided C3-4 and bilateral C4-5, C5-6 and C6-7 uncovertebral joint osteoarthritis with uncinate spurring. No significant foraminal encroachment. No significant central canal stenosis. Upper chest: Apical pleuroparenchymal scarring. Subpleural 6 mm nodular opacity at the right lung apex may be postinfectious or inflammatory related scarring. Nodule not entirely excluded. Aortic atherosclerosis at the arch. Other: Extracranial carotid arteriosclerosis. IMPRESSION: 1. Atrophy with chronic  appearing small vessel ischemic disease. No acute intracranial abnormality. 2. Cervical spondylosis without acute posttraumatic cervical spine fracture or subluxation. 3. Subpleural 6 mm nodular opacity at the right lung apex may be postinfectious or inflammatory in etiology. Pulmonary nodule not entirely excluded. Non-contrast chest CT at 6-12 months is recommended. If the nodule is stable at time of repeat CT, then future CT at 18-24 months (from today's scan) is considered optional for low-risk patients, but is recommended for high-risk patients. This recommendation follows the consensus statement: Guidelines for Management of Incidental Pulmonary Nodules Detected on CT Images: From the Fleischner Society 2017; Radiology 2017; 284:228-243. Electronically Signed   By: Tollie Eth M.D.   On: 11/22/2018 14:07   Ct L-spine No Charge  Result Date: 11/22/2018  CLINICAL DATA:  Low back pain after a fall last night. Initial encounter. EXAM: CT LUMBAR SPINE WITHOUT CONTRAST TECHNIQUE: Multidetector CT imaging of the lumbar spine was performed without intravenous contrast administration. Multiplanar CT image reconstructions were also generated. COMPARISON:  None. FINDINGS: Segmentation: 5 lumbar type vertebrae. Alignment: Thoracolumbar levoscoliosis. Minimal retrolisthesis of T11 on T12 and L1 on L2. 6 mm anterolisthesis of L4 on L5 and 9 mm anterolisthesis of L5 on S1 with a unilateral right-sided L5 pars defect. Vertebrae: L1 inferior endplate compression fracture with superimposed central Schmorl's node resulting in severe vertebral body height loss centrally, favored to be chronic. No suspicious osseous lesion. Diffuse osteopenia. Paraspinal and other soft tissues: Bilateral posterior paraspinal muscle fatty atrophy. Intra-abdominal contents reported separately. Disc levels: Advanced disc space narrowing at T11-12 and from L2-3 to L5-S1 with degenerative endplate sclerosis, spurring, and vacuum disc at most levels.  L1-2: Disc bulging, spurring, and mild L1 inferior endplate retropulsion result in moderate right neural foraminal stenosis without spinal stenosis. L2-3: Disc bulging, endplate spurring, and mild facet and ligamentum flavum hypertrophy result in mild spinal stenosis and mild bilateral neural foraminal stenosis. L3-4: Disc bulging, endplate spurring, and mild facet and ligamentum flavum hypertrophy result in mild spinal stenosis and mild right and moderate left neural foraminal stenosis. L4-5: Anterolisthesis with bulging uncovered disc and facet hypertrophy result in moderate spinal stenosis and moderate right and severe left neural foraminal stenosis. L5-S1: Anterolisthesis with bulging uncovered disc results in moderate right greater than left neural foraminal stenosis and mild spinal stenosis. IMPRESSION: 1. L1 inferior endplate compression fracture in Schmorl's node deformity, favored to be chronic. 2. Grade 1 anterolisthesis at L4-5 with moderate spinal and moderate to severe neural foraminal stenosis. 3. 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 Electronically Signed   By: Sebastian Ache M.D.   On: 11/22/2018 14:08   Dg Hip Unilat W Or Wo Pelvis 2-3 Views Right  Result Date: 11/22/2018 CLINICAL DATA:  Pt arrives via [facility] from home. Pt reports she had a fall last night about 2000. Pt was unable to get up out of the floor until EMS arrived this morning when son found the pt lying on the floor. Pt complained of lower back and right hip pain. EXAM: DG HIP (WITH OR WITHOUT PELVIS) 2-3V RIGHT COMPARISON:  06/20/2012 FINDINGS: Bones appear radiolucent. There is no acute fracture or subluxation. Mild degenerative changes are seen in the LOWER lumbar spine. There is atherosclerotic calcification of the femoral arteries. IMPRESSION: No evidence for acute abnormality. Electronically Signed   By: Norva Pavlov M.D.   On: 11/22/2018 11:07    Ct Angio Abd/pel W And/or Wo Contrast  Result Date: 11/22/2018 CLINICAL DATA:  fall last night about 2000. Pt reports the fall was mechanical in nature. Pt endorses possible head injury, denies LOC. Denies use blood thinners. Pt was unable to get up out of the floor until EMS arrived this am when son found the pt lying on the floor. Pts only compliant is lumbar pain. Abd mass, pulsatile, AAA suspected. EXAM: CTA ABDOMEN AND PELVIS WITH CONTRAST TECHNIQUE: Multidetector CT imaging of the abdomen and pelvis was performed using the standard protocol during bolus administration of intravenous contrast. Multiplanar reconstructed images and MIPs were obtained and reviewed to evaluate the vascular anatomy. CONTRAST:  ISOVUE-370 IOPAMIDOL (ISOVUE-370) INJECTION 76% COMPARISON:  06/02/2004 FINDINGS: VASCULAR Aorta: Moderate calcified atheromatous plaque. Small penetrating atheromatous ulcer posteriorly in  the infrarenal segment without intramural hematoma. No aneurysm, dissection, or stenosis. Celiac: Patent without evidence of aneurysm, dissection, vasculitis or significant stenosis. SMA: Calcified ostial plaque without significant stenosis, patent distally with classic distal branch anatomy. Renals: Single left, widely patent. Single right, with partially calcified ostial plaque resulting in short segment stenosis of at least mild severity, patent distally. IMA: Patent without evidence of aneurysm, dissection, vasculitis or significant stenosis. Inflow: Moderate tortuosity. Scattered calcified atheromatous plaque. No aneurysm, dissection, or stenosis. Proximal Outflow: Bilateral common femoral and visualized portions of the superficial and profunda femoral arteries are patent without evidence of aneurysm, dissection, vasculitis or significant stenosis. Veins: No obvious venous abnormality within the limitations of this arterial phase study. Review of the MIP images confirms the above findings. NON-VASCULAR  Lower chest: Pectus deformity. No pleural or pericardial effusion. Minimal dependent atelectasis in the visualized lung bases. Hepatobiliary: No focal liver abnormality is seen. No gallstones, gallbladder wall thickening, or biliary dilatation. Pancreas: Unremarkable. No pancreatic ductal dilatation or surrounding inflammatory changes. Spleen: Normal in size without focal abnormality. Adrenals/Urinary Tract: Normal adrenals. 1.7 cm upper pole left renal cyst. 2.8 cm right lower pole renal cyst. No hydronephrosis. Urinary bladder physiologically distended. Stomach/Bowel: Stomach and small bowel are nondilated. Appendix surgically absent. Colon is nondilated. Scattered sigmoid diverticula without adjacent inflammatory/edematous change or abscess. Lymphatic: No abdominal or pelvic adenopathy localized. Reproductive: Status post hysterectomy. No adnexal masses. Other: No ascites. No free air. Musculoskeletal: L1 compression deformity with 50% loss of height anteriorly , age indeterminate. Mild thoracolumbar scoliosis. Multilevel spondylitic changes. Grade 1 anterolisthesis L4-5 and L5-S1 probably related to bilateral facet DJD. IMPRESSION: VASCULAR 1. No acute findings. 2. Aortoiliac Atherosclerosis (ICD10-170.0) without aneurysm. 3. Right renal artery ostial stenosis of possible hemodynamic significance. NON-VASCULAR 1. L1 compression deformity, age indeterminate. 2. Sigmoid diverticulosis. Electronically Signed   By: Corlis Leak M.D.   On: 11/22/2018 14:04    Procedures Procedures (including critical care time)  Medications Ordered in ED Medications  sodium chloride (PF) 0.9 % injection (has no administration in time range)  iopamidol (ISOVUE-370) 76 % injection (has no administration in time range)  sodium chloride (PF) 0.9 % injection (has no administration in time range)  acetaminophen (TYLENOL) solution 500 mg (has no administration in time range)  levothyroxine (SYNTHROID, LEVOTHROID) tablet 12.5 mcg  (has no administration in time range)  docusate sodium (COLACE) capsule 100 mg (has no administration in time range)  sodium chloride flush (NS) 0.9 % injection 3 mL (3 mLs Intravenous Not Given 11/22/18 2041)  0.9 % NaCl with KCl 40 mEq / L  infusion (50 mL/hr Intravenous New Bag/Given 11/22/18 1816)  albuterol (PROVENTIL) (2.5 MG/3ML) 0.083% nebulizer solution 2.5 mg (has no administration in time range)  cefTRIAXone (ROCEPHIN) 1 g in sodium chloride 0.9 % 100 mL IVPB (1 g Intravenous New Bag/Given 11/22/18 1818)  sodium chloride 0.9 % bolus 1,000 mL (0 mLs Intravenous Stopped 11/22/18 1040)  acetaminophen (TYLENOL) solution 500 mg (500 mg Oral Given 11/22/18 1108)  iopamidol (ISOVUE-370) 76 % injection 100 mL (100 mLs Intravenous Contrast Given 11/22/18 1300)  potassium chloride SA (K-DUR,KLOR-CON) CR tablet 40 mEq (40 mEq Oral Given 11/22/18 1758)     Initial Impression / Assessment and Plan / ED Course  I have reviewed the triage vital signs and the nursing notes.  Pertinent labs & imaging results that were available during my care of the patient were reviewed by me and considered in my medical decision making (see chart for details).  84yo female with history above presents with concern for suspected syncope last night with unknown duration of unconsciousness, awaking on the floor this AM.  Patient on ROS also reports abdominal pain, nausea, vomiting.   CT head done showing no acute findings. CT CSpine without acute findings. CTA abdomen pelvis ordered given abdominal, back pain, pulsatile aorta on exam, shows no acute findings.  UA without infection. No anemia. Mild hypokalemia. EKG without acute findings.  XR with question of infiltrate right apex however CT CS shows this appears to be nodule or postinfectious. Discussed with family. Given this, do not feel pneumonia likely.  CT Lspine shows L1 compression fracture, however age indeterminate, favor chronic, and pt without L1  tenderness.  Patient with syncopal episode while I was talking with her in the ED prior to being placed on monitor, and family reports recent episode also on christmas which was brief then returned to baseline.   While patient is DNR, she is not full comfort care and lives independently. Given multiple syncopal episodes, family and patient would like admission for syncope work up. Feel she would benefit also from PT for back pain after fall.   Final Clinical Impressions(s) / ED Diagnoses   Final diagnoses:  Back pain  Syncope, unspecified syncope type  Fall, initial encounter    ED Discharge Orders    None       Alvira MondaySchlossman, Rondall Radigan, MD 11/22/18 2159

## 2018-11-22 NOTE — ED Notes (Signed)
ED TO INPATIENT HANDOFF REPORT  Name/Age/Gender Tara Nolan 84 y.o. female  Code Status Code Status History    Date Active Date Inactive Code Status Order ID Comments User Context   09/08/2017 0322 09/12/2017 1930 DNR [id number]  Clydie Braun, MD ED   06/20/2012 1616 06/23/2012 1904 DNR [id number]  Deneise Lever, RN Inpatient    Questions for Most Recent Historical Code Status (Order [id number])    Question Answer Comment   In the event of cardiac or respiratory ARREST Do not call a "code blue"    In the event of cardiac or respiratory ARREST Do not perform Intubation, CPR, defibrillation or ACLS    In the event of cardiac or respiratory ARREST Use medication by any route, position, wound care, and other measures to relive pain and suffering. May use oxygen, suction and manual treatment of airway obstruction as needed for comfort.         Advance Directive Documentation     Most Recent Value  Type of Advance Directive  Living will  Pre-existing out of facility DNR order (yellow form or pink MOST form)  -  "MOST" Form in Place?  -      Home/SNF/Other Home  Chief Complaint fall; back pain  Level of Care/Admitting Diagnosis ED Disposition    ED Disposition Condition Comment   Admit  Hospital Area: [facility] [[id number]]  Level of Care: Telemetry [5]  Admit to tele based on following criteria: Eval of Syncope  Diagnosis: Syncope and collapse [780.2.ICD-9-CM]  Admitting Physician: Elease Etienne [[id number]]  Attending Physician: Marcellus Scott D [[id number]]  PT Class (Do Not Modify): Observation [104]  PT Acc Code (Do Not Modify): Observation [10022]       Medical History Past Medical History:  Diagnosis Date  . Stroke (HCC)   . Thyroid disease     Allergies Allergies  Allergen Reactions  . Other Other (See Comments)    All medications make me bonkers     IV Location/Drains/Wounds Patient Lines/Drains/Airways Status   Active  Line/Drains/Airways    Name:   Placement date:   Placement time:   Site:   Days:   Peripheral IV 11/22/18 Right Forearm   11/22/18    -    Forearm   less than 1   Peripheral IV 11/22/18 Right;Upper Arm   11/22/18    1038    Arm   less than 1          Labs/Imaging Results for orders placed or performed during the hospital encounter of 11/22/18 (from the past 48 hour(s))  CBC with Differential     Status: Abnormal   Collection Time: 11/22/18  9:25 AM  Result Value Ref Range   WBC 16.0 (H) 4.0 - 10.5 K/uL   RBC 3.44 (L) 3.87 - 5.11 MIL/uL   Hemoglobin 11.0 (L) 12.0 - 15.0 g/dL   HCT 96.2 (L) 95.2 - 84.1 %   MCV 99.1 80.0 - 100.0 fL   MCH 32.0 26.0 - 34.0 pg   MCHC 32.3 30.0 - 36.0 g/dL   RDW 32.4 (H) 40.1 - 02.7 %   Platelets 147 (L) 150 - 400 K/uL   nRBC 0.0 0.0 - 0.2 %   Neutrophils Relative % 91 %   Neutro Abs 14.5 (H) 1.7 - 7.7 K/uL   Lymphocytes Relative 3 %   Lymphs Abs 0.5 (L) 0.7 - 4.0 K/uL   Monocytes Relative 5 %   Monocytes Absolute 0.8 0.1 -  1.0 K/uL   Eosinophils Relative 0 %   Eosinophils Absolute 0.0 0.0 - 0.5 K/uL   Basophils Relative 0 %   Basophils Absolute 0.0 0.0 - 0.1 K/uL   Immature Granulocytes 1 %   Abs Immature Granulocytes 0.18 (H) 0.00 - 0.07 K/uL    Comment: Performed at [facility], [address] [place], [place] [address]  Urinalysis, Routine w reflex microscopic     Status: Abnormal   Collection Time: 11/22/18  9:25 AM  Result Value Ref Range   Color, Urine STRAW (A) YELLOW   APPearance CLEAR CLEAR   Specific Gravity, Urine 1.006 1.005 - 1.030   pH 8.0 5.0 - 8.0   Glucose, UA NEGATIVE NEGATIVE mg/dL   Hgb urine dipstick SMALL (A) NEGATIVE   Bilirubin Urine NEGATIVE NEGATIVE   Ketones, ur NEGATIVE NEGATIVE mg/dL   Protein, ur 30 (A) NEGATIVE mg/dL   Nitrite NEGATIVE NEGATIVE   Leukocytes, UA NEGATIVE NEGATIVE   RBC / HPF 0-5 0 - 5 RBC/hpf   WBC, UA 0-5 0 - 5 WBC/hpf   Bacteria, UA RARE (A) NONE SEEN    Comment:  Performed at [facility], [address] [place], [place] [address]  [place]     Status: None   Collection Time: 11/22/18 10:36 AM  Result Value Ref Range   Total CK 229 38 - 234 U/L    Comment: Performed at [facility], [address] [place], [place] [address]  Comprehensive metabolic panel     Status: Abnormal   Collection Time: 11/22/18 10:36 AM  Result Value Ref Range   Sodium 141 135 - 145 mmol/L   Potassium 3.1 (L) 3.5 - 5.1 mmol/L   Chloride 107 98 - 111 mmol/L   CO2 23 22 - 32 mmol/L   Glucose, Bld 104 (H) 70 - 99 mg/dL   BUN 13 8 - 23 mg/dL   Creatinine, Ser 9.140.87 0.44 - 1.00 mg/dL   Calcium 8.4 (L) 8.9 - 10.3 mg/dL   Total Protein 7.2 6.5 - 8.1 g/dL   Albumin 4.3 3.5 - 5.0 g/dL   AST 35 15 - 41 U/L   ALT 19 0 - 44 U/L   Alkaline Phosphatase 44 38 - 126 U/L   Total Bilirubin 1.6 (H) 0.3 - 1.2 mg/dL   GFR calc non Af Amer 56 (L) >60 mL/min   GFR calc Af Amer >60 >60 mL/min   Anion gap 11 5 - 15    Comment: Performed at [facility], [address] [place], [place] [address]  Lipase, blood     Status: None   Collection Time: 11/22/18 10:36 AM  Result Value Ref Range   Lipase 31 11 - 51 U/L    Comment: Performed at [facility], [address] [place], [place] [address]   Dg Chest 2 View  Result Date: 11/22/2018 CLINICAL DATA:  Pt arrives via [facility] from home. Pt reports she had a fall last night about 2000. Pt was unable to get up out of the floor until EMS arrived this morning when son found the pt lying on the floor. Pt complained of lower back and right hip pain. EXAM: CHEST - 2 VIEW COMPARISON:  09/07/2017 FINDINGS: The heart is mildly enlarged and stable in configuration. The aorta is tortuous and partially calcified. There is prominence of interstitial markings, likely chronic. Focal asymmetric opacities are identified in the RIGHT lung apex, raising question of infiltrate  or underlying  nodule. Recommend follow-up chest x-ray and possible CT if there is failure of these areas to resolve. Scoliosis, convex LEFT. There are numerous thoracic and UPPER lumbar which compression fractures. The age of these fractures is indeterminate. There is no pneumothorax. No acute displaced rib fractures. IMPRESSION: 1. Cardiomegaly. 2. Stable prominence of interstitial markings. 3. Focal opacity in the RIGHT lung apex possibly representing infiltrate versus pulmonary nodules. Follow-up is recommended. Recommend follow-up PA and LATERAL chest to determine if these are persistent. Consider CT of the chest if findings persist. Electronically Signed   By: Norva Pavlov M.D.   On: 11/22/2018 11:03   Ct Head Wo Contrast  Result Date: 11/22/2018 CLINICAL DATA:  Patient fell last evening at 2000 hours. No loss of consciousness. Headache. EXAM: CT HEAD WITHOUT CONTRAST CT CERVICAL SPINE WITHOUT CONTRAST TECHNIQUE: Multidetector CT imaging of the head and cervical spine was performed following the standard protocol without intravenous contrast. Multiplanar CT image reconstructions of the cervical spine were also generated. COMPARISON:  09/12/2017 MRI, head CT 12/11/2003 FINDINGS: CT HEAD FINDINGS Brain: Age related involutional changes the brain with sulcal prominence consistent with superficial atrophy. No hydrocephalus. Mild-to-moderate small vessel ischemic disease of periventricular white matter appears chronic. No large vascular territory infarct, hemorrhage, midline shift or edema. Midline fourth ventricle and basal cisterns without effacement. Brainstem and cerebellum appear intact. No intra-axial mass nor extra-axial fluid collections. Bilateral basal ganglial idiopathic calcifications are noted. Vascular: No hyperdense vessel sign or unexpected calcifications. Moderate atherosclerosis of the carotid siphons. Skull: Intact Sinuses/Orbits: Mild membrane thickening of the ethmoid sinus. The remainder of the  paranasal sinuses are unremarkable. No acute ocular abnormality. Other: Clear mastoids. CT CERVICAL SPINE FINDINGS Alignment: Mild grade 1 anterolisthesis of C3 on C4 and C7 on T1 likely degenerative in etiology. Intact atlantodental interval and craniocervical relationship. Skull base and vertebrae: Intact skull base. No acute cervical spine fracture or jumped facets.No suspicious osseous lesions. Soft tissues and spinal canal: No prevertebral fluid or swelling. No visible canal hematoma. Disc levels: Moderate disc flattening at C2-3 with marked disc flattening from C4 through C7. Small posterior marginal osteophytes are noted from C4 through C7. Bilateral mild facet arthropathy is noted. Mild right-sided C3-4 and bilateral C4-5, C5-6 and C6-7 uncovertebral joint osteoarthritis with uncinate spurring. No significant foraminal encroachment. No significant central canal stenosis. Upper chest: Apical pleuroparenchymal scarring. Subpleural 6 mm nodular opacity at the right lung apex may be postinfectious or inflammatory related scarring. Nodule not entirely excluded. Aortic atherosclerosis at the arch. Other: Extracranial carotid arteriosclerosis. IMPRESSION: 1. Atrophy with chronic appearing small vessel ischemic disease. No acute intracranial abnormality. 2. Cervical spondylosis without acute posttraumatic cervical spine fracture or subluxation. 3. Subpleural 6 mm nodular opacity at the right lung apex may be postinfectious or inflammatory in etiology. Pulmonary nodule not entirely excluded. Non-contrast chest CT at 6-12 months is recommended. If the nodule is stable at time of repeat CT, then future CT at 18-24 months (from today's scan) is considered optional for low-risk patients, but is recommended for high-risk patients. This recommendation follows the consensus statement: Guidelines for Management of Incidental Pulmonary Nodules Detected on CT Images: From the Fleischner Society 2017; Radiology 2017;  284:228-243. Electronically Signed   By: Tollie Eth M.D.   On: 11/22/2018 14:07   Ct Cervical Spine Wo Contrast  Result Date: 11/22/2018 CLINICAL DATA:  Patient fell last evening at 2000 hours. No loss of consciousness. Headache. EXAM: CT HEAD WITHOUT CONTRAST CT CERVICAL SPINE WITHOUT  CONTRAST TECHNIQUE: Multidetector CT imaging of the head and cervical spine was performed following the standard protocol without intravenous contrast. Multiplanar CT image reconstructions of the cervical spine were also generated. COMPARISON:  09/12/2017 MRI, head CT 12/11/2003 FINDINGS: CT HEAD FINDINGS Brain: Age related involutional changes the brain with sulcal prominence consistent with superficial atrophy. No hydrocephalus. Mild-to-moderate small vessel ischemic disease of periventricular white matter appears chronic. No large vascular territory infarct, hemorrhage, midline shift or edema. Midline fourth ventricle and basal cisterns without effacement. Brainstem and cerebellum appear intact. No intra-axial mass nor extra-axial fluid collections. Bilateral basal ganglial idiopathic calcifications are noted. Vascular: No hyperdense vessel sign or unexpected calcifications. Moderate atherosclerosis of the carotid siphons. Skull: Intact Sinuses/Orbits: Mild membrane thickening of the ethmoid sinus. The remainder of the paranasal sinuses are unremarkable. No acute ocular abnormality. Other: Clear mastoids. CT CERVICAL SPINE FINDINGS Alignment: Mild grade 1 anterolisthesis of C3 on C4 and C7 on T1 likely degenerative in etiology. Intact atlantodental interval and craniocervical relationship. Skull base and vertebrae: Intact skull base. No acute cervical spine fracture or jumped facets.No suspicious osseous lesions. Soft tissues and spinal canal: No prevertebral fluid or swelling. No visible canal hematoma. Disc levels: Moderate disc flattening at C2-3 with marked disc flattening from C4 through C7. Small posterior marginal  osteophytes are noted from C4 through C7. Bilateral mild facet arthropathy is noted. Mild right-sided C3-4 and bilateral C4-5, C5-6 and C6-7 uncovertebral joint osteoarthritis with uncinate spurring. No significant foraminal encroachment. No significant central canal stenosis. Upper chest: Apical pleuroparenchymal scarring. Subpleural 6 mm nodular opacity at the right lung apex may be postinfectious or inflammatory related scarring. Nodule not entirely excluded. Aortic atherosclerosis at the arch. Other: Extracranial carotid arteriosclerosis. IMPRESSION: 1. Atrophy with chronic appearing small vessel ischemic disease. No acute intracranial abnormality. 2. Cervical spondylosis without acute posttraumatic cervical spine fracture or subluxation. 3. Subpleural 6 mm nodular opacity at the right lung apex may be postinfectious or inflammatory in etiology. Pulmonary nodule not entirely excluded. Non-contrast chest CT at 6-12 months is recommended. If the nodule is stable at time of repeat CT, then future CT at 18-24 months (from today's scan) is considered optional for low-risk patients, but is recommended for high-risk patients. This recommendation follows the consensus statement: Guidelines for Management of Incidental Pulmonary Nodules Detected on CT Images: From the Fleischner Society 2017; Radiology 2017; 284:228-243. Electronically Signed   By: Tollie Eth M.D.   On: 11/22/2018 14:07   Ct L-spine No Charge  Result Date: 11/22/2018 CLINICAL DATA:  Low back pain after a fall last night. Initial encounter. EXAM: CT LUMBAR SPINE WITHOUT CONTRAST TECHNIQUE: Multidetector CT imaging of the lumbar spine was performed without intravenous contrast administration. Multiplanar CT image reconstructions were also generated. COMPARISON:  None. FINDINGS: Segmentation: 5 lumbar type vertebrae. Alignment: Thoracolumbar levoscoliosis. Minimal retrolisthesis of T11 on T12 and L1 on L2. 6 mm anterolisthesis of L4 on L5 and 9 mm  anterolisthesis of L5 on S1 with a unilateral right-sided L5 pars defect. Vertebrae: L1 inferior endplate compression fracture with superimposed central Schmorl's node resulting in severe vertebral body height loss centrally, favored to be chronic. No suspicious osseous lesion. Diffuse osteopenia. Paraspinal and other soft tissues: Bilateral posterior paraspinal muscle fatty atrophy. Intra-abdominal contents reported separately. Disc levels: Advanced disc space narrowing at T11-12 and from L2-3 to L5-S1 with degenerative endplate sclerosis, spurring, and vacuum disc at most levels. L1-2: Disc bulging, spurring, and mild L1 inferior endplate retropulsion result in moderate right neural foraminal  stenosis without spinal stenosis. L2-3: Disc bulging, endplate spurring, and mild facet and ligamentum flavum hypertrophy result in mild spinal stenosis and mild bilateral neural foraminal stenosis. L3-4: Disc bulging, endplate spurring, and mild facet and ligamentum flavum hypertrophy result in mild spinal stenosis and mild right and moderate left neural foraminal stenosis. L4-5: Anterolisthesis with bulging uncovered disc and facet hypertrophy result in moderate spinal stenosis and moderate right and severe left neural foraminal stenosis. L5-S1: Anterolisthesis with bulging uncovered disc results in moderate right greater than left neural foraminal stenosis and mild spinal stenosis. IMPRESSION: 1. L1 inferior endplate compression fracture in Schmorl's node deformity, favored to be chronic. 2. Grade 1 anterolisthesis at L4-5 with moderate spinal and moderate to severe neural foraminal stenosis. 3. 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 Electronically Signed   By: Sebastian Ache M.D.   On: 11/22/2018 14:08   Dg Hip Unilat W Or Wo Pelvis 2-3 Views Right  Result Date: 11/22/2018 CLINICAL DATA:  Pt arrives via [facility] from [facility]. Pt reports she had a  fall last night about 2000. Pt was unable to get up out of the floor until EMS arrived this morning when son found the pt lying on the floor. Pt complained of lower back and right hip pain. EXAM: DG HIP (WITH OR WITHOUT PELVIS) 2-3V RIGHT COMPARISON:  06/20/2012 FINDINGS: Bones appear radiolucent. There is no acute fracture or subluxation. Mild degenerative changes are seen in the LOWER lumbar spine. There is atherosclerotic calcification of the femoral arteries. IMPRESSION: No evidence for acute abnormality. Electronically Signed   By: Norva Pavlov M.D.   On: 11/22/2018 11:07   Ct Angio Abd/pel W And/or Wo Contrast  Result Date: 11/22/2018 CLINICAL DATA:  fall last night about 2000. Pt reports the fall was mechanical in nature. Pt endorses possible head injury, denies LOC. Denies use blood thinners. Pt was unable to get up out of the floor until EMS arrived this am when son found the pt lying on the floor. Pts only compliant is lumbar pain. Abd mass, pulsatile, AAA suspected. EXAM: CTA ABDOMEN AND PELVIS WITH CONTRAST TECHNIQUE: Multidetector CT imaging of the abdomen and pelvis was performed using the standard protocol during bolus administration of intravenous contrast. Multiplanar reconstructed images and MIPs were obtained and reviewed to evaluate the vascular anatomy. CONTRAST:  ISOVUE-370 IOPAMIDOL (ISOVUE-370) INJECTION 76% COMPARISON:  06/02/2004 FINDINGS: VASCULAR Aorta: Moderate calcified atheromatous plaque. Small penetrating atheromatous ulcer posteriorly in the infrarenal segment without intramural hematoma. No aneurysm, dissection, or stenosis. Celiac: Patent without evidence of aneurysm, dissection, vasculitis or significant stenosis. SMA: Calcified ostial plaque without significant stenosis, patent distally with classic distal branch anatomy. Renals: Single left, widely patent. Single right, with partially calcified ostial plaque resulting in short segment stenosis of at least mild  severity, patent distally. IMA: Patent without evidence of aneurysm, dissection, vasculitis or significant stenosis. Inflow: Moderate tortuosity. Scattered calcified atheromatous plaque. No aneurysm, dissection, or stenosis. Proximal Outflow: Bilateral common femoral and visualized portions of the superficial and profunda femoral arteries are patent without evidence of aneurysm, dissection, vasculitis or significant stenosis. Veins: No obvious venous abnormality within the limitations of this arterial phase study. Review of the MIP images confirms the above findings. NON-VASCULAR Lower chest: Pectus deformity. No pleural or pericardial effusion. Minimal dependent atelectasis in the visualized lung bases. Hepatobiliary: No focal liver abnormality is seen. No gallstones, gallbladder wall thickening, or biliary dilatation. Pancreas: Unremarkable. No pancreatic  ductal dilatation or surrounding inflammatory changes. Spleen: Normal in size without focal abnormality. Adrenals/Urinary Tract: Normal adrenals. 1.7 cm upper pole left renal cyst. 2.8 cm right lower pole renal cyst. No hydronephrosis. Urinary bladder physiologically distended. Stomach/Bowel: Stomach and small bowel are nondilated. Appendix surgically absent. Colon is nondilated. Scattered sigmoid diverticula without adjacent inflammatory/edematous change or abscess. Lymphatic: No abdominal or pelvic adenopathy localized. Reproductive: Status post hysterectomy. No adnexal masses. Other: No ascites. No free air. Musculoskeletal: L1 compression deformity with 50% loss of height anteriorly , age indeterminate. Mild thoracolumbar scoliosis. Multilevel spondylitic changes. Grade 1 anterolisthesis L4-5 and L5-S1 probably related to bilateral facet DJD. IMPRESSION: VASCULAR 1. No acute findings. 2. Aortoiliac Atherosclerosis (ICD10-170.0) without aneurysm. 3. Right renal artery ostial stenosis of possible hemodynamic significance. NON-VASCULAR 1. L1 compression  deformity, age indeterminate. 2. Sigmoid diverticulosis. Electronically Signed   By: Corlis Leak  Hassell M.D.   On: 11/22/2018 14:04    Pending Labs Unresulted Labs (From admission, onward)    Start     Ordered   11/22/18 0902  Urine culture  ONCE - STAT,   STAT     11/22/18 0905          Vitals/Pain Today's Vitals   11/22/18 1245 11/22/18 1330 11/22/18 1400 11/22/18 1430  BP:  (!) 116/91 (!) 141/78 (!) 142/67  Pulse: 86 83 88 82  Resp: (!) 21 18 14 17   Temp:      TempSrc:      SpO2: 95% 95% 95% 92%  Weight:      PainSc:        Isolation Precautions No active isolations  Medications Medications  sodium chloride (PF) 0.9 % injection (has no administration in time range)  iopamidol (ISOVUE-370) 76 % injection (has no administration in time range)  sodium chloride (PF) 0.9 % injection (has no administration in time range)  sodium chloride 0.9 % bolus 1,000 mL (0 mLs Intravenous Stopped 11/22/18 1040)  acetaminophen (TYLENOL) solution 500 mg (500 mg Oral Given 11/22/18 1108)  iopamidol (ISOVUE-370) 76 % injection 100 mL (100 mLs Intravenous Contrast Given 11/22/18 1300)    Mobility walks with device

## 2018-11-22 NOTE — ED Notes (Signed)
Pt and family updated on plan of care  

## 2018-11-22 NOTE — ED Notes (Signed)
Pt placed on purewick 

## 2018-11-22 NOTE — ED Notes (Signed)
ED Provider at bedside. 

## 2018-11-22 NOTE — ED Notes (Signed)
Hospitalist at bedside 

## 2018-11-23 ENCOUNTER — Observation Stay (HOSPITAL_BASED_OUTPATIENT_CLINIC_OR_DEPARTMENT_OTHER): Payer: Medicare Other

## 2018-11-23 DIAGNOSIS — R569 Unspecified convulsions: Secondary | ICD-10-CM | POA: Diagnosis not present

## 2018-11-23 DIAGNOSIS — B9620 Unspecified Escherichia coli [E. coli] as the cause of diseases classified elsewhere: Secondary | ICD-10-CM | POA: Diagnosis present

## 2018-11-23 DIAGNOSIS — R402142 Coma scale, eyes open, spontaneous, at arrival to emergency department: Secondary | ICD-10-CM | POA: Diagnosis present

## 2018-11-23 DIAGNOSIS — R55 Syncope and collapse: Secondary | ICD-10-CM | POA: Diagnosis present

## 2018-11-23 DIAGNOSIS — I272 Pulmonary hypertension, unspecified: Secondary | ICD-10-CM | POA: Diagnosis present

## 2018-11-23 DIAGNOSIS — I340 Nonrheumatic mitral (valve) insufficiency: Secondary | ICD-10-CM

## 2018-11-23 DIAGNOSIS — G479 Sleep disorder, unspecified: Secondary | ICD-10-CM | POA: Diagnosis present

## 2018-11-23 DIAGNOSIS — G47419 Narcolepsy without cataplexy: Secondary | ICD-10-CM | POA: Diagnosis not present

## 2018-11-23 DIAGNOSIS — M25559 Pain in unspecified hip: Secondary | ICD-10-CM | POA: Diagnosis present

## 2018-11-23 DIAGNOSIS — D649 Anemia, unspecified: Secondary | ICD-10-CM | POA: Diagnosis present

## 2018-11-23 DIAGNOSIS — M48061 Spinal stenosis, lumbar region without neurogenic claudication: Secondary | ICD-10-CM | POA: Diagnosis present

## 2018-11-23 DIAGNOSIS — D696 Thrombocytopenia, unspecified: Secondary | ICD-10-CM | POA: Diagnosis present

## 2018-11-23 DIAGNOSIS — E890 Postprocedural hypothyroidism: Secondary | ICD-10-CM | POA: Diagnosis not present

## 2018-11-23 DIAGNOSIS — M549 Dorsalgia, unspecified: Secondary | ICD-10-CM | POA: Diagnosis present

## 2018-11-23 DIAGNOSIS — I10 Essential (primary) hypertension: Secondary | ICD-10-CM | POA: Diagnosis present

## 2018-11-23 DIAGNOSIS — R402252 Coma scale, best verbal response, oriented, at arrival to emergency department: Secondary | ICD-10-CM | POA: Diagnosis present

## 2018-11-23 DIAGNOSIS — M4856XA Collapsed vertebra, not elsewhere classified, lumbar region, initial encounter for fracture: Secondary | ICD-10-CM | POA: Diagnosis present

## 2018-11-23 DIAGNOSIS — W19XXXA Unspecified fall, initial encounter: Secondary | ICD-10-CM | POA: Diagnosis present

## 2018-11-23 DIAGNOSIS — Z66 Do not resuscitate: Secondary | ICD-10-CM | POA: Diagnosis present

## 2018-11-23 DIAGNOSIS — J189 Pneumonia, unspecified organism: Secondary | ICD-10-CM | POA: Diagnosis present

## 2018-11-23 DIAGNOSIS — M545 Low back pain: Secondary | ICD-10-CM | POA: Diagnosis present

## 2018-11-23 DIAGNOSIS — N390 Urinary tract infection, site not specified: Secondary | ICD-10-CM | POA: Diagnosis present

## 2018-11-23 DIAGNOSIS — R0789 Other chest pain: Secondary | ICD-10-CM | POA: Diagnosis present

## 2018-11-23 DIAGNOSIS — Y92001 Dining room of unspecified non-institutional (private) residence as the place of occurrence of the external cause: Secondary | ICD-10-CM | POA: Diagnosis not present

## 2018-11-23 DIAGNOSIS — G8929 Other chronic pain: Secondary | ICD-10-CM | POA: Diagnosis present

## 2018-11-23 DIAGNOSIS — T381X6A Underdosing of thyroid hormones and substitutes, initial encounter: Secondary | ICD-10-CM | POA: Diagnosis present

## 2018-11-23 DIAGNOSIS — I6523 Occlusion and stenosis of bilateral carotid arteries: Secondary | ICD-10-CM | POA: Diagnosis present

## 2018-11-23 DIAGNOSIS — R402362 Coma scale, best motor response, obeys commands, at arrival to emergency department: Secondary | ICD-10-CM | POA: Diagnosis present

## 2018-11-23 DIAGNOSIS — E876 Hypokalemia: Secondary | ICD-10-CM | POA: Diagnosis present

## 2018-11-23 LAB — BASIC METABOLIC PANEL
Anion gap: 8 (ref 5–15)
BUN: 16 mg/dL (ref 8–23)
CO2: 21 mmol/L — ABNORMAL LOW (ref 22–32)
CREATININE: 0.86 mg/dL (ref 0.44–1.00)
Calcium: 8 mg/dL — ABNORMAL LOW (ref 8.9–10.3)
Chloride: 111 mmol/L (ref 98–111)
GFR calc Af Amer: >60 mL/min (ref >60)
GFR calc non Af Amer: 57 mL/min — ABNORMAL LOW (ref >60)
Glucose, Bld: 78 mg/dL (ref 70–99)
Potassium: 4.1 mmol/L (ref 3.5–5.1)
Sodium: 140 mmol/L (ref 135–145)

## 2018-11-23 LAB — CBC
HCT: 32.9 % — ABNORMAL LOW (ref 36.0–46.0)
Hemoglobin: 10.5 g/dL — ABNORMAL LOW (ref 12.0–15.0)
MCH: 31.5 pg (ref 26.0–34.0)
MCHC: 31.9 g/dL (ref 30.0–36.0)
MCV: 98.8 fL (ref 80.0–100.0)
Platelets: 133 10*3/uL — ABNORMAL LOW (ref 150–400)
RBC: 3.33 MIL/uL — ABNORMAL LOW (ref 3.87–5.11)
RDW: 15.9 % — AB (ref 11.5–15.5)
WBC: 13.2 10*3/uL — ABNORMAL HIGH (ref 4.0–10.5)
nRBC: 0 % (ref 0.0–0.2)

## 2018-11-23 LAB — ECHOCARDIOGRAM COMPLETE: Weight: 1536 oz

## 2018-11-23 LAB — TROPONIN I
Troponin I: 0.04 ng/mL (ref <0.03)
Troponin I: 0.04 ng/mL (ref <0.03)

## 2018-11-23 MED ORDER — HYDRALAZINE HCL 20 MG/ML IJ SOLN
5.0000 mg | Freq: Three times a day (TID) | INTRAMUSCULAR | Status: DC | PRN
  Administered 2018-11-23 – 2018-11-24 (×2): 5 mg via INTRAVENOUS
  Filled 2018-11-23 (×2): qty 1

## 2018-11-23 NOTE — Evaluation (Signed)
Physical Therapy Evaluation Patient Details Name: Tara Nolan MRN: [id number] DOB: 04/20/1919 Today's Date: 11/23/2018   History of Present Illness  84 year old female, lives alone, ambulates with the help of a walker. [facility]2018 due to syncope and fall at home on night prior to admission. PMH of hypothyroid who is noncompliant with Synthroid, CVA, prior hospitalization in October 2018 when she was extensively evaluated for syncope without etiology, was admitted because of a fall and left wrist fracture. patient [facility] bed available for continuous EEG monitoring  Clinical Impression  Pt limited  On eval today due to high BPs during session. Pt moved well with standing and stepping to get into the recliner. Will continue to follow and assess as pt is in acute care.     Follow Up Recommendations Home health PT;Supervision/Assistance - 24 hour(If family cannot provide 24/7 initally then recommend [facility]-SNF )    Equipment Recommendations  None recommended by PT    Recommendations for Other Services       Precautions / Restrictions Precautions Precautions: Fall Precaution Comments: watch BP Restrictions Weight Bearing Restrictions: No      Mobility  Bed Mobility Overal bed mobility: Needs Assistance Bed Mobility: Supine to Sit;Sit to Supine     Supine to sit: Supervision Sit to supine: Supervision      Transfers Overall transfer level: Needs assistance Equipment used: Rolling walker (2 wheeled) Transfers: Sit to/from Stand Sit to Stand: Min assist         General transfer comment: Pt tolerated transfer today well, limited session due to veryhigh BPs at this time  Ambulation/Gait Ambulation/Gait assistance: (feeel pt would have been able to ambulate however had to limit her mobility due to her high BPs )              Stairs            Wheelchair Mobility    Modified Rankin (Stroke Patients Only)       Balance Overall balance assessment: History of Falls;Needs assistance   Sitting balance-Leahy Scale: Good       Standing balance-Leahy Scale: Fair                               Pertinent Vitals/Pain Pain Assessment: No/denies pain Faces Pain Scale: Hurts a little bit Pain Location: my back  Pain Descriptors / Indicators: Aching Pain Intervention(s): Monitored during session    Home Living Family/patient expects to be discharged to:: Private residence Living Arrangements: Alone Available Help at Discharge: Family;Friend(s);Available PRN/intermittently Type of Home: House Home Access: Stairs to enter Entrance Stairs-Rails: None Entrance Stairs-Number of Steps: 1 Home Layout: One level Home Equipment: Walker - 4 wheels;Bedside commode      Prior Function Level of Independence: Independent with assistive device(s)         Comments: using rollator     Hand Dominance   Dominant Hand: Right    Extremity/Trunk Assessment        Lower Extremity Assessment Lower Extremity Assessment: Generalized weakness       Communication   Communication: HOH  Cognition Arousal/Alertness: Awake/alert Behavior During Therapy: WFL for tasks assessed/performed Overall Cognitive Status: History of cognitive impairments - at baseline                                 General  Comments: Pt tangential; apparent STM deficits; deficits with reasoning/insight/judgement and awareness      General Comments      Exercises     Assessment/Plan    PT Assessment Patient needs continued PT services  PT Problem List Decreased activity tolerance;Decreased mobility       PT Treatment Interventions Gait training;Functional mobility training;Therapeutic activities;Therapeutic exercise;Patient/family education    PT Goals (Current goals can be found in the Care Plan section)  Acute Rehab PT Goals Patient Stated Goal: "to go be with my family" referring to  family who has passed away PT Goal Formulation: With patient Time For Goal Achievement: 12/07/18 Potential to Achieve Goals: Good    Frequency Min 3X/week   Barriers to discharge        Co-evaluation               AM-PAC PT "6 Clicks" Mobility  Outcome Measure Help needed turning from your back to your side while in a flat bed without using bedrails?: A Little Help needed moving from lying on your back to sitting on the side of a flat bed without using bedrails?: A Little Help needed moving to and from a bed to a chair (including a wheelchair)?: A Little Help needed standing up from a chair using your arms (e.g., wheelchair or bedside chair)?: A Little Help needed to walk in hospital room?: A Little Help needed climbing 3-5 steps with a railing? : A Little 6 Click Score: 18    End of Session Equipment Utilized During Treatment: Gait belt Activity Tolerance: Treatment limited secondary to medical complications (Comment) Patient left: with chair alarm set;with call bell/phone within reach;with family/visitor present Nurse Communication: Mobility status PT Visit Diagnosis: Muscle weakness (generalized) (M62.81);History of falling (Z91.81)    Time: [id number] PT Time Calculation (min) (ACUTE ONLY): 46 min   Charges:   PT Evaluation $PT Eval Low Complexity: 1 Low PT Treatments $Therapeutic Activity: 23-37 mins       Tara Nolan, PT Acute Rehabilitation Services Pager: [phone] Office: [phone] 11/23/2018   Tara BileBRITT, Lanyla Costello 11/23/2018, 6:10 PM

## 2018-11-23 NOTE — Progress Notes (Signed)
PROGRESS NOTE   Tara Nolan  ZOX:[id number]    DOB: September 27, 1919    DOA: 11/22/2018  PCP: Eartha InchBadger, Michael C, MD   I have briefly reviewed patients previous medical records in [facility].  Brief Narrative:  Tara Nolan is a very pleasant 84 year old female, lives alone, ambulates with the help of a walker, son and daughter-in-law who is a Publishing rights managernurse practitioner live close by to assist, PMH of hypothyroid who is noncompliant with Synthroid, CVA, prior hospitalization in October 2018 when she was extensively evaluated for syncope without etiology, was admitted because of a fall and left wrist fracture, presented to [facility] ED on 11/22/2018 due to syncope and fall at home on night prior to admission.  Ongoing frequent episodes of passing out almost daily for the last 1 month as per patient's report today.  Work-up thus far including carotid Dopplers, telemetry monitoring and TTE not revealing for cause.  Concern for seizures.  I discussed with [facility] hospital[facility] and based upon recommendations, patient being [facility] when bed available for continuous EEG monitoring.   Assessment & Plan:   Principal Problem:   Syncope and collapse Active Problems:   Fall   Chest discomfort   History of CVA (cerebrovascular accident)   Back pain   Hypothyroid    1. Recurrent syncope: Unclear etiology.  This has happened in the past in October 2018 when she was extensively evaluated while hospitalized.  Since then she apparently did not have it until recently.    Her history is evolving but today she reported that she has been having almost daily episodes of passing out for the last 1 month.  This can happen both in upright position and while laying in bed.  No associated prodrome or seizure-like activity.  She had 2 such witnessed episodes while laying in bed by EDP on 12/28 and this morning by son on 12/29. As per extensive discussion with patient and her family, they wished to  pursue evaluation again including telemetry monitoring, 2D echo, carotid Dopplers, EEG and if needed outpatient heart monitor.  This is more so they can know the cause if possible and treatment if not very aggressive/invasive.  Telemetry shows sinus rhythm without arrhythmias or pauses.  No orthostatic blood pressure changes. Carotid Doppler shows bilateral 1-39% ICA stenosis.  TTE: LVEF 60-65%, moderate LVH and moderate pulmonary hypertension.   Concern for seizure-like activity as cause of syncope.  I discussed in detail with Dr. Amada JupiterKirkpatrick, Neuro Hospitalist who had 2 options, starting Keppra 250 mg twice daily and monitoring and if no further syncopal episodes then outpatient follow-up with neurology versus [facility].  I discussed these options in detail with patient and her son at bedside and they opted [facility]. 2. Fall: Likely related to syncope and unsteady gait.  PT and OT evaluation ongoing. 3. Atypical chest discomfort: Does not appear ischemic in nature.    Troponins minimally elevated and flat trend, TTE with normal EF and no wall motion abnormalities.  Likely non-ACS pattern. 4. Hypothyroid: Patient reports noncompliance to Synthroid because she feels that what causes her to pass out.  Has not taken Synthroid in 2 weeks.  Last TSH in 2018 was 40.  Repeat TSH: 23.  Continue Synthroid. 5. New cough/possible right upper lobe pneumonia: Could be aspiration when she syncopized.  IV ceftriaxone followed by Augmentin at discharge.  No history of dysphagia or aspiration risk otherwise.  Day 2 IV ceftriaxone. 6. Back pain/L1 compression fracture, possibly chronic: PRN Tylenol and K pad.  PT and OT evaluation ongoing.  Patient does not want to try any stronger medications.  No pain reported today. 7. Hypokalemia: Replaced.    Magnesium normal 8. Anemia: No bleeding reported.    Stable.  Follow CBCs periodically. 9. Leukocytosis: May be stress response  or due to possible pneumonia.    Improving. 10. Suspected right upper lobe pulmonary nodule: Noted.  Given advanced age, no further work-up needed. 11. Thrombocytopenia: Mild.  Follow CBC. 12. Hypertension: PRN IV hydralazine for now.  Not on meds PTA.    DVT prophylaxis: SCDs Code Status: DNR Family Communication: Discussed in detail with patient's son at bedside, updated care and answered questions. Disposition: Patient initially admitted to [facility] for observation and evaluation.  However patient has ongoing syncopal episodes that require further evaluation including continuous EEG monitoring and hence will be transferred for same to [facility] including neurology input.  Thereby she will need to stay additional night in the hospital and hence will transition to inpatient status.   Consultants:  Neuro hospitalist- to see at [facility].  Procedures:  TTE Carotid Doppler  Antimicrobials:  IV Rocephin 12/28 >   Subjective: Patient interviewed and examined this morning along with RN and patient son at bedside.  No pain reported.  Son reports that patient had brief episodes of passing out again while she was in bed after she completed carotid Doppler study.  Patient reports that prior to the episode she feels "funny in the head", even though passed out is able to hear people around but just unable to respond.  No other seizure-like activity or incontinence.  ROS: As above, otherwise negative.  Objective:  Vitals:   11/22/18 1430 11/22/18 2145 11/23/18 0634 11/23/18 0913  BP: (!) 142/67 (!) 113/59  (!) 177/87  Pulse: 82 84  82  Resp: 17 16    Temp:  98.8 F (37.1 C) 97.8 F (36.6 C)   TempSrc:  Oral Oral   SpO2: 92% 98% 98% 99%  Weight:        Examination:  General exam: Pleasant elderly female, small built and frail lying comfortably propped up in bed.  Oral mucosa moist. Respiratory system: Clear to auscultation. Respiratory effort normal. Cardiovascular  system: S1 & S2 heard, RRR. No JVD, murmurs, rubs, gallops or clicks. No pedal edema.  Telemetry personally reviewed: Sinus rhythm. Gastrointestinal system: Abdomen is nondistended, soft and nontender. No organomegaly or masses felt. Normal bowel sounds heard. Central nervous system: Alert and oriented. No focal neurological deficits. Extremities: Symmetric 5 x 5 power. Skin: No rashes, lesions or ulcers Psychiatry: Judgement and insight appear slightly impaired. Mood & affect appropriate.     Data Reviewed: I have personally reviewed following labs and imaging studies  CBC: Recent Labs  Lab 11/22/18 0925 11/23/18 0550  WBC 16.0* 13.2*  NEUTROABS 14.5*  --   HGB 11.0* 10.5*  HCT 34.1* 32.9*  MCV 99.1 98.8  PLT 147* 133*   Basic Metabolic Panel: Recent Labs  Lab 11/22/18 1036 11/22/18 1826 11/23/18 0550  NA 141  --  140  K 3.1*  --  4.1  CL 107  --  111  CO2 23  --  21*  GLUCOSE 104*  --  78  BUN 13  --  16  CREATININE 0.87  --  0.86  CALCIUM 8.4*  --  8.0*  MG  --  2.0  --  Liver Function Tests: Recent Labs  Lab 11/22/18 1036  AST 35  ALT 19  ALKPHOS 44  BILITOT 1.6*  PROT 7.2  ALBUMIN 4.3   Cardiac Enzymes: Recent Labs  Lab 11/22/18 1036 11/22/18 1826 11/22/18 2334 11/23/18 0550  CKTOTAL 229  --   --   --   TROPONINI  --  0.05* 0.04* 0.04*     Recent Results (from the past 240 hour(s))  Urine culture     Status: Abnormal (Preliminary result)   Collection Time: 11/22/18  9:25 AM  Result Value Ref Range Status   Specimen Description URINE, RANDOM  Final   Special Requests   Final    NONE Performed at [facility], [address], [place], [place] [address]    Culture 10,000 COLONIES/mL GRAM NEGATIVE RODS (A)  Final   Report Status PENDING  Incomplete         Radiology Studies: Dg Chest 2 View  Result Date: 11/22/2018 CLINICAL DATA:  Pt arrives via [facility] from home. Pt reports she had a fall last night about 2000. Pt  was unable to get up out of the floor until EMS arrived this morning when son found the pt lying on the floor. Pt complained of lower back and right hip pain. EXAM: CHEST - 2 VIEW COMPARISON:  09/07/2017 FINDINGS: The heart is mildly enlarged and stable in configuration. The aorta is tortuous and partially calcified. There is prominence of interstitial markings, likely chronic. Focal asymmetric opacities are identified in the RIGHT lung apex, raising question of infiltrate or underlying nodule. Recommend follow-up chest x-ray and possible CT if there is failure of these areas to resolve. Scoliosis, convex LEFT. There are numerous thoracic and UPPER lumbar which compression fractures. The age of these fractures is indeterminate. There is no pneumothorax. No acute displaced rib fractures. IMPRESSION: 1. Cardiomegaly. 2. Stable prominence of interstitial markings. 3. Focal opacity in the RIGHT lung apex possibly representing infiltrate versus pulmonary nodules. Follow-up is recommended. Recommend follow-up PA and LATERAL chest to determine if these are persistent. Consider CT of the chest if findings persist. Electronically Signed   By: Norva Pavlov M.D.   On: 11/22/2018 11:03   Ct Head Wo Contrast  Result Date: 11/22/2018 CLINICAL DATA:  Patient fell last evening at 2000 hours. No loss of consciousness. Headache. EXAM: CT HEAD WITHOUT CONTRAST CT CERVICAL SPINE WITHOUT CONTRAST TECHNIQUE: Multidetector CT imaging of the head and cervical spine was performed following the standard protocol without intravenous contrast. Multiplanar CT image reconstructions of the cervical spine were also generated. COMPARISON:  09/12/2017 MRI, head CT 12/11/2003 FINDINGS: CT HEAD FINDINGS Brain: Age related involutional changes the brain with sulcal prominence consistent with superficial atrophy. No hydrocephalus. Mild-to-moderate small vessel ischemic disease of periventricular white matter appears chronic. No large vascular  territory infarct, hemorrhage, midline shift or edema. Midline fourth ventricle and basal cisterns without effacement. Brainstem and cerebellum appear intact. No intra-axial mass nor extra-axial fluid collections. Bilateral basal ganglial idiopathic calcifications are noted. Vascular: No hyperdense vessel sign or unexpected calcifications. Moderate atherosclerosis of the carotid siphons. Skull: Intact Sinuses/Orbits: Mild membrane thickening of the ethmoid sinus. The remainder of the paranasal sinuses are unremarkable. No acute ocular abnormality. Other: Clear mastoids. CT CERVICAL SPINE FINDINGS Alignment: Mild grade 1 anterolisthesis of C3 on C4 and C7 on T1 likely degenerative in etiology. Intact atlantodental interval and craniocervical relationship. Skull base and vertebrae: Intact skull base. No acute cervical spine fracture or jumped facets.No suspicious osseous  lesions. Soft tissues and spinal canal: No prevertebral fluid or swelling. No visible canal hematoma. Disc levels: Moderate disc flattening at C2-3 with marked disc flattening from C4 through C7. Small posterior marginal osteophytes are noted from C4 through C7. Bilateral mild facet arthropathy is noted. Mild right-sided C3-4 and bilateral C4-5, C5-6 and C6-7 uncovertebral joint osteoarthritis with uncinate spurring. No significant foraminal encroachment. No significant central canal stenosis. Upper chest: Apical pleuroparenchymal scarring. Subpleural 6 mm nodular opacity at the right lung apex may be postinfectious or inflammatory related scarring. Nodule not entirely excluded. Aortic atherosclerosis at the arch. Other: Extracranial carotid arteriosclerosis. IMPRESSION: 1. Atrophy with chronic appearing small vessel ischemic disease. No acute intracranial abnormality. 2. Cervical spondylosis without acute posttraumatic cervical spine fracture or subluxation. 3. Subpleural 6 mm nodular opacity at the right lung apex may be postinfectious or  inflammatory in etiology. Pulmonary nodule not entirely excluded. Non-contrast chest CT at 6-12 months is recommended. If the nodule is stable at time of repeat CT, then future CT at 18-24 months (from today's scan) is considered optional for low-risk patients, but is recommended for high-risk patients. This recommendation follows the consensus statement: Guidelines for Management of Incidental Pulmonary Nodules Detected on CT Images: From the Fleischner Society 2017; Radiology 2017; 284:228-243. Electronically Signed   By: Tollie Eth M.D.   On: 11/22/2018 14:07   Ct Cervical Spine Wo Contrast  Result Date: 11/22/2018 CLINICAL DATA:  Patient fell last evening at 2000 hours. No loss of consciousness. Headache. EXAM: CT HEAD WITHOUT CONTRAST CT CERVICAL SPINE WITHOUT CONTRAST TECHNIQUE: Multidetector CT imaging of the head and cervical spine was performed following the standard protocol without intravenous contrast. Multiplanar CT image reconstructions of the cervical spine were also generated. COMPARISON:  09/12/2017 MRI, head CT 12/11/2003 FINDINGS: CT HEAD FINDINGS Brain: Age related involutional changes the brain with sulcal prominence consistent with superficial atrophy. No hydrocephalus. Mild-to-moderate small vessel ischemic disease of periventricular white matter appears chronic. No large vascular territory infarct, hemorrhage, midline shift or edema. Midline fourth ventricle and basal cisterns without effacement. Brainstem and cerebellum appear intact. No intra-axial mass nor extra-axial fluid collections. Bilateral basal ganglial idiopathic calcifications are noted. Vascular: No hyperdense vessel sign or unexpected calcifications. Moderate atherosclerosis of the carotid siphons. Skull: Intact Sinuses/Orbits: Mild membrane thickening of the ethmoid sinus. The remainder of the paranasal sinuses are unremarkable. No acute ocular abnormality. Other: Clear mastoids. CT CERVICAL SPINE FINDINGS Alignment:  Mild grade 1 anterolisthesis of C3 on C4 and C7 on T1 likely degenerative in etiology. Intact atlantodental interval and craniocervical relationship. Skull base and vertebrae: Intact skull base. No acute cervical spine fracture or jumped facets.No suspicious osseous lesions. Soft tissues and spinal canal: No prevertebral fluid or swelling. No visible canal hematoma. Disc levels: Moderate disc flattening at C2-3 with marked disc flattening from C4 through C7. Small posterior marginal osteophytes are noted from C4 through C7. Bilateral mild facet arthropathy is noted. Mild right-sided C3-4 and bilateral C4-5, C5-6 and C6-7 uncovertebral joint osteoarthritis with uncinate spurring. No significant foraminal encroachment. No significant central canal stenosis. Upper chest: Apical pleuroparenchymal scarring. Subpleural 6 mm nodular opacity at the right lung apex may be postinfectious or inflammatory related scarring. Nodule not entirely excluded. Aortic atherosclerosis at the arch. Other: Extracranial carotid arteriosclerosis. IMPRESSION: 1. Atrophy with chronic appearing small vessel ischemic disease. No acute intracranial abnormality. 2. Cervical spondylosis without acute posttraumatic cervical spine fracture or subluxation. 3. Subpleural 6 mm nodular opacity at the right lung apex may be  postinfectious or inflammatory in etiology. Pulmonary nodule not entirely excluded. Non-contrast chest CT at 6-12 months is recommended. If the nodule is stable at time of repeat CT, then future CT at 18-24 months (from today's scan) is considered optional for low-risk patients, but is recommended for high-risk patients. This recommendation follows the consensus statement: Guidelines for Management of Incidental Pulmonary Nodules Detected on CT Images: From the Fleischner Society 2017; Radiology 2017; 284:228-243. Electronically Signed   By: Tollie Eth M.D.   On: 11/22/2018 14:07   Ct L-spine No Charge  Result Date:  11/22/2018 CLINICAL DATA:  Low back pain after a fall last night. Initial encounter. EXAM: CT LUMBAR SPINE WITHOUT CONTRAST TECHNIQUE: Multidetector CT imaging of the lumbar spine was performed without intravenous contrast administration. Multiplanar CT image reconstructions were also generated. COMPARISON:  None. FINDINGS: Segmentation: 5 lumbar type vertebrae. Alignment: Thoracolumbar levoscoliosis. Minimal retrolisthesis of T11 on T12 and L1 on L2. 6 mm anterolisthesis of L4 on L5 and 9 mm anterolisthesis of L5 on S1 with a unilateral right-sided L5 pars defect. Vertebrae: L1 inferior endplate compression fracture with superimposed central Schmorl's node resulting in severe vertebral body height loss centrally, favored to be chronic. No suspicious osseous lesion. Diffuse osteopenia. Paraspinal and other soft tissues: Bilateral posterior paraspinal muscle fatty atrophy. Intra-abdominal contents reported separately. Disc levels: Advanced disc space narrowing at T11-12 and from L2-3 to L5-S1 with degenerative endplate sclerosis, spurring, and vacuum disc at most levels. L1-2: Disc bulging, spurring, and mild L1 inferior endplate retropulsion result in moderate right neural foraminal stenosis without spinal stenosis. L2-3: Disc bulging, endplate spurring, and mild facet and ligamentum flavum hypertrophy result in mild spinal stenosis and mild bilateral neural foraminal stenosis. L3-4: Disc bulging, endplate spurring, and mild facet and ligamentum flavum hypertrophy result in mild spinal stenosis and mild right and moderate left neural foraminal stenosis. L4-5: Anterolisthesis with bulging uncovered disc and facet hypertrophy result in moderate spinal stenosis and moderate right and severe left neural foraminal stenosis. L5-S1: Anterolisthesis with bulging uncovered disc results in moderate right greater than left neural foraminal stenosis and mild spinal stenosis. IMPRESSION: 1. L1 inferior endplate compression  fracture in Schmorl's node deformity, favored to be chronic. 2. Grade 1 anterolisthesis at L4-5 with moderate spinal and moderate to severe neural foraminal stenosis. 3. 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 Electronically Signed   By: Sebastian Ache M.D.   On: 11/22/2018 14:08   Dg Hip Unilat W Or Wo Pelvis 2-3 Views Right  Result Date: 11/22/2018 CLINICAL DATA:  Pt arrives via [facility] from home. Pt reports she had a fall last night about 2000. Pt was unable to get up out of the floor until EMS arrived this morning when son found the pt lying on the floor. Pt complained of lower back and right hip pain. EXAM: DG HIP (WITH OR WITHOUT PELVIS) 2-3V RIGHT COMPARISON:  06/20/2012 FINDINGS: Bones appear radiolucent. There is no acute fracture or subluxation. Mild degenerative changes are seen in the LOWER lumbar spine. There is atherosclerotic calcification of the femoral arteries. IMPRESSION: No evidence for acute abnormality. Electronically Signed   By: Norva Pavlov M.D.   On: 11/22/2018 11:07   Vas US Carotid  Result Date: 11/23/2018 Carotid Arterial Duplex Study Indications:       Syncope. Limitations:       vessels tortuosity Comparison Study:  No available comparison study Performing Technologist: Annamaria Helling  Examination  Guidelines: A complete evaluation includes B-mode imaging, spectral Doppler, color Doppler, and power Doppler as needed of all accessible portions of each vessel. Bilateral testing is considered an integral part of a complete examination. Limited examinations for reoccurring indications may be performed as noted.  Right Carotid Findings: +----------+--------+--------+--------+------------------+--------+           PSV cm/sEDV cm/sStenosisDescribe          Comments +----------+--------+--------+--------+------------------+--------+ CCA Prox  74      10                                tortuous  +----------+--------+--------+--------+------------------+--------+ CCA Distal41      9               focal and calcific         +----------+--------+--------+--------+------------------+--------+ ICA Prox  47      8       1-39%   focal and calcific         +----------+--------+--------+--------+------------------+--------+ ICA Mid   62      15                                         +----------+--------+--------+--------+------------------+--------+ ICA Distal70      17                                         +----------+--------+--------+--------+------------------+--------+ ECA       66                                                 +----------+--------+--------+--------+------------------+--------+ +----------+--------+-------+--------+-------------------+           PSV cm/sEDV cmsDescribeArm Pressure (mmHG) +----------+--------+-------+--------+-------------------+ [id number]                                         +----------+--------+-------+--------+-------------------+ +---------+--------+--+--------+-+---------+ VertebralPSV cm/s32EDV cm/s8Antegrade +---------+--------+--+--------+-+---------+  Left Carotid Findings: +----------+--------+-------+--------+--------------------------------+--------+           PSV cm/sEDV    StenosisDescribe                        Comments                   cm/s                                                    +----------+--------+-------+--------+--------------------------------+--------+ CCA Prox  49      7                                                       +----------+--------+-------+--------+--------------------------------+--------+ CCA Mid  diffuse, hyperechoic and                                                  calcific                                 +----------+--------+-------+--------+--------------------------------+--------+ CCA Distal52       10             diffuse and hypoechoic                   +----------+--------+-------+--------+--------------------------------+--------+ ICA Prox  58      17     1-39%   focal and calcific                       +----------+--------+-------+--------+--------------------------------+--------+ ICA Distal51      9                                                       +----------+--------+-------+--------+--------------------------------+--------+ ECA       56                                                              +----------+--------+-------+--------+--------------------------------+--------+ +----------+--------+--------+--------+-------------------+ SubclavianPSV cm/sEDV cm/sDescribeArm Pressure (mmHG) +----------+--------+--------+--------+-------------------+           52                                          +----------+--------+--------+--------+-------------------+ +---------+--------+--+--------+-+---------+ VertebralPSV cm/s52EDV cm/s9Antegrade +---------+--------+--+--------+-+---------+  Summary: Right Carotid: Velocities in the right ICA are consistent with a 1-39% stenosis. Left Carotid: Velocities in the left ICA are consistent with a 1-39% stenosis. Vertebrals: Bilateral vertebral arteries demonstrate antegrade flow. *See table(s) above for measurements and observations.     Preliminary    Ct Angio Abd/pel W And/or Wo Contrast  Result Date: 11/22/2018 CLINICAL DATA:  fall last night about 2000. Pt reports the fall was mechanical in nature. Pt endorses possible head injury, denies LOC. Denies use blood thinners. Pt was unable to get up out of the floor until EMS arrived this am when son found the pt lying on the floor. Pts only compliant is lumbar pain. Abd mass, pulsatile, AAA suspected. EXAM: CTA ABDOMEN AND PELVIS WITH CONTRAST TECHNIQUE: Multidetector CT imaging of the abdomen and pelvis was performed using the standard protocol during bolus  administration of intravenous contrast. Multiplanar reconstructed images and MIPs were obtained and reviewed to evaluate the vascular anatomy. CONTRAST:  100mL ISOVUE-370 IOPAMIDOL (ISOVUE-370) INJECTION 76% COMPARISON:  06/02/2004 FINDINGS: VASCULAR Aorta: Moderate calcified atheromatous plaque. Small penetrating atheromatous ulcer posteriorly in the infrarenal segment without intramural hematoma. No aneurysm, dissection, or stenosis. Celiac: Patent without evidence of aneurysm, dissection, vasculitis or significant stenosis. SMA: Calcified ostial plaque without significant stenosis, patent distally with classic distal branch anatomy. Renals: Single left, widely  patent. Single right, with partially calcified ostial plaque resulting in short segment stenosis of at least mild severity, patent distally. IMA: Patent without evidence of aneurysm, dissection, vasculitis or significant stenosis. Inflow: Moderate tortuosity. Scattered calcified atheromatous plaque. No aneurysm, dissection, or stenosis. Proximal Outflow: Bilateral common femoral and visualized portions of the superficial and profunda femoral arteries are patent without evidence of aneurysm, dissection, vasculitis or significant stenosis. Veins: No obvious venous abnormality within the limitations of this arterial phase study. Review of the MIP images confirms the above findings. NON-VASCULAR Lower chest: Pectus deformity. No pleural or pericardial effusion. Minimal dependent atelectasis in the visualized lung bases. Hepatobiliary: No focal liver abnormality is seen. No gallstones, gallbladder wall thickening, or biliary dilatation. Pancreas: Unremarkable. No pancreatic ductal dilatation or surrounding inflammatory changes. Spleen: Normal in size without focal abnormality. Adrenals/Urinary Tract: Normal adrenals. 1.7 cm upper pole left renal cyst. 2.8 cm right lower pole renal cyst. No hydronephrosis. Urinary bladder physiologically distended. Stomach/Bowel:  Stomach and small bowel are nondilated. Appendix surgically absent. Colon is nondilated. Scattered sigmoid diverticula without adjacent inflammatory/edematous change or abscess. Lymphatic: No abdominal or pelvic adenopathy localized. Reproductive: Status post hysterectomy. No adnexal masses. Other: No ascites. No free air. Musculoskeletal: L1 compression deformity with 50% loss of height anteriorly , age indeterminate. Mild thoracolumbar scoliosis. Multilevel spondylitic changes. Grade 1 anterolisthesis L4-5 and L5-S1 probably related to bilateral facet DJD. IMPRESSION: VASCULAR 1. No acute findings. 2. Aortoiliac Atherosclerosis (ICD10-170.0) without aneurysm. 3. Right renal artery ostial stenosis of possible hemodynamic significance. NON-VASCULAR 1. L1 compression deformity, age indeterminate. 2. Sigmoid diverticulosis. Electronically Signed   By: Corlis Leak M.D.   On: 11/22/2018 14:04        Scheduled Meds: . levothyroxine  12.5 mcg Oral Q0600  . sodium chloride flush  3 mL Intravenous Q12H   Continuous Infusions: . cefTRIAXone (ROCEPHIN)  IV 1 g (11/22/18 1818)     LOS: 0 days     Marcellus Scott, MD, FACP, [facility]. [facility] Pager [phone]  If 7PM-7AM, please contact night-coverage [email or web address] Password [facility] 11/23/2018, 12:41 PM

## 2018-11-23 NOTE — Progress Notes (Signed)
Per son patient had syncopal episode after vascular tech left room, son says she "went out" for about a minute.  Patient currently alert and talking to nurse.  VSS.  Dr. Hongalgi notWaymon Amatoified, Dr. Waymon AmatoHongalgi came into room to discuss with patient and son.

## 2018-11-23 NOTE — Progress Notes (Signed)
Patient had diarrhea during exam. Waited for her finished. Tara Nolan(RDMS RVT) 11/23/18

## 2018-11-23 NOTE — Progress Notes (Signed)
Carotid duplex exam completed. Please see preliminary notes on CV PROC under chart review. Donalee Gaumond H Lindie Roberson(RDMS RVT) 11/23/18 10:10 AM

## 2018-11-23 NOTE — Progress Notes (Signed)
[facility] arrived and preparing [facility]. Report was called to receiving nurse on 3W, pt going to room 26.

## 2018-11-23 NOTE — Plan of Care (Signed)
Patient without complaint of pain on 7 a to 7 p shift, son states at home patient does c/o severe head and eye pain, patient verbalized none of this today.  Patient is frustrated about being given medicine here, feels Synthroid has caused her BP to be increased.  Given IV Hydralazine x 1 with some improvement in BP.  Awaiting [facility] transport to [facility] bed [id number].

## 2018-11-23 NOTE — Progress Notes (Signed)
PT Note  Patient Details Name: Tara Nolan MRN: [id number] DOB: 09/08/1919        Was able to assess pt this morniAlroy Nolan. Limited mobility due to BPs were high: 183/93 supine, 176/94 sitting, and transferred to the recliner and 189/107. Pt moves fairly well, just reporting that she feels "funny feeling in the head all the time" at times she states it is worse than others. At the moment sitting in th recliner she states she feels a little better. She does report she has lately had headaches/eye aches regularly. Did not walk further with pt (although I feel she could) due to BPs at this time/ Sitting resting in recliner with son present, nurse aware.  Full write up to follow. Nurse and family states they re waiting transfer to [facility].     Tara BileBRITT, Tara Nolan 11/23/2018, 12:19 PM  Tara BileSharron Henri Nolan, PT Acute Rehabilitation Services Pager: [phone] Office: [phone] 11/23/2018

## 2018-11-23 NOTE — Progress Notes (Signed)
Occupational Therapy Evaluation Patient Details Name: Tara Nolan MRN: [id number] DOB: [birth year] Today's Date: 11/23/2018    History of Present Illness 84 year old female, lives alone, ambulates with the help of a walker. presented to [facility] long ED on 11/22/2018 due to syncope and fall at home on night prior to admission. PMH of hypothyroid who is noncompliant with Synthroid, CVA, prior hospitalization in October 2018 when she was extensively evaluated for syncope without etiology, was admitted because of a fall and left wrist fracture. patient being transferred to [facility] when bed available for continuous EEG monitoring   Clinical Impression   PTA, pt living at home alone, states she was independent with bathing and dressing and ambulated with a rollator. Pt states that "this time I fell and my buggy landed on top of me". Son states she was on the floor at least 12 hours before getting help. Pt unable to reason through the need to use her "lifeline" to call for help Session limited by high BP @ 189. Pt unsteady with mobility and requires min A with LB ADL. Pt with apparent cognitive deficits. Son reports pt has become more paranoid and will not allow people to stay in her house to help her. Son states that his mother puts items in front of her bedroom door at night to "keep people out". Pt was talking to this therapist about the ghost that lives in her house. Pt states that she is "ready to be with her family; I've lived a long life but now it's time for me to go". Pt may benefit from a palliative care consult for clear goals of care with pt/family.  If pt progresses with mobility and ability to complete ADL, she would be appropriate to DC home but would need 24/7 assistance. If this is not available, she would benefit from rehab at [facility]. Will follow acutely to facilitate safe DC to next venue of care.     Follow Up Recommendations  Supervision/Assistance - 24 hour;Home health OT(SNF if 24/7 not  available)    Equipment Recommendations  None recommended by OT    Recommendations for Other Services       Precautions / Restrictions Precautions Precautions: Fall Precaution Comments: watch BP      Mobility Bed Mobility Overal bed mobility: Needs Assistance Bed Mobility: Supine to Sit;Sit to Supine     Supine to sit: Supervision Sit to supine: Supervision      Transfers Overall transfer level: Needs assistance   Transfers: Sit to/from Stand Sit to Stand: Min assist         General transfer comment: "I'm just unsteady"    Balance Overall balance assessment: History of Falls;Needs assistance   Sitting balance-Leahy Scale: Good       Standing balance-Leahy Scale: Poor                             ADL either performed or assessed with clinical judgement   ADL Overall ADL's : Needs assistance/impaired     Grooming: Set up;Sitting   Upper Body Bathing: Set up;Sitting   Lower Body Bathing: Minimal assistance;Sit to/from stand   Upper Body Dressing : Set up;Sitting   Lower Body Dressing: Minimal assistance;Sit to/from stand   Toilet Transfer: Minimal assistance;Stand-pivot   Toileting- Clothing Manipulation and Hygiene: Minimal assistance;Sit to/from stand       Functional mobility during ADLs: Minimal assistance General ADL Comments: Son reports pt does not take her BP  medicines; son assists with meals as pt does not eat well; pt sponge baths; pt doe snot like people in her home     Vision         Perception     Praxis      Pertinent Vitals/Pain Pain Assessment: Faces Faces Pain Scale: Hurts a little bit Pain Location: BLE Pain Descriptors / Indicators: Burning Pain Intervention(s): Limited activity within patient's tolerance     Hand Dominance Right   Extremity/Trunk Assessment Upper Extremity Assessment Upper Extremity Assessment: Generalized weakness   Lower Extremity Assessment Lower Extremity Assessment: Defer to  PT evaluation   Cervical / Trunk Assessment Cervical / Trunk Assessment: Kyphotic   Communication Communication Communication: HOH   Cognition Arousal/Alertness: Awake/alert Behavior During Therapy: Restless Overall Cognitive Status: History of cognitive impairments - at baseline                                 General Comments: Pt tangential; apparent STM deficits; deficits with reasoning/insight/judgement and awareness   General Comments  high BP; systolic 189 in standing    Exercises     Shoulder Instructions      Home Living Family/patient expects to be discharged to:: Private residence Living Arrangements: Alone Available Help at Discharge: Family;Friend(s);Available PRN/intermittently Type of Home: House Home Access: Stairs to enter Entergy CorporationEntrance Stairs-Number of Steps: 1 Entrance Stairs-Rails: None Home Layout: One level     Bathroom Shower/Tub: Producer, television/film/videoWalk-in shower   Bathroom Toilet: Handicapped height Bathroom Accessibility: Yes How Accessible: Accessible via walker Home Equipment: Walker - 4 wheels;Bedside commode          Prior Functioning/Environment Level of Independence: Independent with assistive device(s)        Comments: using rollator        OT Problem List: Decreased strength;Decreased activity tolerance;Impaired balance (sitting and/or standing);Decreased cognition;Decreased safety awareness;Decreased knowledge of use of DME or AE;Decreased knowledge of precautions;Cardiopulmonary status limiting activity;Pain      OT Treatment/Interventions: Self-care/ADL training;Energy conservation;DME and/or AE instruction;Therapeutic activities;Cognitive remediation/compensation;Patient/family education;Balance training    OT Goals(Current goals can be found in the care plan section) Acute Rehab OT Goals Patient Stated Goal: "to go be with my family" referring to family who has passed away OT Goal Formulation: With patient/family Time For Goal  Achievement: 12/08/17 Potential to Achieve Goals: Good  OT Frequency: Min 2X/week   Barriers to D/C:            Co-evaluation              AM-PAC OT "6 Clicks" Daily Activity     Outcome Measure Help from another person eating meals?: None Help from another person taking care of personal grooming?: A Little Help from another person toileting, which includes using toliet, bedpan, or urinal?: A Little Help from another person bathing (including washing, rinsing, drying)?: A Little Help from another person to put on and taking off regular upper body clothing?: A Little Help from another person to put on and taking off regular lower body clothing?: A Little 6 Click Score: 19   End of Session Equipment Utilized During Treatment: Gait belt Nurse Communication: Mobility status  Activity Tolerance: Treatment limited secondary to medical complications (Comment)(high BP) Patient left: in bed;with call bell/phone within reach;with bed alarm set;with family/visitor present  OT Visit Diagnosis: Unsteadiness on feet (R26.81);History of falling (Z91.81);Muscle weakness (generalized) (M62.81);Other symptoms and signs involving cognitive function;Pain Pain - part of body: (BLE)  Time: [id number] OT Time Calculation (min): 23 min Charges:  OT General Charges $OT Visit: 1 Visit OT Evaluation $OT Eval Moderate Complexity: 1 Mod OT Treatments $Self Care/Home Management : 8-22 mins  Luisa DagoHilary Jakita Dutkiewicz, OT/L   Acute OT Clinical Specialist Acute Rehabilitation Services Pager [phone] Office [phone]   [facility],HILLARY 11/23/2018, 2:13 PM

## 2018-11-24 ENCOUNTER — Inpatient Hospital Stay (HOSPITAL_COMMUNITY): Payer: Medicare Other

## 2018-11-24 DIAGNOSIS — R569 Unspecified convulsions: Secondary | ICD-10-CM

## 2018-11-24 DIAGNOSIS — G47419 Narcolepsy without cataplexy: Secondary | ICD-10-CM

## 2018-11-24 DIAGNOSIS — R55 Syncope and collapse: Secondary | ICD-10-CM

## 2018-11-24 LAB — URINE CULTURE

## 2018-11-24 MED ORDER — LEVOTHYROXINE SODIUM 25 MCG PO TABS
25.0000 ug | ORAL_TABLET | Freq: Every day | ORAL | Status: DC
  Administered 2018-11-25 – 2018-11-27 (×3): 25 ug via ORAL
  Filled 2018-11-24 (×3): qty 1

## 2018-11-24 MED ORDER — HYDRALAZINE HCL 20 MG/ML IJ SOLN: 10.0000 mg | Freq: Three times a day (TID) | INTRAMUSCULAR | Status: DC | PRN

## 2018-11-24 MED ORDER — CEPHALEXIN 250 MG PO CAPS: 250.0000 mg | ORAL_CAPSULE | Freq: Three times a day (TID) | ORAL | Status: DC

## 2018-11-24 MED ORDER — CEPHALEXIN 250 MG PO CAPS
250.0000 mg | ORAL_CAPSULE | Freq: Three times a day (TID) | ORAL | Status: AC
  Administered 2018-11-25 – 2018-11-26 (×6): 250 mg via ORAL
  Filled 2018-11-24 (×6): qty 1

## 2018-11-24 NOTE — Progress Notes (Signed)
Patient Demographics:    Tara Nolan, is a 84 y.o. female, DOB - [birth year], ZOX:[id number]  Admit date - 11/22/2018   Admitting Physician Elease Etienne, MD  Outpatient Primary MD for the patient is [name], MD  LOS - 1   Chief Complaint  Patient presents with  . 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,  No chest pain,  Son at bedside, no further episodes  Assessment  & Plan :    Principal Problem:   Syncope and collapse Active Problems:   Fall   Chest discomfort   History of CVA (cerebrovascular accident)   Back pain   Hypothyroid  Brief Summary  84 year old female, lives alone, ambulates with the help of a walker, son and daughter-in-law who is a Publishing rights manager live close by to assist, PMH of hypothyroidism who is noncompliant with Synthroid, CVA, prior hospitalization in October 2018 when she was extensively evaluated for syncope without etiology, was admitted because of a fall and left wrist fracture, admitted to  [facility]  on 11/22/2018 due to syncope and fall at home on night prior to admission.  Ongoing frequent episodes of passing out almost daily for the last 1 month as per patient's report today.  Work-up thus far including carotid Dopplers, telemetry monitoring and TTE not revealing for cause.  Concern for possible seizures.   patient being transferred to [facility]  On 11/14/18 for  continuous EEG monitoring and further neurology evaluation.  Plan:-  1) recurrent fainting spells/possible syncope Vs possible seizures--- syncope work-up negative so far, see brief summary above, awaiting EEG report and further neurology input  2)Back pain--patient with L1 compression fracture--- ??  Running, get PT OT eval, continue PRN Tylenol,   3) E. coli UTI----currently being treated with IV Rocephin, may de-escalate to p.o. Keflex per sensitivity  report  4)Hypothyroidism--- TSH is 23, patient is noncompliant with levothyroxine, continue levothyroxine, compliance advised  5) atypical chest pain--- ACS unlikely, troponins are flat, EKG without acute ST changes  6) possible pneumonia---- ??? CAP,  antibiotics as above #3, no respiratory distress at this time, afebrile, no hypoxia  7)HTN--- BP is improved,   may use IV Hydralazine 10 mg  Every 4 hours Prn for systolic blood pressure over 160 mmhg  8) anemia and thrombocytopenia----hemoglobin is stable above 10, no evidence of ongoing bleeding, platelets 133, monitor closely,  Disposition/Need for in-Hospital Stay- patient unable to be discharged at this time due to need to rule out seizures as etiology for recurrent passing out episodes  Code Status : DNR  Family Communication:   Son at bedside   Disposition Plan  : TBD  Consults  :  Neurology  DVT Prophylaxis  :   SCDs  Lab Results  Component Value Date   PLT 133 (L) 11/23/2018    Inpatient Medications  Scheduled Meds: . [START ON 11/25/2018] levothyroxine  25 mcg Oral Q0600  . sodium chloride flush  3 mL Intravenous Q12H   Continuous Infusions: . cefTRIAXone (ROCEPHIN)  IV 1 g (11/23/18 1754)   PRN Meds:.acetaminophen (TYLENOL) oral liquid 160 mg/5 mL, albuterol, docusate sodium, hydrALAZINE    Anti-infectives (From admission, onward)  Start     Dose/Rate Route Frequency Ordered Stop   11/22/18 1900  cefTRIAXone (ROCEPHIN) 1 g in sodium chloride 0.9 % 100 mL IVPB     1 g 200 mL/hr over 30 Minutes Intravenous Every 24 hours 11/22/18 1748          Objective:   Vitals:   11/24/18 0000 11/24/18 0305 11/24/18 0747 11/24/18 1128  BP: (!) 182/90 122/66 117/83 130/76  Pulse: 84 90 79 86  Resp: 18 18 18 20   Temp: 98.5 F (36.9 C) 98.2 F (36.8 C) 98.2 F (36.8 C) 98.6 F (37 C)  TempSrc: Oral Oral Oral Oral  SpO2: 96% 95% 94% 94%  Weight: 44 kg     Height: 5' (1.524 m)       Wt Readings from Last 3  Encounters:  11/24/18 44 kg  09/11/17 44.5 kg  06/20/12 52.2 kg     Intake/Output Summary (Last 24 hours) at 11/24/2018 1610 Last data filed at 11/23/2018 1701 Gross per 24 hour  Intake -  Output 600 ml  Net -600 ml     Physical Exam Patient is examined daily including today on 11/24/18, exams remain the same as of yesterday except that has changed   Gen:- Awake Alert,  In no apparent distress  HEENT:- [place].AT, No sclera icterus Neck-Supple Neck,No JVD,.  Lungs-  CTAB , fair symmetrical air movement CV- S1, S2 normal, regular  Abd-  +ve B.Sounds, Abd Soft, No tenderness, no CVA area tenderness Extremity/Skin:- No  edema, pedal pulses present  Psych-affect is appropriate, oriented x3 Neuro-no new focal deficits, no tremors   Data Review:   Micro Results Recent Results (from the past 240 hour(s))  Urine culture     Status: Abnormal   Collection Time: 11/22/18  9:25 AM  Result Value Ref Range Status   Specimen Description   Final    URINE, RANDOM Performed at [facility], [address], [place], [place] [address]    Special Requests   Final    NONE Performed at [facility], [address], [place], [place] [address]    Culture 10,000 COLONIES/mL ESCHERICHIA COLI (A)  Final   Report Status 11/24/2018 FINAL  Final   Organism ID, Bacteria ESCHERICHIA COLI (A)  Final      Susceptibility   Escherichia coli - MIC*    AMPICILLIN >=32 RESISTANT Resistant     CEFAZOLIN <=4 SENSITIVE Sensitive     CEFTRIAXONE <=1 SENSITIVE Sensitive     CIPROFLOXACIN <=0.25 SENSITIVE Sensitive     GENTAMICIN <=1 SENSITIVE Sensitive     IMIPENEM <=0.25 SENSITIVE Sensitive     NITROFURANTOIN <=16 SENSITIVE Sensitive     TRIMETH/SULFA <=20 SENSITIVE Sensitive     AMPICILLIN/SULBACTAM >=32 RESISTANT Resistant     PIP/TAZO <=4 SENSITIVE Sensitive     Extended ESBL NEGATIVE Sensitive     * 10,000 COLONIES/mL ESCHERICHIA COLI    Radiology Reports Dg  Chest 2 View  Result Date: 11/22/2018 CLINICAL DATA:  Pt arrives via [facility] from home. Pt reports she had a fall last night about 2000. Pt was unable to get up out of the floor until EMS arrived this morning when son found the pt lying on the floor. Pt complained of lower back and right hip pain. EXAM: CHEST - 2 VIEW COMPARISON:  09/07/2017 FINDINGS: The heart is mildly enlarged and stable in configuration. The aorta is tortuous and partially calcified. There is prominence of interstitial markings, likely chronic.  Focal asymmetric opacities are identified in the RIGHT lung apex, raising question of infiltrate or underlying nodule. Recommend follow-up chest x-ray and possible CT if there is failure of these areas to resolve. Scoliosis, convex LEFT. There are numerous thoracic and UPPER lumbar which compression fractures. The age of these fractures is indeterminate. There is no pneumothorax. No acute displaced rib fractures. IMPRESSION: 1. Cardiomegaly. 2. Stable prominence of interstitial markings. 3. Focal opacity in the RIGHT lung apex possibly representing infiltrate versus pulmonary nodules. Follow-up is recommended. Recommend follow-up PA and LATERAL chest to determine if these are persistent. Consider CT of the chest if findings persist. Electronically Signed   By: Norva Pavlov M.D.   On: 11/22/2018 11:03   Ct Head Wo Contrast  Result Date: 11/22/2018 CLINICAL DATA:  Patient fell last evening at 2000 hours. No loss of consciousness. Headache. EXAM: CT HEAD WITHOUT CONTRAST CT CERVICAL SPINE WITHOUT CONTRAST TECHNIQUE: Multidetector CT imaging of the head and cervical spine was performed following the standard protocol without intravenous contrast. Multiplanar CT image reconstructions of the cervical spine were also generated. COMPARISON:  09/12/2017 MRI, head CT 12/11/2003 FINDINGS: CT HEAD FINDINGS Brain: Age related involutional changes the brain with sulcal prominence consistent with superficial  atrophy. No hydrocephalus. Mild-to-moderate small vessel ischemic disease of periventricular white matter appears chronic. No large vascular territory infarct, hemorrhage, midline shift or edema. Midline fourth ventricle and basal cisterns without effacement. Brainstem and cerebellum appear intact. No intra-axial mass nor extra-axial fluid collections. Bilateral basal ganglial idiopathic calcifications are noted. Vascular: No hyperdense vessel sign or unexpected calcifications. Moderate atherosclerosis of the carotid siphons. Skull: Intact Sinuses/Orbits: Mild membrane thickening of the ethmoid sinus. The remainder of the paranasal sinuses are unremarkable. No acute ocular abnormality. Other: Clear mastoids. CT CERVICAL SPINE FINDINGS Alignment: Mild grade 1 anterolisthesis of C3 on C4 and C7 on T1 likely degenerative in etiology. Intact atlantodental interval and craniocervical relationship. Skull base and vertebrae: Intact skull base. No acute cervical spine fracture or jumped facets.No suspicious osseous lesions. Soft tissues and spinal canal: No prevertebral fluid or swelling. No visible canal hematoma. Disc levels: Moderate disc flattening at C2-3 with marked disc flattening from C4 through C7. Small posterior marginal osteophytes are noted from C4 through C7. Bilateral mild facet arthropathy is noted. Mild right-sided C3-4 and bilateral C4-5, C5-6 and C6-7 uncovertebral joint osteoarthritis with uncinate spurring. No significant foraminal encroachment. No significant central canal stenosis. Upper chest: Apical pleuroparenchymal scarring. Subpleural 6 mm nodular opacity at the right lung apex may be postinfectious or inflammatory related scarring. Nodule not entirely excluded. Aortic atherosclerosis at the arch. Other: Extracranial carotid arteriosclerosis. IMPRESSION: 1. Atrophy with chronic appearing small vessel ischemic disease. No acute intracranial abnormality. 2. Cervical spondylosis without acute  posttraumatic cervical spine fracture or subluxation. 3. Subpleural 6 mm nodular opacity at the right lung apex may be postinfectious or inflammatory in etiology. Pulmonary nodule not entirely excluded. Non-contrast chest CT at 6-12 months is recommended. If the nodule is stable at time of repeat CT, then future CT at 18-24 months (from today's scan) is considered optional for low-risk patients, but is recommended for high-risk patients. This recommendation follows the consensus statement: Guidelines for Management of Incidental Pulmonary Nodules Detected on CT Images: From the Fleischner Society 2017; Radiology 2017; 284:228-243. Electronically Signed   By: Tollie Eth M.D.   On: 11/22/2018 14:07   Ct Cervical Spine Wo Contrast  Result Date: 11/22/2018 CLINICAL DATA:  Patient fell last evening at 2000 hours. No  loss of consciousness. Headache. EXAM: CT HEAD WITHOUT CONTRAST CT CERVICAL SPINE WITHOUT CONTRAST TECHNIQUE: Multidetector CT imaging of the head and cervical spine was performed following the standard protocol without intravenous contrast. Multiplanar CT image reconstructions of the cervical spine were also generated. COMPARISON:  09/12/2017 MRI, head CT 12/11/2003 FINDINGS: CT HEAD FINDINGS Brain: Age related involutional changes the brain with sulcal prominence consistent with superficial atrophy. No hydrocephalus. Mild-to-moderate small vessel ischemic disease of periventricular white matter appears chronic. No large vascular territory infarct, hemorrhage, midline shift or edema. Midline fourth ventricle and basal cisterns without effacement. Brainstem and cerebellum appear intact. No intra-axial mass nor extra-axial fluid collections. Bilateral basal ganglial idiopathic calcifications are noted. Vascular: No hyperdense vessel sign or unexpected calcifications. Moderate atherosclerosis of the carotid siphons. Skull: Intact Sinuses/Orbits: Mild membrane thickening of the ethmoid sinus. The remainder  of the paranasal sinuses are unremarkable. No acute ocular abnormality. Other: Clear mastoids. CT CERVICAL SPINE FINDINGS Alignment: Mild grade 1 anterolisthesis of C3 on C4 and C7 on T1 likely degenerative in etiology. Intact atlantodental interval and craniocervical relationship. Skull base and vertebrae: Intact skull base. No acute cervical spine fracture or jumped facets.No suspicious osseous lesions. Soft tissues and spinal canal: No prevertebral fluid or swelling. No visible canal hematoma. Disc levels: Moderate disc flattening at C2-3 with marked disc flattening from C4 through C7. Small posterior marginal osteophytes are noted from C4 through C7. Bilateral mild facet arthropathy is noted. Mild right-sided C3-4 and bilateral C4-5, C5-6 and C6-7 uncovertebral joint osteoarthritis with uncinate spurring. No significant foraminal encroachment. No significant central canal stenosis. Upper chest: Apical pleuroparenchymal scarring. Subpleural 6 mm nodular opacity at the right lung apex may be postinfectious or inflammatory related scarring. Nodule not entirely excluded. Aortic atherosclerosis at the arch. Other: Extracranial carotid arteriosclerosis. IMPRESSION: 1. Atrophy with chronic appearing small vessel ischemic disease. No acute intracranial abnormality. 2. Cervical spondylosis without acute posttraumatic cervical spine fracture or subluxation. 3. Subpleural 6 mm nodular opacity at the right lung apex may be postinfectious or inflammatory in etiology. Pulmonary nodule not entirely excluded. Non-contrast chest CT at 6-12 months is recommended. If the nodule is stable at time of repeat CT, then future CT at 18-24 months (from today's scan) is considered optional for low-risk patients, but is recommended for high-risk patients. This recommendation follows the consensus statement: Guidelines for Management of Incidental Pulmonary Nodules Detected on CT Images: From the Fleischner Society 2017; Radiology 2017;  284:228-243. Electronically Signed   By: Tollie Ethavid  Kwon M.D.   On: 11/22/2018 14:07   Ct L-spine No Charge  Result Date: 11/22/2018 CLINICAL DATA:  Low back pain after a fall last night. Initial encounter. EXAM: CT LUMBAR SPINE WITHOUT CONTRAST TECHNIQUE: Multidetector CT imaging of the lumbar spine was performed without intravenous contrast administration. Multiplanar CT image reconstructions were also generated. COMPARISON:  None. FINDINGS: Segmentation: 5 lumbar type vertebrae. Alignment: Thoracolumbar levoscoliosis. Minimal retrolisthesis of T11 on T12 and L1 on L2. 6 mm anterolisthesis of L4 on L5 and 9 mm anterolisthesis of L5 on S1 with a unilateral right-sided L5 pars defect. Vertebrae: L1 inferior endplate compression fracture with superimposed central Schmorl's node resulting in severe vertebral body height loss centrally, favored to be chronic. No suspicious osseous lesion. Diffuse osteopenia. Paraspinal and other soft tissues: Bilateral posterior paraspinal muscle fatty atrophy. Intra-abdominal contents reported separately. Disc levels: Advanced disc space narrowing at T11-12 and from L2-3 to L5-S1 with degenerative endplate sclerosis, spurring, and vacuum disc at most levels. L1-2: Disc bulging,  spurring, and mild L1 inferior endplate retropulsion result in moderate right neural foraminal stenosis without spinal stenosis. L2-3: Disc bulging, endplate spurring, and mild facet and ligamentum flavum hypertrophy result in mild spinal stenosis and mild bilateral neural foraminal stenosis. L3-4: Disc bulging, endplate spurring, and mild facet and ligamentum flavum hypertrophy result in mild spinal stenosis and mild right and moderate left neural foraminal stenosis. L4-5: Anterolisthesis with bulging uncovered disc and facet hypertrophy result in moderate spinal stenosis and moderate right and severe left neural foraminal stenosis. L5-S1: Anterolisthesis with bulging uncovered disc results in moderate right  greater than left neural foraminal stenosis and mild spinal stenosis. IMPRESSION: 1. L1 inferior endplate compression fracture in Schmorl's node deformity, favored to be chronic. 2. Grade 1 anterolisthesis at L4-5 with moderate spinal and moderate to severe neural foraminal stenosis. 3. 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 Electronically Signed   By: Sebastian AcheAllen  Grady M.D.   On: 11/22/2018 14:08   Dg Hip Unilat W Or Wo Pelvis 2-3 Views Right  Result Date: 11/22/2018 CLINICAL DATA:  Pt arrives via [facility] from home. Pt reports she had a fall last night about 2000. Pt was unable to get up out of the floor until EMS arrived this morning when son found the pt lying on the floor. Pt complained of lower back and right hip pain. EXAM: DG HIP (WITH OR WITHOUT PELVIS) 2-3V RIGHT COMPARISON:  06/20/2012 FINDINGS: Bones appear radiolucent. There is no acute fracture or subluxation. Mild degenerative changes are seen in the LOWER lumbar spine. There is atherosclerotic calcification of the femoral arteries. IMPRESSION: No evidence for acute abnormality. Electronically Signed   By: Norva PavlovElizabeth  Brown M.D.   On: 11/22/2018 11:07   Vas [place] Carotid  Result Date: 11/23/2018 Carotid Arterial Duplex Study Indications:       Syncope. Limitations:       vessels tortuosity Comparison Study:  No available comparison study Performing Technologist: Annamaria HellingOLE, HONGYING  Examination Guidelines: A complete evaluation includes B-mode imaging, spectral Doppler, color Doppler, and power Doppler as needed of all accessible portions of each vessel. Bilateral testing is considered an integral part of a complete examination. Limited examinations for reoccurring indications may be performed as noted.  Right Carotid Findings: +----------+--------+--------+--------+------------------+--------+           PSV cm/sEDV cm/sStenosisDescribe          Comments  +----------+--------+--------+--------+------------------+--------+ CCA Prox  74      10                                tortuous +----------+--------+--------+--------+------------------+--------+ CCA Distal41      9               focal and calcific         +----------+--------+--------+--------+------------------+--------+ ICA Prox  47      8       1-39%   focal and calcific         +----------+--------+--------+--------+------------------+--------+ ICA Mid   62      15                                         +----------+--------+--------+--------+------------------+--------+ ICA Distal70      17                                         +----------+--------+--------+--------+------------------+--------+  ECA       66                                                 +----------+--------+--------+--------+------------------+--------+ +----------+--------+-------+--------+-------------------+           PSV cm/sEDV cmsDescribeArm Pressure (mmHG) +----------+--------+-------+--------+-------------------+ [id number]                                         +----------+--------+-------+--------+-------------------+ +---------+--------+--+--------+-+---------+ VertebralPSV cm/s32EDV cm/s8Antegrade +---------+--------+--+--------+-+---------+  Left Carotid Findings: +----------+--------+-------+--------+--------------------------------+--------+           PSV cm/sEDV    StenosisDescribe                        Comments                   cm/s                                                    +----------+--------+-------+--------+--------------------------------+--------+ CCA Prox  49      7                                                       +----------+--------+-------+--------+--------------------------------+--------+ CCA Mid                          diffuse, hyperechoic and                                                   calcific                                 +----------+--------+-------+--------+--------------------------------+--------+ CCA Distal52      10             diffuse and hypoechoic                   +----------+--------+-------+--------+--------------------------------+--------+ ICA Prox  58      17     1-39%   focal and calcific                       +----------+--------+-------+--------+--------------------------------+--------+ ICA Distal51      9                                                       +----------+--------+-------+--------+--------------------------------+--------+ ECA       56                                                              +----------+--------+-------+--------+--------------------------------+--------+ +----------+--------+--------+--------+-------------------+  SubclavianPSV cm/sEDV cm/sDescribeArm Pressure (mmHG) +----------+--------+--------+--------+-------------------+           52                                          +----------+--------+--------+--------+-------------------+ +---------+--------+--+--------+-+---------+ VertebralPSV cm/s52EDV cm/s9Antegrade +---------+--------+--+--------+-+---------+  Summary: Right Carotid: Velocities in the right ICA are consistent with a 1-39% stenosis. Left Carotid: Velocities in the left ICA are consistent with a 1-39% stenosis. Vertebrals: Bilateral vertebral arteries demonstrate antegrade flow. *See table(s) above for measurements and observations.     Preliminary    Ct Angio Abd/pel W And/or Wo Contrast  Result Date: 11/22/2018 CLINICAL DATA:  fall last night about 2000. Pt reports the fall was mechanical in nature. Pt endorses possible head injury, denies LOC. Denies use blood thinners. Pt was unable to get up out of the floor until EMS arrived this am when son found the pt lying on the floor. Pts only compliant is lumbar pain. Abd mass, pulsatile, AAA suspected. EXAM: CTA ABDOMEN  AND PELVIS WITH CONTRAST TECHNIQUE: Multidetector CT imaging of the abdomen and pelvis was performed using the standard protocol during bolus administration of intravenous contrast. Multiplanar reconstructed images and MIPs were obtained and reviewed to evaluate the vascular anatomy. CONTRAST:  ISOVUE-370 IOPAMIDOL (ISOVUE-370) INJECTION 76% COMPARISON:  06/02/2004 FINDINGS: VASCULAR Aorta: Moderate calcified atheromatous plaque. Small penetrating atheromatous ulcer posteriorly in the infrarenal segment without intramural hematoma. No aneurysm, dissection, or stenosis. Celiac: Patent without evidence of aneurysm, dissection, vasculitis or significant stenosis. SMA: Calcified ostial plaque without significant stenosis, patent distally with classic distal branch anatomy. Renals: Single left, widely patent. Single right, with partially calcified ostial plaque resulting in short segment stenosis of at least mild severity, patent distally. IMA: Patent without evidence of aneurysm, dissection, vasculitis or significant stenosis. Inflow: Moderate tortuosity. Scattered calcified atheromatous plaque. No aneurysm, dissection, or stenosis. Proximal Outflow: Bilateral common femoral and visualized portions of the superficial and profunda femoral arteries are patent without evidence of aneurysm, dissection, vasculitis or significant stenosis. Veins: No obvious venous abnormality within the limitations of this arterial phase study. Review of the MIP images confirms the above findings. NON-VASCULAR Lower chest: Pectus deformity. No pleural or pericardial effusion. Minimal dependent atelectasis in the visualized lung bases. Hepatobiliary: No focal liver abnormality is seen. No gallstones, gallbladder wall thickening, or biliary dilatation. Pancreas: Unremarkable. No pancreatic ductal dilatation or surrounding inflammatory changes. Spleen: Normal in size without focal abnormality. Adrenals/Urinary Tract: Normal adrenals. 1.7  cm upper pole left renal cyst. 2.8 cm right lower pole renal cyst. No hydronephrosis. Urinary bladder physiologically distended. Stomach/Bowel: Stomach and small bowel are nondilated. Appendix surgically absent. Colon is nondilated. Scattered sigmoid diverticula without adjacent inflammatory/edematous change or abscess. Lymphatic: No abdominal or pelvic adenopathy localized. Reproductive: Status post hysterectomy. No adnexal masses. Other: No ascites. No free air. Musculoskeletal: L1 compression deformity with 50% loss of height anteriorly , age indeterminate. Mild thoracolumbar scoliosis. Multilevel spondylitic changes. Grade 1 anterolisthesis L4-5 and L5-S1 probably related to bilateral facet DJD. IMPRESSION: VASCULAR 1. No acute findings. 2. Aortoiliac Atherosclerosis (ICD10-170.0) without aneurysm. 3. Right renal artery ostial stenosis of possible hemodynamic significance. NON-VASCULAR 1. L1 compression deformity, age indeterminate. 2. Sigmoid diverticulosis. Electronically Signed   By: Corlis Leak M.D.   On: 11/22/2018 14:04     CBC Recent Labs  Lab 11/22/18 0925 11/23/18 0550  WBC 16.0* 13.2*  HGB 11.0* 10.5*  HCT 34.1* 32.9*  PLT 147* 133*  MCV 99.1 98.8  MCH 32.0 31.5  MCHC 32.3 31.9  RDW 15.7* 15.9*  LYMPHSABS 0.5*  --   MONOABS 0.8  --   EOSABS 0.0  --   BASOSABS 0.0  --     Chemistries  Recent Labs  Lab 11/22/18 1036 11/22/18 1826 11/23/18 0550  NA 141  --  140  K 3.1*  --  4.1  CL 107  --  111  CO2 23  --  21*  GLUCOSE 104*  --  78  BUN 13  --  16  CREATININE 0.87  --  0.86  CALCIUM 8.4*  --  8.0*  MG  --  2.0  --   AST 35  --   --   ALT 19  --   --   ALKPHOS 44  --   --   BILITOT 1.6*  --   --     No results found for: HGBA1C ------------------------------------------------------------------------------------------------------------------ Recent Labs    11/22/18 1826  TSH 23.031*    ------------------------------------------------------------------------------------------------------------------ No results for input(s): VITAMINB12, FOLATE, FERRITIN, TIBC, IRON, RETICCTPCT in the last 72 hours.  Coagulation profile No results for input(s): INR, PROTIME in the last 168 hours.  No results for input(s): DDIMER in the last 72 hours.  Cardiac Enzymes Recent Labs  Lab 11/22/18 1826 11/22/18 2334 11/23/18 0550  TROPONINI 0.05* 0.04* 0.04*   ------------------------------------------------------------------------------------------------------------------ No results found for: BNP   Shon Hale M.D on 11/24/2018 at 4:10 PM  Pager---[phone] Go to [email or web address] - password [email or web address] for contact info  [facility] - Office  [phone]

## 2018-11-24 NOTE — Progress Notes (Signed)
Overnight EEG with video currently running. No skin break down at time of hook up.
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
## 2018-11-24 NOTE — Progress Notes (Signed)
OT Cancellation Note  Patient Details Name: Alroy DustGretta S Marrufo MRN: [id number] DOB: [birth year]   Cancelled Treatment:    Reason Eval/Treat Not Completed: Patient at procedure or test/ unavailable(Will return as schedule allows. Thank you.)  Guerline Happ M Debra Calabretta Millie Forde MSOT, OTR/L Acute Rehab Pager: [phone] Office: [phone] 11/24/2018, 4:54 PM

## 2018-11-24 NOTE — Consult Note (Signed)
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 atelectasis in the visualized lung bases. Hepatobiliary: No focal liver abnormality is seen. No gallstones, gallbladder wall thickening, or biliary dilatation. Pancreas: Unremarkable. No pancreatic ductal dilatation or surrounding inflammatory changes. Spleen: Normal in size without focal abnormality. Adrenals/Urinary Tract: Normal adrenals. 1.7 cm upper pole left renal cyst. 2.8 cm right lower pole renal cyst. No hydronephrosis. Urinary bladder physiologically distended. Stomach/Bowel: Stomach and small bowel are nondilated. Appendix surgically absent. Colon is nondilated. Scattered sigmoid diverticula without adjacent inflammatory/edematous change or abscess. Lymphatic: No abdominal or pelvic adenopathy localized. Reproductive: Status post hysterectomy. No adnexal masses. Other: No ascites. No free air. Musculoskeletal: L1 compression deformity with 50% loss of height anteriorly , age indeterminate. Mild thoracolumbar scoliosis. Multilevel  spondylitic changes. Grade 1 anterolisthesis L4-5 and L5-S1 probably related to bilateral facet DJD. IMPRESSION: VASCULAR 1. No acute findings. 2. Aortoiliac Atherosclerosis (ICD10-170.0) without aneurysm. 3. Right renal artery ostial stenosis of possible hemodynamic significance. NON-VASCULAR 1. L1 compression deformity, age indeterminate. 2. Sigmoid diverticulosis. Electronically Signed   By: Corlis Leak M.D.   On: 11/22/2018 14:04    Assessment: 84 year old female with recurrent syncope-like spells 1. Syncope work up including carotid ultrasound, telemetry and TTE has so far been unrevealing.   2. DDx for her spells includes seizure and sleep attacks. Of note, she had a spell during the Neurological examination which would be compatible with either etiology, but is felt to favor the latter. During the spell the patient suddenly closed her eyes and appeared asleep. She was unarousable for 2-3 seconds  then regained consciousness after additional verbal stimulation to a drowsy state. Following an additional 10 seconds, she became fully awake and alert again, but did not have a recollection of the spell. There was no associated jerking, tremor or posturing.   Recommendations: 1. LTM EEG to be placed this morning.  2. Further Neurological recommendations following LTM EEG, hopefully incorporating one or more spells for assessment of possible EEG correlate.   Electronically signed: Dr. Caryl Pina 11/24/2018, 1:55 AM

## 2018-11-25 LAB — CBC
HEMATOCRIT: 32.6 % — AB (ref 36.0–46.0)
Hemoglobin: 11.2 g/dL — ABNORMAL LOW (ref 12.0–15.0)
MCH: 31.9 pg (ref 26.0–34.0)
MCHC: 34.4 g/dL (ref 30.0–36.0)
MCV: 92.9 fL (ref 80.0–100.0)
Platelets: 179 10*3/uL (ref 150–400)
RBC: 3.51 MIL/uL — AB (ref 3.87–5.11)
RDW: 15.3 % (ref 11.5–15.5)
WBC: 6.7 10*3/uL (ref 4.0–10.5)
nRBC: 0 % (ref 0.0–0.2)

## 2018-11-25 MED ORDER — ACETAMINOPHEN 325 MG PO TABS
650.0000 mg | ORAL_TABLET | Freq: Once | ORAL | Status: AC
  Administered 2018-11-25: 650 mg via ORAL
  Filled 2018-11-25: qty 2

## 2018-11-25 MED ORDER — METOCLOPRAMIDE HCL 5 MG/ML IJ SOLN
10.0000 mg | Freq: Once | INTRAMUSCULAR | Status: AC
  Administered 2018-11-25: 10 mg via INTRAVENOUS
  Filled 2018-11-25: qty 2

## 2018-11-25 NOTE — Progress Notes (Signed)
Patient minimally responsive to painful stimuli, lethargic, oriented x 4. VSS. Neuro MD notified. Event button pressed. Will continue to monitor

## 2018-11-25 NOTE — NC FL2 (Signed)
[place] MEDICAID FL2 LEVEL OF CARE SCREENING TOOL     IDENTIFICATION  Patient Name: Tara Nolan Birthdate: 09-27-1919 Sex: female Admission Date (Current Location): 11/22/2018  [facility] [place]   Facility and Address:  [facility], [address], [place], [place] [address]      Provider Number: [id number]  Attending Physician Name and Address:  Shon HaleEmokpae, Courage, MD  Relative Name and Phone Number:       Current Level of Care: Hospital Recommended Level of Care: Skilled Nursing Facility Prior Approval Number:    Date Approved/Denied:   PASRR Number: [id number] [id number]  Discharge Plan: SNF    Current Diagnoses: Patient Active Problem List   Diagnosis Date Noted  . Syncope and collapse 11/22/2018  . Back pain 11/22/2018  . Hypothyroid 11/22/2018  . History of CVA (cerebrovascular accident) 09/12/2017  . Fall 09/08/2017  . Right shoulder pain 09/08/2017  . Left wrist fracture 09/08/2017  . History of thyroid disease 09/08/2017  . Chest discomfort 09/08/2017  . Hip pain, acute 06/20/2012  . Knee pain 06/20/2012  . Hamstring tendon rupture 06/20/2012    Orientation RESPIRATION BLADDER Height & Weight     Self, Time, Situation, Place  Normal Incontinent, External catheter Weight: 97 lb (44 kg) Height:  5' (152.4 cm)  BEHAVIORAL SYMPTOMS/MOOD NEUROLOGICAL BOWEL NUTRITION STATUS      Incontinent Diet(see DC summary)  AMBULATORY STATUS COMMUNICATION OF NEEDS Skin   Limited Assist Verbally Normal                       Personal Care Assistance Level of Assistance  Bathing, Feeding, Dressing Bathing Assistance: Limited assistance Feeding assistance: Independent Dressing Assistance: Limited assistance     Functional Limitations Info  Hearing, Speech, Sight Sight Info: Impaired Hearing Info: Adequate Speech Info: Adequate    SPECIAL CARE FACTORS FREQUENCY  PT (By licensed PT), OT (By licensed OT)     PT  Frequency: 5x/wk OT Frequency: 5x/wk            Contractures Contractures Info: Not present    Additional Factors Info  Code Status, Allergies Code Status Info: DNR Allergies Info: NKA           Current Medications (11/25/2018):  This is the current hospital active medication list Current Facility-Administered Medications  Medication Dose Route Frequency Provider Last Rate Last Dose  . acetaminophen (TYLENOL) solution 500 mg  500 mg Oral Q6H PRN Marcellus ScottHongalgi, Anand D, MD   500 mg at 11/25/18 1101  . albuterol (PROVENTIL) (2.5 MG/3ML) 0.083% nebulizer solution 2.5 mg  2.5 mg Nebulization Q2H PRN Hongalgi, Anand D, MD      . cephALEXin (KEFLEX) capsule 250 mg  250 mg Oral TID Shon HaleEmokpae, Courage, MD   250 mg at 11/25/18 1059  . docusate sodium (COLACE) capsule 100 mg  100 mg Oral Daily PRN Hongalgi, Anand D, MD      . hydrALAZINE (APRESOLINE) injection 10 mg  10 mg Intravenous Q8H PRN Emokpae, Courage, MD      . levothyroxine (SYNTHROID, LEVOTHROID) tablet 25 mcg  25 mcg Oral [id number] Shon HaleEmokpae, Courage, MD   25 mcg at 11/25/18 0536  . sodium chloride flush (NS) 0.9 % injection 3 mL  3 mL Intravenous Q12H Elease EtienneHongalgi, Anand D, MD   3 mL at 11/24/18 2235     Discharge Medications: Please see discharge summary for a list of discharge medications.  Relevant Imaging Results:  Relevant  Lab Results:   Additional Information SS#: [id number]  Baldemar LenisElizabeth M Breydan [place], LCSW

## 2018-11-25 NOTE — Progress Notes (Signed)
PT Cancellation Note  Patient Details Name: Tara Nolan MRN: [id number] DOB: 1919/02/18   Cancelled Treatment:    Reason Eval/Treat Not Completed: Patient at procedure or test/unavailable LTM EEG. Will continue to follow.   Kipp LaurenceStephanie R Aaron, PT, DPT Supplemental Physical Therapist 11/25/18 10:59 AM Pager: [phone] Office: [phone]

## 2018-11-25 NOTE — Progress Notes (Signed)
Patient Demographics:    Adonis BrookGretta Posten, is a 84 y.o. female, DOB - 07-Mar-1919, WUJ:[id number]  Admit date - 11/22/2018   Admitting Physician Elease EtienneAnand D Hongalgi, MD  Outpatient Primary MD for the patient is Eartha InchBadger, Michael C, MD  LOS - 2   Chief Complaint  Patient presents with  . 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, no emesis,  No chest pain,  Son at bedside, had   Episodes (sleep attacks).Marland Kitchen.Marland Kitchen.EEG monitoring in progress  Assessment  & Plan :    Principal Problem:   Syncope and collapse Active Problems:   Fall   Chest discomfort   History of CVA (cerebrovascular accident)   Back pain   Hypothyroid  Brief Summary  84 year old female, lives alone, ambulates with the help of a walker, son and daughter-in-law who is a Publishing rights managernurse practitioner live close by to assist, PMH of hypothyroidism who is noncompliant with Synthroid, CVA, prior hospitalization in October 2018 when she was extensively evaluated for syncope without etiology, was admitted because of a fall and left wrist fracture, admitted to  [facility]  on 11/22/2018 due to syncope and fall at home on night prior to admission.  Ongoing frequent episodes of passing out almost daily for the last 1 month as per patient's report today.  Work-up thus far including carotid Dopplers, telemetry monitoring and TTE not revealing for cause.  Concern for possible seizures.   patient being transferred to [facility]  On 11/14/18 for  continuous EEG monitoring and further neurology evaluation.  Plan:-  1) recurrent fainting spells/possible syncope Vs possible seizures--- syncope work-up negative so far, see brief summary above, awaiting final EEG report and further neurology input, continuous EEG monitoring in progress since 09 am 11/24/18  2)Back pain--patient with L1 compression fracture---  , get PT OT eval, continue PRN Tylenol, back pain does  not appear to be severe enough to require brace or kyphoplasty  3) E. coli UTI---- treated with IV Rocephin, okay to complete p.o. Keflex per sensitivity report  4)Hypothyroidism--- TSH is 23, patient is noncompliant with levothyroxine, continue levothyroxine, compliance advised  5) atypical chest pain--- ACS unlikely, troponins are flat, EKG without acute ST changes  6) possible pneumonia---- ??? CAP, received IV Rocephin antibiotics as above #3, no respiratory distress at this time, afebrile, no hypoxia  7)HTN--- BP is improved,  may use IV Hydralazine 10 mg  Every 4 hours Prn for systolic blood pressure over 160 mmhg  8)Anemia and Thrombocytopenia----hemoglobin is stable above 10, no evidence of ongoing bleeding, platelets 179, monitor closely,  Disposition/Need for in-Hospital Stay- patient unable to be discharged at this time due to need to rule out seizures as etiology for recurrent passing out episodes  Code Status : DNR  Family Communication:   Son at bedside  Disposition Plan  : TBD, await PT once EEG is completed  Consults  :  Neurology  DVT Prophylaxis  :   SCDs  Lab Results  Component Value Date   PLT 179 11/25/2018   Inpatient Medications  Scheduled Meds: . cephALEXin  250 mg Oral TID  . levothyroxine  25 mcg Oral Q0600  . sodium chloride flush  3 mL Intravenous Q12H  Continuous Infusions:  PRN Meds:.acetaminophen (TYLENOL) oral liquid 160 mg/5 mL, albuterol, docusate sodium, hydrALAZINE   Anti-infectives (From admission, onward)   Start     Dose/Rate Route Frequency Ordered Stop   11/25/18 1000  cephALEXin (KEFLEX) capsule 250 mg     250 mg Oral 3 times daily 11/24/18 1613 11/27/18 0959   11/24/18 1615  cephALEXin (KEFLEX) capsule 250 mg  Status:  Discontinued     250 mg Oral 3 times daily 11/24/18 1612 11/24/18 1613   11/22/18 1900  cefTRIAXone (ROCEPHIN) 1 g in sodium chloride 0.9 % 100 mL IVPB     1 g 200 mL/hr over 30 Minutes Intravenous Every 24  hours 11/22/18 1748 11/24/18 1832        Objective:   Vitals:   11/25/18 0939 11/25/18 1144 11/25/18 1526 11/25/18 1720  BP: (!) 157/79 (!) 166/81 (!) 156/80 140/66  Pulse: 91 81 74 74  Resp:  14 15 16   Temp:  98.1 F (36.7 C) 99.1 F (37.3 C) 99.1 F (37.3 C)  TempSrc:  Axillary Oral Oral  SpO2: 94% 94% 95% 95%  Weight:      Height:       Wt Readings from Last 3 Encounters:  11/24/18 44 kg  09/11/17 44.5 kg  06/20/12 52.2 kg    Intake/Output Summary (Last 24 hours) at 11/25/2018 1758 Last data filed at 11/25/2018 1600 Gross per 24 hour  Intake 240 ml  Output 500 ml  Net -260 ml   Physical Exam Patient is examined daily including today on 11/25/18, exams remain the same as of yesterday except that has changed   Gen:- Awake Alert,  In no apparent distress  HEENT:- [place].AT, No sclera icterus Neck-Supple Neck,No JVD,.  Lungs-  CTAB , fair symmetrical air movement CV- S1, S2 normal, regular  Abd-  +ve B.Sounds, Abd Soft, No tenderness, no CVA area tenderness Extremity/Skin:- No  edema, pedal pulses present  Psych-affect is appropriate, oriented x3 Neuro-no new focal deficits, no tremors   Data Review:   Micro Results Recent Results (from the past 240 hour(s))  Urine culture     Status: Abnormal   Collection Time: 11/22/18  9:25 AM  Result Value Ref Range Status   Specimen Description   Final    URINE, RANDOM Performed at [facility], [address], [place], [place] [address]    Special Requests   Final    NONE Performed at [facility], [address] [place], [place] [address]    Culture 10,000 COLONIES/mL ESCHERICHIA COLI (A)  Final   Report Status 11/24/2018 FINAL  Final   Organism ID, Bacteria ESCHERICHIA COLI (A)  Final      Susceptibility   Escherichia coli - MIC*    AMPICILLIN >=32 RESISTANT Resistant     CEFAZOLIN <=4 SENSITIVE Sensitive     CEFTRIAXONE <=1 SENSITIVE Sensitive     CIPROFLOXACIN <=0.25  SENSITIVE Sensitive     GENTAMICIN <=1 SENSITIVE Sensitive     IMIPENEM <=0.25 SENSITIVE Sensitive     NITROFURANTOIN <=16 SENSITIVE Sensitive     TRIMETH/SULFA <=20 SENSITIVE Sensitive     AMPICILLIN/SULBACTAM >=32 RESISTANT Resistant     PIP/TAZO <=4 SENSITIVE Sensitive     Extended ESBL NEGATIVE Sensitive     * 10,000 COLONIES/mL ESCHERICHIA COLI   Radiology Reports Dg Chest 2 View  Result Date: 11/22/2018 CLINICAL DATA:  Pt arrives via [facility] from home. Pt reports she had a fall last night about  2000. Pt was unable to get up out of the floor until EMS arrived this morning when son found the pt lying on the floor. Pt complained of lower back and right hip pain. EXAM: CHEST - 2 VIEW COMPARISON:  09/07/2017 FINDINGS: The heart is mildly enlarged and stable in configuration. The aorta is tortuous and partially calcified. There is prominence of interstitial markings, likely chronic. Focal asymmetric opacities are identified in the RIGHT lung apex, raising question of infiltrate or underlying nodule. Recommend follow-up chest x-ray and possible CT if there is failure of these areas to resolve. Scoliosis, convex LEFT. There are numerous thoracic and UPPER lumbar which compression fractures. The age of these fractures is indeterminate. There is no pneumothorax. No acute displaced rib fractures. IMPRESSION: 1. Cardiomegaly. 2. Stable prominence of interstitial markings. 3. Focal opacity in the RIGHT lung apex possibly representing infiltrate versus pulmonary nodules. Follow-up is recommended. Recommend follow-up PA and LATERAL chest to determine if these are persistent. Consider CT of the chest if findings persist. Electronically Signed   By: Norva Pavlov M.D.   On: 11/22/2018 11:03   Ct Head Wo Contrast  Result Date: 11/22/2018 CLINICAL DATA:  Patient fell last evening at 2000 hours. No loss of consciousness. Headache. EXAM: CT HEAD WITHOUT CONTRAST CT CERVICAL SPINE WITHOUT CONTRAST TECHNIQUE:  Multidetector CT imaging of the head and cervical spine was performed following the standard protocol without intravenous contrast. Multiplanar CT image reconstructions of the cervical spine were also generated. COMPARISON:  09/12/2017 MRI, head CT 12/11/2003 FINDINGS: CT HEAD FINDINGS Brain: Age related involutional changes the brain with sulcal prominence consistent with superficial atrophy. No hydrocephalus. Mild-to-moderate small vessel ischemic disease of periventricular white matter appears chronic. No large vascular territory infarct, hemorrhage, midline shift or edema. Midline fourth ventricle and basal cisterns without effacement. Brainstem and cerebellum appear intact. No intra-axial mass nor extra-axial fluid collections. Bilateral basal ganglial idiopathic calcifications are noted. Vascular: No hyperdense vessel sign or unexpected calcifications. Moderate atherosclerosis of the carotid siphons. Skull: Intact Sinuses/Orbits: Mild membrane thickening of the ethmoid sinus. The remainder of the paranasal sinuses are unremarkable. No acute ocular abnormality. Other: Clear mastoids. CT CERVICAL SPINE FINDINGS Alignment: Mild grade 1 anterolisthesis of C3 on C4 and C7 on T1 likely degenerative in etiology. Intact atlantodental interval and craniocervical relationship. Skull base and vertebrae: Intact skull base. No acute cervical spine fracture or jumped facets.No suspicious osseous lesions. Soft tissues and spinal canal: No prevertebral fluid or swelling. No visible canal hematoma. Disc levels: Moderate disc flattening at C2-3 with marked disc flattening from C4 through C7. Small posterior marginal osteophytes are noted from C4 through C7. Bilateral mild facet arthropathy is noted. Mild right-sided C3-4 and bilateral C4-5, C5-6 and C6-7 uncovertebral joint osteoarthritis with uncinate spurring. No significant foraminal encroachment. No significant central canal stenosis. Upper chest: Apical pleuroparenchymal  scarring. Subpleural 6 mm nodular opacity at the right lung apex may be postinfectious or inflammatory related scarring. Nodule not entirely excluded. Aortic atherosclerosis at the arch. Other: Extracranial carotid arteriosclerosis. IMPRESSION: 1. Atrophy with chronic appearing small vessel ischemic disease. No acute intracranial abnormality. 2. Cervical spondylosis without acute posttraumatic cervical spine fracture or subluxation. 3. Subpleural 6 mm nodular opacity at the right lung apex may be postinfectious or inflammatory in etiology. Pulmonary nodule not entirely excluded. Non-contrast chest CT at 6-12 months is recommended. If the nodule is stable at time of repeat CT, then future CT at 18-24 months (from today's scan) is considered optional for low-risk patients,  but is recommended for high-risk patients. This recommendation follows the consensus statement: Guidelines for Management of Incidental Pulmonary Nodules Detected on CT Images: From the Fleischner Society 2017; Radiology 2017; 284:228-243. Electronically Signed   By: Tollie Eth M.D.   On: 11/22/2018 14:07   Ct Cervical Spine Wo Contrast  Result Date: 11/22/2018 CLINICAL DATA:  Patient fell last evening at 2000 hours. No loss of consciousness. Headache. EXAM: CT HEAD WITHOUT CONTRAST CT CERVICAL SPINE WITHOUT CONTRAST TECHNIQUE: Multidetector CT imaging of the head and cervical spine was performed following the standard protocol without intravenous contrast. Multiplanar CT image reconstructions of the cervical spine were also generated. COMPARISON:  09/12/2017 MRI, head CT 12/11/2003 FINDINGS: CT HEAD FINDINGS Brain: Age related involutional changes the brain with sulcal prominence consistent with superficial atrophy. No hydrocephalus. Mild-to-moderate small vessel ischemic disease of periventricular white matter appears chronic. No large vascular territory infarct, hemorrhage, midline shift or edema. Midline fourth ventricle and basal cisterns  without effacement. Brainstem and cerebellum appear intact. No intra-axial mass nor extra-axial fluid collections. Bilateral basal ganglial idiopathic calcifications are noted. Vascular: No hyperdense vessel sign or unexpected calcifications. Moderate atherosclerosis of the carotid siphons. Skull: Intact Sinuses/Orbits: Mild membrane thickening of the ethmoid sinus. The remainder of the paranasal sinuses are unremarkable. No acute ocular abnormality. Other: Clear mastoids. CT CERVICAL SPINE FINDINGS Alignment: Mild grade 1 anterolisthesis of C3 on C4 and C7 on T1 likely degenerative in etiology. Intact atlantodental interval and craniocervical relationship. Skull base and vertebrae: Intact skull base. No acute cervical spine fracture or jumped facets.No suspicious osseous lesions. Soft tissues and spinal canal: No prevertebral fluid or swelling. No visible canal hematoma. Disc levels: Moderate disc flattening at C2-3 with marked disc flattening from C4 through C7. Small posterior marginal osteophytes are noted from C4 through C7. Bilateral mild facet arthropathy is noted. Mild right-sided C3-4 and bilateral C4-5, C5-6 and C6-7 uncovertebral joint osteoarthritis with uncinate spurring. No significant foraminal encroachment. No significant central canal stenosis. Upper chest: Apical pleuroparenchymal scarring. Subpleural 6 mm nodular opacity at the right lung apex may be postinfectious or inflammatory related scarring. Nodule not entirely excluded. Aortic atherosclerosis at the arch. Other: Extracranial carotid arteriosclerosis. IMPRESSION: 1. Atrophy with chronic appearing small vessel ischemic disease. No acute intracranial abnormality. 2. Cervical spondylosis without acute posttraumatic cervical spine fracture or subluxation. 3. Subpleural 6 mm nodular opacity at the right lung apex may be postinfectious or inflammatory in etiology. Pulmonary nodule not entirely excluded. Non-contrast chest CT at 6-12 months is  recommended. If the nodule is stable at time of repeat CT, then future CT at 18-24 months (from today's scan) is considered optional for low-risk patients, but is recommended for high-risk patients. This recommendation follows the consensus statement: Guidelines for Management of Incidental Pulmonary Nodules Detected on CT Images: From the Fleischner Society 2017; Radiology 2017; 284:228-243. Electronically Signed   By: Tollie Eth M.D.   On: 11/22/2018 14:07   Ct L-spine No Charge  Result Date: 11/22/2018 CLINICAL DATA:  Low back pain after a fall last night. Initial encounter. EXAM: CT LUMBAR SPINE WITHOUT CONTRAST TECHNIQUE: Multidetector CT imaging of the lumbar spine was performed without intravenous contrast administration. Multiplanar CT image reconstructions were also generated. COMPARISON:  None. FINDINGS: Segmentation: 5 lumbar type vertebrae. Alignment: Thoracolumbar levoscoliosis. Minimal retrolisthesis of T11 on T12 and L1 on L2. 6 mm anterolisthesis of L4 on L5 and 9 mm anterolisthesis of L5 on S1 with a unilateral right-sided L5 pars defect. Vertebrae: L1 inferior endplate  compression fracture with superimposed central Schmorl's node resulting in severe vertebral body height loss centrally, favored to be chronic. No suspicious osseous lesion. Diffuse osteopenia. Paraspinal and other soft tissues: Bilateral posterior paraspinal muscle fatty atrophy. Intra-abdominal contents reported separately. Disc levels: Advanced disc space narrowing at T11-12 and from L2-3 to L5-S1 with degenerative endplate sclerosis, spurring, and vacuum disc at most levels. L1-2: Disc bulging, spurring, and mild L1 inferior endplate retropulsion result in moderate right neural foraminal stenosis without spinal stenosis. L2-3: Disc bulging, endplate spurring, and mild facet and ligamentum flavum hypertrophy result in mild spinal stenosis and mild bilateral neural foraminal stenosis. L3-4: Disc bulging, endplate spurring, and  mild facet and ligamentum flavum hypertrophy result in mild spinal stenosis and mild right and moderate left neural foraminal stenosis. L4-5: Anterolisthesis with bulging uncovered disc and facet hypertrophy result in moderate spinal stenosis and moderate right and severe left neural foraminal stenosis. L5-S1: Anterolisthesis with bulging uncovered disc results in moderate right greater than left neural foraminal stenosis and mild spinal stenosis. IMPRESSION: 1. L1 inferior endplate compression fracture in Schmorl's node deformity, favored to be chronic. 2. Grade 1 anterolisthesis at L4-5 with moderate spinal and moderate to severe neural foraminal stenosis. 3. 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 Electronically Signed   By: Sebastian Ache M.D.   On: 11/22/2018 14:08   Dg Hip Unilat W Or Wo Pelvis 2-3 Views Right  Result Date: 11/22/2018 CLINICAL DATA:  Pt arrives via [facility] from home. Pt reports she had a fall last night about 2000. Pt was unable to get up out of the floor until EMS arrived this morning when son found the pt lying on the floor. Pt complained of lower back and right hip pain. EXAM: DG HIP (WITH OR WITHOUT PELVIS) 2-3V RIGHT COMPARISON:  06/20/2012 FINDINGS: Bones appear radiolucent. There is no acute fracture or subluxation. Mild degenerative changes are seen in the LOWER lumbar spine. There is atherosclerotic calcification of the femoral arteries. IMPRESSION: No evidence for acute abnormality. Electronically Signed   By: Norva Pavlov M.D.   On: 11/22/2018 11:07   Vas US Carotid  Result Date: 11/25/2018 Carotid Arterial Duplex Study Indications:       Syncope. Limitations:       vessels tortuosity Comparison Study:  No available comparison study Performing Technologist: Annamaria Helling  Examination Guidelines: A complete evaluation includes B-mode imaging, spectral Doppler, color Doppler, and power Doppler as  needed of all accessible portions of each vessel. Bilateral testing is considered an integral part of a complete examination. Limited examinations for reoccurring indications may be performed as noted.  Right Carotid Findings: +----------+--------+--------+--------+------------------+--------+           PSV cm/sEDV cm/sStenosisDescribe          Comments +----------+--------+--------+--------+------------------+--------+ CCA Prox  74      10                                tortuous +----------+--------+--------+--------+------------------+--------+ CCA Distal41      9               focal and calcific         +----------+--------+--------+--------+------------------+--------+ ICA Prox  47      8       1-39%   focal and calcific         +----------+--------+--------+--------+------------------+--------+ ICA Mid   62  15                                         +----------+--------+--------+--------+------------------+--------+ ICA Distal70      17                                         +----------+--------+--------+--------+------------------+--------+ ECA       66                                                 +----------+--------+--------+--------+------------------+--------+ +----------+--------+-------+--------+-------------------+           PSV cm/sEDV cmsDescribeArm Pressure (mmHG) +----------+--------+-------+--------+-------------------+ [id number]                                         +----------+--------+-------+--------+-------------------+ +---------+--------+--+--------+-+---------+ VertebralPSV cm/s32EDV cm/s8Antegrade +---------+--------+--+--------+-+---------+  Left Carotid Findings: +----------+--------+-------+--------+--------------------------------+--------+           PSV cm/sEDV    StenosisDescribe                        Comments                   cm/s                                                     +----------+--------+-------+--------+--------------------------------+--------+ CCA Prox  49      7                                                       +----------+--------+-------+--------+--------------------------------+--------+ CCA Mid                          diffuse, hyperechoic and                                                  calcific                                 +----------+--------+-------+--------+--------------------------------+--------+ CCA Distal52      10             diffuse and hypoechoic                   +----------+--------+-------+--------+--------------------------------+--------+ ICA Prox  58      17     1-39%   focal and calcific                       +----------+--------+-------+--------+--------------------------------+--------+ ICA Distal51      9                                                       +----------+--------+-------+--------+--------------------------------+--------+  ECA       56                                                              +----------+--------+-------+--------+--------------------------------+--------+ +----------+--------+--------+--------+-------------------+ SubclavianPSV cm/sEDV cm/sDescribeArm Pressure (mmHG) +----------+--------+--------+--------+-------------------+           52                                          +----------+--------+--------+--------+-------------------+ +---------+--------+--+--------+-+---------+ VertebralPSV cm/s52EDV cm/s9Antegrade +---------+--------+--+--------+-+---------+  Summary: Right Carotid: Velocities in the right ICA are consistent with a 1-39% stenosis. Left Carotid: Velocities in the left ICA are consistent with a 1-39% stenosis. Vertebrals: Bilateral vertebral arteries demonstrate antegrade flow. *See table(s) above for measurements and observations.  Electronically signed by Waverly Ferrari MD on 11/25/2018 at 6:40:54 AM.    Final     Ct Angio Abd/pel W And/or Wo Contrast  Result Date: 11/22/2018 CLINICAL DATA:  fall last night about 2000. Pt reports the fall was mechanical in nature. Pt endorses possible head injury, denies LOC. Denies use blood thinners. Pt was unable to get up out of the floor until EMS arrived this am when son found the pt lying on the floor. Pts only compliant is lumbar pain. Abd mass, pulsatile, AAA suspected. EXAM: CTA ABDOMEN AND PELVIS WITH CONTRAST TECHNIQUE: Multidetector CT imaging of the abdomen and pelvis was performed using the standard protocol during bolus administration of intravenous contrast. Multiplanar reconstructed images and MIPs were obtained and reviewed to evaluate the vascular anatomy. CONTRAST:  ISOVUE-370 IOPAMIDOL (ISOVUE-370) INJECTION 76% COMPARISON:  06/02/2004 FINDINGS: VASCULAR Aorta: Moderate calcified atheromatous plaque. Small penetrating atheromatous ulcer posteriorly in the infrarenal segment without intramural hematoma. No aneurysm, dissection, or stenosis. Celiac: Patent without evidence of aneurysm, dissection, vasculitis or significant stenosis. SMA: Calcified ostial plaque without significant stenosis, patent distally with classic distal branch anatomy. Renals: Single left, widely patent. Single right, with partially calcified ostial plaque resulting in short segment stenosis of at least mild severity, patent distally. IMA: Patent without evidence of aneurysm, dissection, vasculitis or significant stenosis. Inflow: Moderate tortuosity. Scattered calcified atheromatous plaque. No aneurysm, dissection, or stenosis. Proximal Outflow: Bilateral common femoral and visualized portions of the superficial and profunda femoral arteries are patent without evidence of aneurysm, dissection, vasculitis or significant stenosis. Veins: No obvious venous abnormality within the limitations of this arterial phase study. Review of the MIP images confirms the above findings. NON-VASCULAR  Lower chest: Pectus deformity. No pleural or pericardial effusion. Minimal dependent atelectasis in the visualized lung bases. Hepatobiliary: No focal liver abnormality is seen. No gallstones, gallbladder wall thickening, or biliary dilatation. Pancreas: Unremarkable. No pancreatic ductal dilatation or surrounding inflammatory changes. Spleen: Normal in size without focal abnormality. Adrenals/Urinary Tract: Normal adrenals. 1.7 cm upper pole left renal cyst. 2.8 cm right lower pole renal cyst. No hydronephrosis. Urinary bladder physiologically distended. Stomach/Bowel: Stomach and small bowel are nondilated. Appendix surgically absent. Colon is nondilated. Scattered sigmoid diverticula without adjacent inflammatory/edematous change or abscess. Lymphatic: No abdominal or pelvic adenopathy localized. Reproductive: Status post hysterectomy. No adnexal masses. Other: No ascites. No free air. Musculoskeletal: L1 compression deformity with 50% loss of height anteriorly , age indeterminate. Mild thoracolumbar scoliosis.  Multilevel spondylitic changes. Grade 1 anterolisthesis L4-5 and L5-S1 probably related to bilateral facet DJD. IMPRESSION: VASCULAR 1. No acute findings. 2. Aortoiliac Atherosclerosis (ICD10-170.0) without aneurysm. 3. Right renal artery ostial stenosis of possible hemodynamic significance. NON-VASCULAR 1. L1 compression deformity, age indeterminate. 2. Sigmoid diverticulosis. Electronically Signed   By: Corlis Leak M.D.   On: 11/22/2018 14:04     CBC Recent Labs  Lab 11/22/18 0925 11/23/18 0550 11/25/18 0907  WBC 16.0* 13.2* 6.7  HGB 11.0* 10.5* 11.2*  HCT 34.1* 32.9* 32.6*  PLT 147* 133* 179  MCV 99.1 98.8 92.9  MCH 32.0 31.5 31.9  MCHC 32.3 31.9 34.4  RDW 15.7* 15.9* 15.3  LYMPHSABS 0.5*  --   --   MONOABS 0.8  --   --   EOSABS 0.0  --   --   BASOSABS 0.0  --   --     Chemistries  Recent Labs  Lab 11/22/18 1036 11/22/18 1826 11/23/18 0550  NA 141  --  140  K 3.1*  --  4.1    CL 107  --  111  CO2 23  --  21*  GLUCOSE 104*  --  78  BUN 13  --  16  CREATININE 0.87  --  0.86  CALCIUM 8.4*  --  8.0*  MG  --  2.0  --   AST 35  --   --   ALT 19  --   --   ALKPHOS 44  --   --   BILITOT 1.6*  --   --     No results found for: HGBA1C ------------------------------------------------------------------------------------------------------------------ Recent Labs    11/22/18 1826  TSH 23.031*   Cardiac Enzymes Recent Labs  Lab 11/22/18 1826 11/22/18 2334 11/23/18 0550  TROPONINI 0.05* 0.04* 0.04*   ------------------------------------------------------------------------------------------------------------------ No results found for: BNP   Shon Hale M.D on 11/25/2018 at 5:58 PM  Pager---[phone] Go to [email or web address] - password [email or web address] for contact info  [facility] - Office  [phone]

## 2018-11-25 NOTE — Care Management Note (Signed)
Case Management Note  Patient Details  Name: Tara Nolan MRN: [id number] Date of Birth: 07-25-1919  Subjective/Objective:      Pt admitted with syncope and collapse. She is from home alone.               Action/Plan: Recommendations are for SNF or HH if family can provide 24 hour supervision. CM met with the son and the family is not able to provide 24 hour supervision. They would like patient to go to [facility] or [place] for rehab. CSW updated. CM following.  Expected Discharge Date:                  Expected Discharge Plan:  Skilled Nursing Facility  In-House Referral:  Clinical Social Work  Discharge planning Services     Post Acute Care Choice:    Choice offered to:     DME Arranged:    DME Agency:     HH Arranged:    [place] Agency:     Status of Service:  In process, will continue to follow  If discussed at Long Length of Stay Meetings, dates discussed:    Additional Comments:  Pollie Friar, RN 11/25/2018, 12:38 PM

## 2018-11-25 NOTE — Progress Notes (Addendum)
NEUROLOGY PROGRESS NOTE  Subjective: Called to bedside at approximately 0931 due to patient stating to the nurse that she felt very lethargic and tired.  Apparently at that point she is very difficult to arouse and closed her eyes.  Nurse did push the button when this happened to recorded on the LTM.  When I entered the room patient was very lethargic but awoke easily to noxious stimuli.  Was able to tell me that she is in the hospital, year was 2019 and the month is December.  Patient was able to follow commands.  Patient states that this often happens to her and she had no sleep last night.  Exam: Vitals:   11/25/18 0750 11/25/18 0939  BP: (!) 158/79 (!) 157/79  Pulse: 78 91  Resp: 15   Temp: 98.6 F (37 C)   SpO2: 96% 94%    Physical Exam   HEENT-  Normocephalic, no lesions, without obvious abnormality.  Normal external eye and conjunctiva.   Extremities- Warm, dry and intact Musculoskeletal-no joint tenderness, deformity or swelling Skin-warm and dry, no hyperpigmentation, vitiligo, or suspicious lesions    Neuro:  Mental Status: Alert, oriented, thought content appropriate.  Speech fluent without evidence of aphasia.  Able to follow simple commands without difficulty. Cranial Nerves: II:  Visual fields grossly normal,  III,IV, VI: ptosis not present, extra-ocular motions intact bilaterally pupils equal, round, reactive to light and accommodation V,VII: Face symmetric, VIII: hearing normal bilaterally  Motor: Moving all extremities antigravity with 4/5 strength Sensory: Sensation intact to noxious stimuli   Medications:  Scheduled: . cephALEXin  250 mg Oral TID  . levothyroxine  25 mcg Oral Q0600  . sodium chloride flush  3 mL Intravenous Q12H   Continuous:   Pertinent Labs/Diagnostics: No pertinent labs today  LTM: DAY #1: from 0944 11/24/18 to 0730 11/25/18  BACKGROUND: An overall medium voltage continuous recording with good spontaneous variability and  reactivity. Waking background consisted of a medium voltage 7-8 Hz posterior dominant rhythm bilaterally with low voltage beta activity in the bilateral frontocentral regions and frequent medium voltage theta activity diffusely. Sleep was captured with normal stage II sleep architecture.   EPILEPTIFORM/PERIODIC ACTIVITY: none SEIZURES: none EVENTS: none  EKG: irregular depolarizations  SUMMARY: This was a mildly abnormal continuous video EEG due to mild slowing of the background, suggestive of an underlying encephalopathy. No seizures or epileptiform discharges were seen. There was an abnormality in the EKG channel as described above.  No results found.  Assessment: Currently hooked up to LTM.  As noted above no epileptiform discharges were seen overnight.  During episode at 930 did not have any epileptiform discharges.  Would like to capture a few more episodes.  At this point I feel these are sleep attacks less likely seizures    Recommendations: -Continue LTM   Felicie MornDavid Smith PA-C [facility] [phone]   11/25/2018, 10:01 AM  I have seen the patient reviewed the above note.  She did have a spell which family reports are spells of concern, and there is no clear EEG change.  On my exam, she is somnolent but easily arousable.  Once aroused she is conversant and appropriate.  I would like to capture another spell if possible.  If not, however we will discontinue EEG monitoring either way tomorrow.    Ritta SlotMcNeill Jacquiline Zurcher, MD [facility] [phone]  If 7pm- 7am, please page neurology on call as listed in AMION.

## 2018-11-25 NOTE — Progress Notes (Signed)
EEG LTM maint complete. No skin breakdown. Continue to monitor 

## 2018-11-25 NOTE — Procedures (Signed)
LTM-EEG Report  HISTORY: Continuous video-EEG monitoring performed for 84 year old with syncope vs seizure. ACQUISITION: International 10-20 system for electrode placement; 18 channels with additional eyes linked to ipsilateral ears and EKG. Additional T1-T2 electrodes were used. Continuous video recording obtained.   EEG NUMBER:  MEDICATIONS:  Day 1: see EMR  DAY #1: from [id number] 11/24/18 to 0730 11/25/18  BACKGROUND: An overall medium voltage continuous recording with good spontaneous variability and reactivity. Waking background consisted of a medium voltage 7-8 Hz posterior dominant rhythm bilaterally with low voltage beta activity in the bilateral frontocentral regions and frequent medium voltage theta activity diffusely. Sleep was captured with normal stage II sleep architecture.   EPILEPTIFORM/PERIODIC ACTIVITY: none SEIZURES: none EVENTS: none  EKG: irregular depolarizations  SUMMARY: This was a mildly abnormal continuous video EEG due to mild slowing of the background, suggestive of an underlying encephalopathy. No seizures or epileptiform discharges were seen. There was an abnormality in the EKG channel as described above.

## 2018-11-25 NOTE — Progress Notes (Signed)
OT Cancellation Note  Patient Details Name: Tara Nolan MRN: [id number] DOB: 1919/06/04   Cancelled Treatment:    Reason Eval/Treat Not Completed: Patient at procedure or test/ unavailable(EEG)  Thornell MuleWARD,HILLARY  Zoye Chandra, OT/L   Acute OT Clinical Specialist Acute Rehabilitation Services Pager [phone] Office [phone]  11/25/2018, 12:31 PM

## 2018-11-26 MED ORDER — AMLODIPINE BESYLATE 2.5 MG PO TABS
2.5000 mg | ORAL_TABLET | Freq: Every day | ORAL | Status: DC
  Administered 2018-11-27: 2.5 mg via ORAL
  Filled 2018-11-26: qty 1

## 2018-11-26 MED ORDER — AMLODIPINE BESYLATE 5 MG PO TABS: 5.0000 mg | ORAL_TABLET | Freq: Every day | ORAL | Status: DC

## 2018-11-26 NOTE — Care Management Important Message (Signed)
Important Message  Patient Details  Name: Tara Nolan MRN: [id number] Date of Birth: 12/04/1918   Medicare Important Message Given:  Yes    Shasta Chinn P Nelly Scriven 11/26/2018, 12:41 PM

## 2018-11-26 NOTE — Progress Notes (Signed)
vLTM EEG complete. No skin breakdown 

## 2018-11-26 NOTE — Progress Notes (Signed)
LTM report for day 2 has resulted.  Patient had 2 ordered events that were captured.  Both were lethargy without epileptic correlation.  During the whole LTM it was mildly abnormal secondary to mild slowing of the background, suggestive of underlying encephalopathy.  No seizure or epileptiform discharges were seen.  Events of lethargy had no EEG correlation.  Formal report below:  LTM-EEG Report  HISTORY: Continuous video-EEG monitoring performed for15year old with syncope vs seizure. ACQUISITION: International 10-20 system for electrode placement; 18 channels with additional eyes linked to ipsilateral ears and EKG. Additional T1-T2 electrodes were used. Continuous video recording obtained.   EEG NUMBER:  MEDICATIONS:  Day 2: see EMR  DAY #2: from0730 12/31/19to 0730 11/26/18  BACKGROUND: An overall medium voltage continuous recording with good spontaneous variability and reactivity. Waking background consisted of a medium voltage7-8Hz  posterior dominant rhythm bilaterally with low voltage beta activity in the bilateral frontocentral regions and frequentmedium voltage theta activity diffusely. Sleep was captured with normal stage II sleep architecture.   EPILEPTIFORM/PERIODIC ACTIVITY: none SEIZURES: none EVENTS: Two reported events of lethargy without EEG correlate.  RWE:[id number] depolarizations  SUMMARY: This wasa mildly abnormal continuous video EEG due to mild slowing of the background, suggestive of an underlying encephalopathy. No seizures or epileptiform discharges were seen. Events of lethargy had no EEG correlate. There was an abnormality in the EKG channel as described above.         Patient at this point needs an outpatient sleep study.  This likely can be done through PCCM as an outpatient for Dr. Vickey Huger from [facility].  No further neurological recommendations.  Neurology will sign off.

## 2018-11-26 NOTE — Progress Notes (Signed)
CSW following for discharge plan. Tara Nolan has accepted patient, but unable to coordinate discharge today as the pharmacy will not be able to get patient's medications. CSW to continue to follow.  Blenda Nicely, [place] Clinical Social Worker [phone]

## 2018-11-26 NOTE — Procedures (Signed)
LTM-EEG Report  HISTORY: Continuous video-EEG monitoring performed for 85 year old with syncope vs seizure. ACQUISITION: International 10-20 system for electrode placement; 18 channels with additional eyes linked to ipsilateral ears and EKG. Additional T1-T2 electrodes were used. Continuous video recording obtained.   EEG NUMBER:  MEDICATIONS:  Day 2: see EMR  DAY #2: from 0730 11/25/18 to 0730 11/26/18  BACKGROUND: An overall medium voltage continuous recording with good spontaneous variability and reactivity. Waking background consisted of a medium voltage 7-8 Hz posterior dominant rhythm bilaterally with low voltage beta activity in the bilateral frontocentral regions and frequent medium voltage theta activity diffusely. Sleep was captured with normal stage II sleep architecture.   EPILEPTIFORM/PERIODIC ACTIVITY: none SEIZURES: none EVENTS: Two reported events of lethargy without EEG correlate.  EKG: irregular depolarizations  SUMMARY: This was a mildly abnormal continuous video EEG due to mild slowing of the background, suggestive of an underlying encephalopathy. No seizures or epileptiform discharges were seen. Events of lethargy had no EEG correlate. There was an abnormality in the EKG channel as described above.

## 2018-11-26 NOTE — Progress Notes (Signed)
CM talked to patient's son Elijah Birk; DCP - patient is to go to SNF at [facility]; SW is aware and facilitating discharge; Alexis Goodell [phone]

## 2018-11-26 NOTE — Progress Notes (Signed)
Physical Therapy Treatment Patient Details Name: Tara Nolan MRN: [id number] DOB: 26-May-1919 Today's Date: 11/26/2018    History of Present Illness 86 year old female, lives alone, ambulates with the help of a walker. presented to [facility] long ED on 11/22/2018 due to syncope and fall at home on night prior to admission. PMH of hypothyroid who is noncompliant with Synthroid, CVA, prior hospitalization in October 2018 when she was extensively evaluated for syncope without etiology, was admitted because of a fall and left wrist fracture. patient being transferred to [facility] when bed available for continuous EEG monitoring    PT Comments    Pt admitted with above diagnosis. Pt currently with functional limitations due to balance and endurance deficits. Pt was able to ambulate with RW with steadying assist needed.  Will follow acutely and recommend SNF as pt needs to be more steady with RW prior to d/c home.   Updated d/c plan and frequency. Pt will benefit from skilled PT to increase their independence and safety with mobility to allow discharge to the venue listed below.     Follow Up Recommendations  Supervision/Assistance - 24 hour;SNF(family cannot provide 24/7 initally)     Equipment Recommendations  None recommended by PT    Recommendations for Other Services       Precautions / Restrictions Precautions Precautions: Fall Restrictions Weight Bearing Restrictions: No    Mobility  Bed Mobility Overal bed mobility: Needs Assistance Bed Mobility: Supine to Sit;Sit to Supine     Supine to sit: Supervision Sit to supine: Supervision      Transfers Overall transfer level: Needs assistance Equipment used: Rolling walker (2 wheeled) Transfers: Sit to/from Stand Sit to Stand: Min guard         General transfer comment: steadying assist upon rising.   Ambulation/Gait Ambulation/Gait assistance: Min guard;Min assist Gait Distance (Feet): 240 Feet Assistive device: Rolling  walker (2 wheeled) Gait Pattern/deviations: Step-through pattern;Decreased stride length;Trunk flexed;Drifts right/left   Gait velocity interpretation: <1.31 ft/sec, indicative of household ambulator General Gait Details: Pt needed cues to stay close to RW.  Pt needed cues to steer RW at times. Pt overall fairly steady with occasional steadying assist.     Stairs             Wheelchair Mobility    Modified Rankin (Stroke Patients Only)       Balance Overall balance assessment: History of Falls;Needs assistance Sitting-balance support: No upper extremity supported;Feet supported Sitting balance-Leahy Scale: Good     Standing balance support: Bilateral upper extremity supported;During functional activity Standing balance-Leahy Scale: Poor Standing balance comment: relies on UE support for balance with RW                            Cognition Arousal/Alertness: Awake/alert Behavior During Therapy: WFL for tasks assessed/performed Overall Cognitive Status: History of cognitive impairments - at baseline                                 General Comments: Pt tangential; apparent STM deficits; deficits with reasoning/insight/judgement and awareness      Exercises      General Comments        Pertinent Vitals/Pain Pain Assessment: No/denies pain    Home Living                      Prior Function  PT Goals (current goals can now be found in the care plan section) Progress towards PT goals: Progressing toward goals    Frequency    Min 2X/week      PT Plan Discharge plan needs to be updated;Frequency needs to be updated    Co-evaluation              AM-PAC PT "6 Clicks" Mobility   Outcome Measure  Help needed turning from your back to your side while in a flat bed without using bedrails?: A Little Help needed moving from lying on your back to sitting on the side of a flat bed without using bedrails?: A  Little Help needed moving to and from a bed to a chair (including a wheelchair)?: A Little Help needed standing up from a chair using your arms (e.g., wheelchair or bedside chair)?: A Lot Help needed to walk in hospital room?: A Lot Help needed climbing 3-5 steps with a railing? : A Lot 6 Click Score: 15    End of Session Equipment Utilized During Treatment: Gait belt Activity Tolerance: Patient limited by fatigue Patient left: with chair alarm set;with call bell/phone within reach;in chair Nurse Communication: Mobility status PT Visit Diagnosis: Muscle weakness (generalized) (M62.81);History of falling (Z91.81)     Time: 6734-1937 PT Time Calculation (min) (ACUTE ONLY): 18 min  Charges:  $Gait Training: 8-22 mins                     Laquasha Groome,PT Acute Rehabilitation Services Pager:  [phone]  Office:  [phone]     Berline Lopes 11/26/2018, 3:23 PM

## 2018-11-26 NOTE — Progress Notes (Signed)
Patient Demographics:    Tara Nolan, is a 85 y.o. female, DOB - [birth year], ZOX:[id number]  Admit date - 11/22/2018   Admitting Physician Tara Etienne, MD  Outpatient Primary MD for the patient is Tara Inch, MD  LOS - 3   Chief Complaint  Patient presents with  . 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,  No chest pain,  Son at bedside, no new concerns, remains weak/fatigued, some difficulties with mobility related ADLs  Assessment  & Plan :    Principal Problem:   Syncope and collapse Active Problems:   Fall   Chest discomfort   History of CVA (cerebrovascular accident)   Back pain   Hypothyroid  Brief Summary  85 year old female, lives alone, ambulates with the help of a walker, son and daughter-in-law who is a Publishing rights manager live close by to assist, PMH of hypothyroidism who is noncompliant with Synthroid, CVA, prior hospitalization in October 2018 when she was extensively evaluated for syncope without etiology, was admitted because of a fall and left wrist fracture, admitted to  [facility]  on 11/22/2018 due to syncope and fall at home on night prior to admission.  Ongoing frequent episodes of passing out almost daily for the last 1 month as per patient's report today.  Work-up thus far including carotid Dopplers, telemetry monitoring and TTE not revealing for cause.  Concern for possible seizures.   patient  transferred to [facility]  On 11/14/18 for  continuous EEG monitoring and further neurology evaluation.  Plan:-  1) recurrent fainting spells/possible syncope Vs possible seizures--- syncope work-up negative so far, see brief summary above, final EEG report and further neurology input appreciated,, patient had continuous EEG monitoring in progress since 09 am 11/24/18, at this time no clear epileptiform findings.... Neurologist suspect sleep  disorder with patient passing out/falling asleep during the day (sleep attacks).... Outpatient sleep study advised  2)Back pain--patient with L1 compression fracture---  ,PT OT eval appreciated, continue PRN Tylenol, back pain does not appear to be severe enough to require brace or kyphoplasty  3) E. coli UTI---- treated with IV Rocephin, okay to complete p.o. Keflex per sensitivity report  4)Hypothyroidism--- TSH is 23, patient is noncompliant with levothyroxine, continue levothyroxine, compliance advised  5) atypical chest pain--- ACS unlikely, troponins are flat, EKG without acute ST changes  6) possible pneumonia---- ??? CAP, received IV Rocephin antibiotics as above #3, no respiratory distress at this time, afebrile, no hypoxia  7)HTN--- BP remains elevated, start amlodipine 2.5 mg daily,,  may use IV Hydralazine 10 mg  Every 4 hours Prn for systolic blood pressure over 160 mmhg  8)Anemia and Thrombocytopenia----hemoglobin is stable above 10, no evidence of ongoing bleeding, platelets stable, monitor closely,  Disposition/Need for in-Hospital Stay- patient unable to be discharged at this time due to need for insurance authorization to go to SNF rehab  Code Status : DNR  Family Communication:   Son at bedside  Disposition Plan  :  SNF rehab  Consults  :  Neurology  DVT Prophylaxis  :   SCDs  Lab Results  Component Value Date   PLT 179 11/25/2018   Inpatient Medications  Scheduled  Meds: . [START ON 11/27/2018] amLODipine  2.5 mg Oral Daily  . cephALEXin  250 mg Oral TID  . levothyroxine  25 mcg Oral Q0600  . sodium chloride flush  3 mL Intravenous Q12H   Continuous Infusions:  PRN Meds:.acetaminophen (TYLENOL) oral liquid 160 mg/5 mL, albuterol, docusate sodium, hydrALAZINE   Anti-infectives (From admission, onward)   Start     Dose/Rate Route Frequency Ordered Stop   11/25/18 1000  cephALEXin (KEFLEX) capsule 250 mg     250 mg Oral 3 times daily 11/24/18 1613  11/27/18 0959   11/24/18 1615  cephALEXin (KEFLEX) capsule 250 mg  Status:  Discontinued     250 mg Oral 3 times daily 11/24/18 1612 11/24/18 1613   11/22/18 1900  cefTRIAXone (ROCEPHIN) 1 g in sodium chloride 0.9 % 100 mL IVPB     1 g 200 mL/hr over 30 Minutes Intravenous Every 24 hours 11/22/18 1748 11/24/18 1832        Objective:   Vitals:   11/26/18 0316 11/26/18 0808 11/26/18 1242 11/26/18 1606  BP: (!) 159/80 (!) 178/96 (!) 184/87 (!) 173/94  Pulse: 72 81 93 90  Resp: 17 16 16 18   Temp: 98.7 F (37.1 C) 98.4 F (36.9 C)  98.7 F (37.1 C)  TempSrc: Oral Oral  Oral  SpO2: 97% 94% 96% 96%  Weight:      Height:       Wt Readings from Last 3 Encounters:  11/24/18 44 kg  09/11/17 44.5 kg  06/20/12 52.2 kg   No intake or output data in the 24 hours ending 11/26/18 1752 Physical Exam Patient is examined daily including today on 09/27/2019, exams remain the same as of yesterday except that has changed   Gen:- Awake Alert,  In no apparent distress  HEENT:- [place].AT, No sclera icterus Neck-Supple Neck,No JVD,.  Lungs-  CTAB , fair symmetrical air movement CV- S1, S2 normal, regular  Abd-  +ve B.Sounds, Abd Soft, No tenderness, no CVA area tenderness Extremity/Skin:- No  edema, pedal pulses present  Psych-affect is appropriate, oriented x3 Neuro-no new focal deficits, no tremors   Data Review:   Micro Results Recent Results (from the past 240 hour(s))  Urine culture     Status: Abnormal   Collection Time: 11/22/18  9:25 AM  Result Value Ref Range Status   Specimen Description   Final    URINE, RANDOM Performed at [facility], [address], [place], [place] [address]    Special Requests   Final    NONE Performed at [facility], [address] [place], [place] [address]    Culture 10,000 COLONIES/mL ESCHERICHIA COLI (A)  Final   Report Status 11/24/2018 FINAL  Final   Organism ID, Bacteria ESCHERICHIA COLI (A)  Final       Susceptibility   Escherichia coli - MIC*    AMPICILLIN >=32 RESISTANT Resistant     CEFAZOLIN <=4 SENSITIVE Sensitive     CEFTRIAXONE <=1 SENSITIVE Sensitive     CIPROFLOXACIN <=0.25 SENSITIVE Sensitive     GENTAMICIN <=1 SENSITIVE Sensitive     IMIPENEM <=0.25 SENSITIVE Sensitive     NITROFURANTOIN <=16 SENSITIVE Sensitive     TRIMETH/SULFA <=20 SENSITIVE Sensitive     AMPICILLIN/SULBACTAM >=32 RESISTANT Resistant     PIP/TAZO <=4 SENSITIVE Sensitive     Extended ESBL NEGATIVE Sensitive     * 10,000 COLONIES/mL ESCHERICHIA COLI   Radiology Reports Dg Chest 2 View  Result Date:  11/22/2018 CLINICAL DATA:  Pt arrives via [facility] from home. Pt reports she had a fall last night about 2000. Pt was unable to get up out of the floor until EMS arrived this morning when son found the pt lying on the floor. Pt complained of lower back and right hip pain. EXAM: CHEST - 2 VIEW COMPARISON:  09/07/2017 FINDINGS: The heart is mildly enlarged and stable in configuration. The aorta is tortuous and partially calcified. There is prominence of interstitial markings, likely chronic. Focal asymmetric opacities are identified in the RIGHT lung apex, raising question of infiltrate or underlying nodule. Recommend follow-up chest x-ray and possible CT if there is failure of these areas to resolve. Scoliosis, convex LEFT. There are numerous thoracic and UPPER lumbar which compression fractures. The age of these fractures is indeterminate. There is no pneumothorax. No acute displaced rib fractures. IMPRESSION: 1. Cardiomegaly. 2. Stable prominence of interstitial markings. 3. Focal opacity in the RIGHT lung apex possibly representing infiltrate versus pulmonary nodules. Follow-up is recommended. Recommend follow-up PA and LATERAL chest to determine if these are persistent. Consider CT of the chest if findings persist. Electronically Signed   By: Norva Pavlov M.D.   On: 11/22/2018 11:03   Ct Head Wo Contrast  Result  Date: 11/22/2018 CLINICAL DATA:  Patient fell last evening at 2000 hours. No loss of consciousness. Headache. EXAM: CT HEAD WITHOUT CONTRAST CT CERVICAL SPINE WITHOUT CONTRAST TECHNIQUE: Multidetector CT imaging of the head and cervical spine was performed following the standard protocol without intravenous contrast. Multiplanar CT image reconstructions of the cervical spine were also generated. COMPARISON:  09/12/2017 MRI, head CT 12/11/2003 FINDINGS: CT HEAD FINDINGS Brain: Age related involutional changes the brain with sulcal prominence consistent with superficial atrophy. No hydrocephalus. Mild-to-moderate small vessel ischemic disease of periventricular white matter appears chronic. No large vascular territory infarct, hemorrhage, midline shift or edema. Midline fourth ventricle and basal cisterns without effacement. Brainstem and cerebellum appear intact. No intra-axial mass nor extra-axial fluid collections. Bilateral basal ganglial idiopathic calcifications are noted. Vascular: No hyperdense vessel sign or unexpected calcifications. Moderate atherosclerosis of the carotid siphons. Skull: Intact Sinuses/Orbits: Mild membrane thickening of the ethmoid sinus. The remainder of the paranasal sinuses are unremarkable. No acute ocular abnormality. Other: Clear mastoids. CT CERVICAL SPINE FINDINGS Alignment: Mild grade 1 anterolisthesis of C3 on C4 and C7 on T1 likely degenerative in etiology. Intact atlantodental interval and craniocervical relationship. Skull base and vertebrae: Intact skull base. No acute cervical spine fracture or jumped facets.No suspicious osseous lesions. Soft tissues and spinal canal: No prevertebral fluid or swelling. No visible canal hematoma. Disc levels: Moderate disc flattening at C2-3 with marked disc flattening from C4 through C7. Small posterior marginal osteophytes are noted from C4 through C7. Bilateral mild facet arthropathy is noted. Mild right-sided C3-4 and bilateral C4-5,  C5-6 and C6-7 uncovertebral joint osteoarthritis with uncinate spurring. No significant foraminal encroachment. No significant central canal stenosis. Upper chest: Apical pleuroparenchymal scarring. Subpleural 6 mm nodular opacity at the right lung apex may be postinfectious or inflammatory related scarring. Nodule not entirely excluded. Aortic atherosclerosis at the arch. Other: Extracranial carotid arteriosclerosis. IMPRESSION: 1. Atrophy with chronic appearing small vessel ischemic disease. No acute intracranial abnormality. 2. Cervical spondylosis without acute posttraumatic cervical spine fracture or subluxation. 3. Subpleural 6 mm nodular opacity at the right lung apex may be postinfectious or inflammatory in etiology. Pulmonary nodule not entirely excluded. Non-contrast chest CT at 6-12 months is recommended. If the nodule is stable at  time of repeat CT, then future CT at 18-24 months (from today's scan) is considered optional for low-risk patients, but is recommended for high-risk patients. This recommendation follows the consensus statement: Guidelines for Management of Incidental Pulmonary Nodules Detected on CT Images: From the Fleischner Society 2017; Radiology 2017; 284:228-243. Electronically Signed   By: Tollie Eth M.D.   On: 11/22/2018 14:07   Ct Cervical Spine Wo Contrast  Result Date: 11/22/2018 CLINICAL DATA:  Patient fell last evening at 2000 hours. No loss of consciousness. Headache. EXAM: CT HEAD WITHOUT CONTRAST CT CERVICAL SPINE WITHOUT CONTRAST TECHNIQUE: Multidetector CT imaging of the head and cervical spine was performed following the standard protocol without intravenous contrast. Multiplanar CT image reconstructions of the cervical spine were also generated. COMPARISON:  09/12/2017 MRI, head CT 12/11/2003 FINDINGS: CT HEAD FINDINGS Brain: Age related involutional changes the brain with sulcal prominence consistent with superficial atrophy. No hydrocephalus. Mild-to-moderate small  vessel ischemic disease of periventricular white matter appears chronic. No large vascular territory infarct, hemorrhage, midline shift or edema. Midline fourth ventricle and basal cisterns without effacement. Brainstem and cerebellum appear intact. No intra-axial mass nor extra-axial fluid collections. Bilateral basal ganglial idiopathic calcifications are noted. Vascular: No hyperdense vessel sign or unexpected calcifications. Moderate atherosclerosis of the carotid siphons. Skull: Intact Sinuses/Orbits: Mild membrane thickening of the ethmoid sinus. The remainder of the paranasal sinuses are unremarkable. No acute ocular abnormality. Other: Clear mastoids. CT CERVICAL SPINE FINDINGS Alignment: Mild grade 1 anterolisthesis of C3 on C4 and C7 on T1 likely degenerative in etiology. Intact atlantodental interval and craniocervical relationship. Skull base and vertebrae: Intact skull base. No acute cervical spine fracture or jumped facets.No suspicious osseous lesions. Soft tissues and spinal canal: No prevertebral fluid or swelling. No visible canal hematoma. Disc levels: Moderate disc flattening at C2-3 with marked disc flattening from C4 through C7. Small posterior marginal osteophytes are noted from C4 through C7. Bilateral mild facet arthropathy is noted. Mild right-sided C3-4 and bilateral C4-5, C5-6 and C6-7 uncovertebral joint osteoarthritis with uncinate spurring. No significant foraminal encroachment. No significant central canal stenosis. Upper chest: Apical pleuroparenchymal scarring. Subpleural 6 mm nodular opacity at the right lung apex may be postinfectious or inflammatory related scarring. Nodule not entirely excluded. Aortic atherosclerosis at the arch. Other: Extracranial carotid arteriosclerosis. IMPRESSION: 1. Atrophy with chronic appearing small vessel ischemic disease. No acute intracranial abnormality. 2. Cervical spondylosis without acute posttraumatic cervical spine fracture or subluxation. 3.  Subpleural 6 mm nodular opacity at the right lung apex may be postinfectious or inflammatory in etiology. Pulmonary nodule not entirely excluded. Non-contrast chest CT at 6-12 months is recommended. If the nodule is stable at time of repeat CT, then future CT at 18-24 months (from today's scan) is considered optional for low-risk patients, but is recommended for high-risk patients. This recommendation follows the consensus statement: Guidelines for Management of Incidental Pulmonary Nodules Detected on CT Images: From the Fleischner Society 2017; Radiology 2017; 284:228-243. Electronically Signed   By: Tollie Eth M.D.   On: 11/22/2018 14:07   Ct L-spine No Charge  Result Date: 11/22/2018 CLINICAL DATA:  Low back pain after a fall last night. Initial encounter. EXAM: CT LUMBAR SPINE WITHOUT CONTRAST TECHNIQUE: Multidetector CT imaging of the lumbar spine was performed without intravenous contrast administration. Multiplanar CT image reconstructions were also generated. COMPARISON:  None. FINDINGS: Segmentation: 5 lumbar type vertebrae. Alignment: Thoracolumbar levoscoliosis. Minimal retrolisthesis of T11 on T12 and L1 on L2. 6 mm anterolisthesis of L4 on L5  and 9 mm anterolisthesis of L5 on S1 with a unilateral right-sided L5 pars defect. Vertebrae: L1 inferior endplate compression fracture with superimposed central Schmorl's node resulting in severe vertebral body height loss centrally, favored to be chronic. No suspicious osseous lesion. Diffuse osteopenia. Paraspinal and other soft tissues: Bilateral posterior paraspinal muscle fatty atrophy. Intra-abdominal contents reported separately. Disc levels: Advanced disc space narrowing at T11-12 and from L2-3 to L5-S1 with degenerative endplate sclerosis, spurring, and vacuum disc at most levels. L1-2: Disc bulging, spurring, and mild L1 inferior endplate retropulsion result in moderate right neural foraminal stenosis without spinal stenosis. L2-3: Disc bulging,  endplate spurring, and mild facet and ligamentum flavum hypertrophy result in mild spinal stenosis and mild bilateral neural foraminal stenosis. L3-4: Disc bulging, endplate spurring, and mild facet and ligamentum flavum hypertrophy result in mild spinal stenosis and mild right and moderate left neural foraminal stenosis. L4-5: Anterolisthesis with bulging uncovered disc and facet hypertrophy result in moderate spinal stenosis and moderate right and severe left neural foraminal stenosis. L5-S1: Anterolisthesis with bulging uncovered disc results in moderate right greater than left neural foraminal stenosis and mild spinal stenosis. IMPRESSION: 1. L1 inferior endplate compression fracture in Schmorl's node deformity, favored to be chronic. 2. Grade 1 anterolisthesis at L4-5 with moderate spinal and moderate to severe neural foraminal stenosis. 3. 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 Electronically Signed   By: Sebastian AcheAllen  Grady M.D.   On: 11/22/2018 14:08   Dg Hip Unilat W Or Wo Pelvis 2-3 Views Right  Result Date: 11/22/2018 CLINICAL DATA:  Pt arrives via [facility] from home. Pt reports she had a fall last night about 2000. Pt was unable to get up out of the floor until EMS arrived this morning when son found the pt lying on the floor. Pt complained of lower back and right hip pain. EXAM: DG HIP (WITH OR WITHOUT PELVIS) 2-3V RIGHT COMPARISON:  06/20/2012 FINDINGS: Bones appear radiolucent. There is no acute fracture or subluxation. Mild degenerative changes are seen in the LOWER lumbar spine. There is atherosclerotic calcification of the femoral arteries. IMPRESSION: No evidence for acute abnormality. Electronically Signed   By: Norva PavlovElizabeth  Brown M.D.   On: 11/22/2018 11:07   Vas [place] Carotid  Result Date: 11/25/2018 Carotid Arterial Duplex Study Indications:       Syncope. Limitations:       vessels tortuosity Comparison Study:  No  available comparison study Performing Technologist: Annamaria HellingOLE, HONGYING  Examination Guidelines: A complete evaluation includes B-mode imaging, spectral Doppler, color Doppler, and power Doppler as needed of all accessible portions of each vessel. Bilateral testing is considered an integral part of a complete examination. Limited examinations for reoccurring indications may be performed as noted.  Right Carotid Findings: +----------+--------+--------+--------+------------------+--------+           PSV cm/sEDV cm/sStenosisDescribe          Comments +----------+--------+--------+--------+------------------+--------+ CCA Prox  74      10                                tortuous +----------+--------+--------+--------+------------------+--------+ CCA Distal41      9               focal and calcific         +----------+--------+--------+--------+------------------+--------+ ICA Prox  47      8       1-39%  focal and calcific         +----------+--------+--------+--------+------------------+--------+ ICA Mid   62      15                                         +----------+--------+--------+--------+------------------+--------+ ICA Distal70      17                                         +----------+--------+--------+--------+------------------+--------+ ECA       66                                                 +----------+--------+--------+--------+------------------+--------+ +----------+--------+-------+--------+-------------------+           PSV cm/sEDV cmsDescribeArm Pressure (mmHG) +----------+--------+-------+--------+-------------------+ [id number]                                         +----------+--------+-------+--------+-------------------+ +---------+--------+--+--------+-+---------+ VertebralPSV cm/s32EDV cm/s8Antegrade +---------+--------+--+--------+-+---------+  Left Carotid Findings:  +----------+--------+-------+--------+--------------------------------+--------+           PSV cm/sEDV    StenosisDescribe                        Comments                   cm/s                                                    +----------+--------+-------+--------+--------------------------------+--------+ CCA Prox  49      7                                                       +----------+--------+-------+--------+--------------------------------+--------+ CCA Mid                          diffuse, hyperechoic and                                                  calcific                                 +----------+--------+-------+--------+--------------------------------+--------+ CCA Distal52      10             diffuse and hypoechoic                   +----------+--------+-------+--------+--------------------------------+--------+ ICA Prox  58      17     1-39%   focal and calcific                       +----------+--------+-------+--------+--------------------------------+--------+  ICA Distal51      9                                                       +----------+--------+-------+--------+--------------------------------+--------+ ECA       56                                                              +----------+--------+-------+--------+--------------------------------+--------+ +----------+--------+--------+--------+-------------------+ SubclavianPSV cm/sEDV cm/sDescribeArm Pressure (mmHG) +----------+--------+--------+--------+-------------------+           52                                          +----------+--------+--------+--------+-------------------+ +---------+--------+--+--------+-+---------+ VertebralPSV cm/s52EDV cm/s9Antegrade +---------+--------+--+--------+-+---------+  Summary: Right Carotid: Velocities in the right ICA are consistent with a 1-39% stenosis. Left Carotid: Velocities in the left ICA are  consistent with a 1-39% stenosis. Vertebrals: Bilateral vertebral arteries demonstrate antegrade flow. *See table(s) above for measurements and observations.  Electronically signed by Waverly Ferrarihristopher Dickson MD on 11/25/2018 at 6:40:54 AM.    Final    Ct Angio Abd/pel W And/or Wo Contrast  Result Date: 11/22/2018 CLINICAL DATA:  fall last night about 2000. Pt reports the fall was mechanical in nature. Pt endorses possible head injury, denies LOC. Denies use blood thinners. Pt was unable to get up out of the floor until EMS arrived this am when son found the pt lying on the floor. Pts only compliant is lumbar pain. Abd mass, pulsatile, AAA suspected. EXAM: CTA ABDOMEN AND PELVIS WITH CONTRAST TECHNIQUE: Multidetector CT imaging of the abdomen and pelvis was performed using the standard protocol during bolus administration of intravenous contrast. Multiplanar reconstructed images and MIPs were obtained and reviewed to evaluate the vascular anatomy. CONTRAST:  100mL ISOVUE-370 IOPAMIDOL (ISOVUE-370) INJECTION 76% COMPARISON:  06/02/2004 FINDINGS: VASCULAR Aorta: Moderate calcified atheromatous plaque. Small penetrating atheromatous ulcer posteriorly in the infrarenal segment without intramural hematoma. No aneurysm, dissection, or stenosis. Celiac: Patent without evidence of aneurysm, dissection, vasculitis or significant stenosis. SMA: Calcified ostial plaque without significant stenosis, patent distally with classic distal branch anatomy. Renals: Single left, widely patent. Single right, with partially calcified ostial plaque resulting in short segment stenosis of at least mild severity, patent distally. IMA: Patent without evidence of aneurysm, dissection, vasculitis or significant stenosis. Inflow: Moderate tortuosity. Scattered calcified atheromatous plaque. No aneurysm, dissection, or stenosis. Proximal Outflow: Bilateral common femoral and visualized portions of the superficial and profunda femoral arteries are  patent without evidence of aneurysm, dissection, vasculitis or significant stenosis. Veins: No obvious venous abnormality within the limitations of this arterial phase study. Review of the MIP images confirms the above findings. NON-VASCULAR Lower chest: Pectus deformity. No pleural or pericardial effusion. Minimal dependent atelectasis in the visualized lung bases. Hepatobiliary: No focal liver abnormality is seen. No gallstones, gallbladder wall thickening, or biliary dilatation. Pancreas: Unremarkable. No pancreatic ductal dilatation or surrounding inflammatory changes. Spleen: Normal in size without focal abnormality. Adrenals/Urinary Tract: Normal adrenals. 1.7 cm upper pole left renal cyst. 2.8 cm right lower pole renal cyst. No  hydronephrosis. Urinary bladder physiologically distended. Stomach/Bowel: Stomach and small bowel are nondilated. Appendix surgically absent. Colon is nondilated. Scattered sigmoid diverticula without adjacent inflammatory/edematous change or abscess. Lymphatic: No abdominal or pelvic adenopathy localized. Reproductive: Status post hysterectomy. No adnexal masses. Other: No ascites. No free air. Musculoskeletal: L1 compression deformity with 50% loss of height anteriorly , age indeterminate. Mild thoracolumbar scoliosis. Multilevel spondylitic changes. Grade 1 anterolisthesis L4-5 and L5-S1 probably related to bilateral facet DJD. IMPRESSION: VASCULAR 1. No acute findings. 2. Aortoiliac Atherosclerosis (ICD10-170.0) without aneurysm. 3. Right renal artery ostial stenosis of possible hemodynamic significance. NON-VASCULAR 1. L1 compression deformity, age indeterminate. 2. Sigmoid diverticulosis. Electronically Signed   By: Corlis Leak M.D.   On: 11/22/2018 14:04     CBC Recent Labs  Lab 11/22/18 0925 11/23/18 0550 11/25/18 0907  WBC 16.0* 13.2* 6.7  HGB 11.0* 10.5* 11.2*  HCT 34.1* 32.9* 32.6*  PLT 147* 133* 179  MCV 99.1 98.8 92.9  MCH 32.0 31.5 31.9  MCHC 32.3 31.9 34.4    RDW 15.7* 15.9* 15.3  LYMPHSABS 0.5*  --   --   MONOABS 0.8  --   --   EOSABS 0.0  --   --   BASOSABS 0.0  --   --     Chemistries  Recent Labs  Lab 11/22/18 1036 11/22/18 1826 11/23/18 0550  NA 141  --  140  K 3.1*  --  4.1  CL 107  --  111  CO2 23  --  21*  GLUCOSE 104*  --  78  BUN 13  --  16  CREATININE 0.87  --  0.86  CALCIUM 8.4*  --  8.0*  MG  --  2.0  --   AST 35  --   --   ALT 19  --   --   ALKPHOS 44  --   --   BILITOT 1.6*  --   --     No results found for: HGBA1C ------------------------------------------------------------------------------------------------------------------ No results for input(s): TSH, T4TOTAL, T3FREE, THYROIDAB in the last 72 hours.  Invalid input(s): FREET3 Cardiac Enzymes Recent Labs  Lab 11/22/18 1826 11/22/18 2334 11/23/18 0550  TROPONINI 0.05* 0.04* 0.04*   ------------------------------------------------------------------------------------------------------------------ No results found for: BNP   Shon Hale M.D on 11/26/2018 at 5:52 PM  Pager---[phone] Go to [email or web address] - password [email or web address] for contact info  [facility] - Office  [phone]

## 2018-11-27 MED ORDER — ACETAMINOPHEN 325 MG PO TABS: 650.0000 mg | ORAL_TABLET | ORAL | 2 refills | Status: AC | PRN

## 2018-11-27 MED ORDER — HYDROCODONE-ACETAMINOPHEN 5-325 MG PO TABS: 1.0000 | ORAL_TABLET | Freq: Four times a day (QID) | ORAL | 0 refills | Status: DC | PRN

## 2018-11-27 MED ORDER — AMLODIPINE BESYLATE 2.5 MG PO TABS: 2.5000 mg | ORAL_TABLET | Freq: Every day | ORAL | 2 refills | Status: DC

## 2018-11-27 MED ORDER — SENNOSIDES-DOCUSATE SODIUM 8.6-50 MG PO TABS: 2.0000 | ORAL_TABLET | Freq: Every day | ORAL | 1 refills | Status: AC

## 2018-11-27 MED ORDER — LEVOTHYROXINE SODIUM 25 MCG PO TABS: 25.0000 ug | ORAL_TABLET | Freq: Every day | ORAL | 2 refills | Status: DC

## 2018-11-27 MED ORDER — ACETAMINOPHEN 500 MG PO TABS: 500.0000 mg | ORAL_TABLET | Freq: Four times a day (QID) | ORAL | 0 refills | Status: DC | PRN

## 2018-11-27 MED ORDER — METHOCARBAMOL 500 MG PO TABS: 500.0000 mg | ORAL_TABLET | Freq: Two times a day (BID) | ORAL | 2 refills | Status: DC

## 2018-11-27 NOTE — Clinical Social Work Placement (Signed)
Nurse to call report to [phone], Room 1206P     CLINICAL SOCIAL WORK PLACEMENT  NOTE  Date:  11/27/2018  Patient Details  Name: Tara Nolan MRN: [id number] Date of Birth: 1918/12/14  Clinical Social Work is seeking post-discharge placement for this patient at the Skilled  Nursing Facility level of care (*CSW will initial, date and re-position this form in  chart as items are completed):  Yes   Patient/family provided with [place] Clinical Social Work Department's list of facilities offering this level of care within the geographic area requested by the patient (or if unable, by the patient's family).  Yes   Patient/family informed of their freedom to choose among providers that offer the needed level of care, that participate in Medicare, Medicaid or managed care program needed by the patient, have an available bed and are willing to accept the patient.  Yes   Patient/family informed of 's ownership interest in [facility] and [facility], as well as of the fact that they are under no obligation to receive care at these facilities.  PASRR submitted to EDS on 11/25/18     PASRR number received on       Existing PASRR number confirmed on       FL2 transmitted to all facilities in geographic area requested by pt/family on 11/25/18     FL2 transmitted to all facilities within larger geographic area on       Patient informed that his/her managed care company has contracts with or will negotiate with certain facilities, including the following:        Yes   Patient/family informed of bed offers received.  Patient chooses bed at [facility]     Physician recommends and patient chooses bed at      Patient to be transferred to [facility] on 11/27/18.  Patient to be transferred to facility by Family car     Patient family notified on 11/27/18 of transfer.  Name of family member notified:  Daughter     PHYSICIAN       Additional Comment:     _______________________________________________ Baldemar Lenis, LCSW 11/27/2018, 11:15 AM

## 2018-11-27 NOTE — Progress Notes (Signed)
Pt being discharged from hospital per orders from MD. Pt educated on discharge instructions. Pt verbalized understanding of instructions. All questions and concerns were addressed. Pt's IV was removed prior to discharge. Pt exited hospital via wheelchair accompanied by staff. RN called and gave report to Reuel Boom at [facility].

## 2018-11-27 NOTE — Progress Notes (Signed)
Occupational Therapy Treatment Patient Details Name: Tara Nolan MRN: [id number] DOB: July 19, 1919 Today's Date: 11/27/2018    History of present illness 85 year old female, lives alone, ambulates with the help of a walker. [facility]/2018 due to syncope and fall at home on night prior to admission. PMH of hypothyroid who is noncompliant with Synthroid, CVA, prior hospitalization in October 2018 when she was extensively evaluated for syncope without etiology, was admitted because of a fall and left wrist fracture. patient being [facility] when bed available for continuous EEG monitoring   OT comments  Pt progressing towards OT goals, presents sitting EOB pleasant and willing to participate in therapy session. Pt requiring minA for functional mobility using RW, with one slight LOB when turning from sink to return to EOB requiring minA to recover. Pt completing standing grooming ADL with minA, continued grooming ADLs in sitting with setup-minguard assist. Pt reports feeling her LEs are weaker compared to her baseline. Noted plans for ST rehab in SNF setting after discharge. Given pt's current level of function and need for 24hr supervision/assist feel this recommendation remains appropriate, discharge recommendations have been updated to reflect. Will continue to follow while she remains in acute setting to progress pt towards established OT goals.    Follow Up Recommendations  SNF;Supervision/Assistance - 24 hour    Equipment Recommendations  None recommended by OT          Precautions / Restrictions Precautions Precautions: Fall Restrictions Weight Bearing Restrictions: No       Mobility Bed Mobility               General bed mobility comments: Pt seated EOB upon arrival to room  Transfers Overall transfer level: Needs assistance Equipment used: Rolling walker (2 wheeled) Transfers: Sit to/from Stand Sit to Stand: Min assist          General transfer comment: steadying assist upon rising. pt able to boost into standing using bil UEs on EOB     Balance Overall balance assessment: History of Falls;Needs assistance Sitting-balance support: No upper extremity supported;Feet supported Sitting balance-Leahy Scale: Good     Standing balance support: Bilateral upper extremity supported;During functional activity Standing balance-Leahy Scale: Poor Standing balance comment: relies on UE support for balance/external assist                           ADL either performed or assessed with clinical judgement   ADL Overall ADL's : Needs assistance/impaired     Grooming: Brushing hair;Min guard;Minimal assistance;Sitting;Standing Grooming Details (indicate cue type and reason): performing in both sitting and standing; minA for standing balance                             Functional mobility during ADLs: Minimal assistance;Rolling walker General ADL Comments: pt with one minor LOB when turning from sink to ambulate back to EOB requiring minA to recover     Vision       Perception     Praxis      Cognition Arousal/Alertness: Awake/alert Behavior During Therapy: WFL for tasks assessed/performed Overall Cognitive Status: History of cognitive impairments - at baseline                                 General Comments: Pt tangential; apparent STM deficits; able to  verbalize her awareness of being increasingly weak/unsteady on her feet at this time        Exercises     Shoulder Instructions       General Comments      Pertinent Vitals/ Pain       Pain Assessment: No/denies pain  Home Living                                          Prior Functioning/Environment              Frequency  Min 2X/week        Progress Toward Goals  OT Goals(current goals can now be found in the care plan section)  Progress towards OT goals: Progressing toward  goals  Acute Rehab OT Goals Patient Stated Goal: wants to go home OT Goal Formulation: With patient Time For Goal Achievement: 12/08/17 Potential to Achieve Goals: Good ADL Goals Pt Will Perform Lower Body Bathing: with modified independence;sit to/from stand Pt Will Perform Lower Body Dressing: with modified independence;sit to/from stand Pt Will Transfer to Toilet: with modified independence;bedside commode;ambulating Pt Will Perform Toileting - Clothing Manipulation and hygiene: with modified independence;sit to/from stand Additional ADL Goal #1: Pt/family will independently verbalize 3 strategies to reduce risk of falls  Plan Discharge plan needs to be updated    Co-evaluation                 AM-PAC OT "6 Clicks" Daily Activity     Outcome Measure   Help from another person eating meals?: None Help from another person taking care of personal grooming?: A Little Help from another person toileting, which includes using toliet, bedpan, or urinal?: A Little Help from another person bathing (including washing, rinsing, drying)?: A Little Help from another person to put on and taking off regular upper body clothing?: A Little Help from another person to put on and taking off regular lower body clothing?: A Little 6 Click Score: 19    End of Session Equipment Utilized During Treatment: Gait belt;Rolling walker  OT Visit Diagnosis: Unsteadiness on feet (R26.81);History of falling (Z91.81);Muscle weakness (generalized) (M62.81);Other symptoms and signs involving cognitive function   Activity Tolerance Patient tolerated treatment well   Patient Left with call bell/phone within reach;with bed alarm set;with family/visitor present(sitting EOB)   Nurse Communication Mobility status        Time: 3832-9191 OT Time Calculation (min): 31 min  Charges: OT General Charges $OT Visit: 1 Visit OT Treatments $Self Care/Home Management : 23-37 mins  Marcy Siren,  OT Supplemental Rehabilitation Services Pager [phone] Office [phone]    Orlando Penner 11/27/2018, 12:08 PM

## 2018-11-27 NOTE — Discharge Summary (Signed)
Tara Nolan, is a 85 y.o. female  DOB 1919/02/22  MRN [id number].  Admission date:  11/22/2018  Admitting Physician  Elease Etienne, MD  Discharge Date:  11/27/2018   Primary MD  [name], MD  Recommendations for primary care physician for things to follow:   1) repeat TSH test in about 4 weeks 2) outpatient sleep study post rehab 3) follow-up with neurologist as an outpatient after discharge from rehab  Admission Diagnosis  Back pain [M54.9] Syncope and collapse [R55]   Discharge Diagnosis  Back pain [M54.9] Syncope and collapse [R55]    Principal Problem:   Syncope and collapse Active Problems:   Fall   Chest discomfort   History of CVA (cerebrovascular accident)   Back pain   Hypothyroid      Past Medical History:  Diagnosis Date  . Stroke (HCC)   . Thyroid disease     Past Surgical History:  Procedure Laterality Date  . ABDOMINAL HYSTERECTOMY    . APPENDECTOMY    . HERNIA REPAIR    . THYROIDECTOMY         HPI  from the history and physical done on the day of admission:    Chief Complaint: Suspected that she passed out at home on night prior to admission, sustained a fall and complains of low back pain.  HPI: Tara Nolan is a very pleasant 85 year old female, lives alone, ambulates with the help of a walker, son and daughter-in-law who is a Publishing rights manager live close by to assist, PMH of hypothyroid who is noncompliant with Synthroid, CVA, prior hospitalization and October 2018 when she was extensively evaluated for syncope without etiology, was admitted because of a fall and left wrist fracture, presented to [facility] ED on 11/22/2018 following an episode of passing out at home and fall.  History provided by patient, her son and daughter-in-law at bedside.  Son called patient at around 6:30 PM on night prior to admission and patient was in her usual  state of health and was getting ready to go to bed (does retire early).  She states that around 7:30 PM, she heard somebody at the door and got up with the help of a walker.  She reports some dizziness and lightheadedness which apparently are chronic for her and then apparently passed out.  When her daughter from [place] called her at 7 this morning, patient did not respond.  Son then went home and found patient sitting up in the living room approximately 20 feet away from her upturned walker.  There was no bleeding.  She was alert but slightly confused.  He activated EMS and patient was brought to the ED.  He reports that patient had brief, few seconds of transient unresponsiveness with EMS and EDP also reported to me that patient had a few seconds of unresponsiveness while laying in bed without seizure-like activity or incontinence.  She was unfortunately not on telemetry at that time.  She reports mild acute on chronic low back pain.  She has new onset of cough with mild clear sputum, mild dyspnea but no fever or chills.  She complains of weak left-sided sharp intermittent nonradiating chest pain but cannot elaborate.  She has chronic hip pain and does not ambulate much.  ED Course: Lab work significant for hemoglobin 11, WBC 16, platelets 147, potassium 3.1, urine microscopy negative for UTI features, CK 229, chest x-ray shows focal opacity in the right lung apex possibly representing infiltrate versus pulmonary nodules CT head without acute findings CT neck without acute findings CT lumbar spine shows chronic L1 inferior endplate compression fracture and L4-5 moderate spinal stenosis CT angiogram of abdomen and pelvis shows no acute findings.   Hospital Course:    Brief Summary  85 year old female, lives alone, ambulates with the help of a walker, son and daughter-in-law who is a Publishing rights manager live close by to assist, PMH of hypothyroidism who is noncompliant with Synthroid, CVA, prior  hospitalizationinOctober 2018 when she was extensively evaluated for syncope without etiology, was admitted because of a fall and left wrist fracture, admitted to  [facility]  on 12/28/2019due to syncope and fall at home on night prior to admission. Ongoing frequent episodes of passing out almost daily for the last 1 month as per patient's report today. Work-up thus far including carotid Dopplers, telemetry monitoring and TTE not revealing for cause. Concern for possible seizures.   patient  transferred to [facility]  On 11/14/18 for  continuous EEG monitoring and further neurology evaluation.  Plan:-  1)Recurrent fainting spells/possible syncope Vs possible seizures--- syncope work-up negative so far, see brief summary above, final EEG report and further neurology input appreciated,, patient had continuous EEG monitoring in progress since 09 am 11/24/18, at this time no clear epileptiform findings.... Neurologist suspect sleep disorder with patient passing out/falling asleep during the day (sleep attacks).... Outpatient sleep study advised and further neurology evaluation advised post discharge from rehab  2)Back pain--patient with L1 compression fracture---  ,PT OT eval appreciated, continue PRN Tylenol, methocarbamol 500 twice daily, may use hydrocodone as needed, back pain does not appear to be severe enough to require brace or kyphoplasty  3) E. coli UTI---- treated with IV Rocephin, completed p.o. Keflex per sensitivity report  4)Hypothyroidism--- TSH is 23, patient is noncompliant with levothyroxine, continue levothyroxine, compliance advised, repeat TSH in 4 weeks  5) atypical chest pain--- ACS unlikely, troponins are flat, EKG without acute ST changes  6) possible pneumonia---- ??? CAP, received IV Rocephin antibiotics as above #3, no respiratory distress at this time, afebrile, no hypoxia--- clinically pneumonia unlikely  7)HTN--- BP improved, continue amlodipine 2.5 mg daily,,     8)Anemia and Thrombocytopenia----patient appears to have history of chronic anemia, etiology not clear , hemoglobin is stable above 10, no evidence of ongoing bleeding, platelets  have normalized  Code Status : DNR  Family Communication:   daughter at bedside  Disposition Plan  :  SNF rehab  Consults  :  Neurology  DVT Prophylaxis  :   SCDs  Discharge Condition: stable  Follow UP  Contact information for after-discharge care    Destination    [facility] .   Service:  Skilled Nursing Contact information: [address] [place] [address] [phone]               Diet and Activity recommendation:  As advised  Discharge Instructions    Discharge Instructions    Call MD for:  difficulty breathing, headache or visual disturbances  Complete by:  As directed    Call MD for:  persistant nausea and vomiting   Complete by:  As directed    Call MD for:  severe uncontrolled pain   Complete by:  As directed    Call MD for:  temperature >100.4   Complete by:  As directed    Diet - low sodium heart healthy   Complete by:  As directed    Discharge instructions   Complete by:  As directed    1) repeat TSH test in about 4 weeks 2) outpatient sleep study post rehab 3) follow-up with neurologist as an outpatient after discharge from rehab   Increase activity slowly   Complete by:  As directed         Discharge Medications     Allergies as of 11/27/2018      Reactions   Other Other (See Comments)   All medications make me bonkers       Medication List    STOP taking these medications   docusate sodium 100 MG capsule Commonly known as:  COLACE     TAKE these medications   acetaminophen 325 MG tablet Commonly known as:  TYLENOL Take 2 tablets (650 mg total) by mouth every 4 (four) hours as needed for mild pain, fever or headache. What changed:    medication strength  how much to take  when to take  this  reasons to take this  additional instructions   amLODipine 2.5 MG tablet Commonly known as:  NORVASC Take 1 tablet (2.5 mg total) by mouth daily.   HYDROcodone-acetaminophen 5-325 MG tablet Commonly known as:  NORCO Take 1 tablet by mouth every 6 (six) hours as needed for moderate pain or severe pain.   levothyroxine 25 MCG tablet Commonly known as:  SYNTHROID, LEVOTHROID Take 1 tablet (25 mcg total) by mouth daily before breakfast. What changed:  how much to take   methocarbamol 500 MG tablet Commonly known as:  ROBAXIN Take 1 tablet (500 mg total) by mouth 2 (two) times daily.   senna-docusate 8.6-50 MG tablet Commonly known as:  Senokot-S Take 2 tablets by mouth at bedtime.       Major procedures and Radiology Reports - PLEASE review detailed and final reports for all details, in brief -   Dg Chest 2 View  Result Date: 11/22/2018 CLINICAL DATA:  Pt arrives via [facility] from home. Pt reports she had a fall last night about 2000. Pt was unable to get up out of the floor until EMS arrived this morning when son found the pt lying on the floor. Pt complained of lower back and right hip pain. EXAM: CHEST - 2 VIEW COMPARISON:  09/07/2017 FINDINGS: The heart is mildly enlarged and stable in configuration. The aorta is tortuous and partially calcified. There is prominence of interstitial markings, likely chronic. Focal asymmetric opacities are identified in the RIGHT lung apex, raising question of infiltrate or underlying nodule. Recommend follow-up chest x-ray and possible CT if there is failure of these areas to resolve. Scoliosis, convex LEFT. There are numerous thoracic and UPPER lumbar which compression fractures. The age of these fractures is indeterminate. There is no pneumothorax. No acute displaced rib fractures. IMPRESSION: 1. Cardiomegaly. 2. Stable prominence of interstitial markings. 3. Focal opacity in the RIGHT lung apex possibly representing infiltrate versus pulmonary  nodules. Follow-up is recommended. Recommend follow-up PA and LATERAL chest to determine if these are persistent. Consider CT of the chest if findings persist. Electronically  Signed   By: Norva Pavlov M.D.   On: 11/22/2018 11:03   Ct Head Wo Contrast  Result Date: 11/22/2018 CLINICAL DATA:  Patient fell last evening at 2000 hours. No loss of consciousness. Headache. EXAM: CT HEAD WITHOUT CONTRAST CT CERVICAL SPINE WITHOUT CONTRAST TECHNIQUE: Multidetector CT imaging of the head and cervical spine was performed following the standard protocol without intravenous contrast. Multiplanar CT image reconstructions of the cervical spine were also generated. COMPARISON:  09/12/2017 MRI, head CT 12/11/2003 FINDINGS: CT HEAD FINDINGS Brain: Age related involutional changes the brain with sulcal prominence consistent with superficial atrophy. No hydrocephalus. Mild-to-moderate small vessel ischemic disease of periventricular white matter appears chronic. No large vascular territory infarct, hemorrhage, midline shift or edema. Midline fourth ventricle and basal cisterns without effacement. Brainstem and cerebellum appear intact. No intra-axial mass nor extra-axial fluid collections. Bilateral basal ganglial idiopathic calcifications are noted. Vascular: No hyperdense vessel sign or unexpected calcifications. Moderate atherosclerosis of the carotid siphons. Skull: Intact Sinuses/Orbits: Mild membrane thickening of the ethmoid sinus. The remainder of the paranasal sinuses are unremarkable. No acute ocular abnormality. Other: Clear mastoids. CT CERVICAL SPINE FINDINGS Alignment: Mild grade 1 anterolisthesis of C3 on C4 and C7 on T1 likely degenerative in etiology. Intact atlantodental interval and craniocervical relationship. Skull base and vertebrae: Intact skull base. No acute cervical spine fracture or jumped facets.No suspicious osseous lesions. Soft tissues and spinal canal: No prevertebral fluid or swelling. No  visible canal hematoma. Disc levels: Moderate disc flattening at C2-3 with marked disc flattening from C4 through C7. Small posterior marginal osteophytes are noted from C4 through C7. Bilateral mild facet arthropathy is noted. Mild right-sided C3-4 and bilateral C4-5, C5-6 and C6-7 uncovertebral joint osteoarthritis with uncinate spurring. No significant foraminal encroachment. No significant central canal stenosis. Upper chest: Apical pleuroparenchymal scarring. Subpleural 6 mm nodular opacity at the right lung apex may be postinfectious or inflammatory related scarring. Nodule not entirely excluded. Aortic atherosclerosis at the arch. Other: Extracranial carotid arteriosclerosis. IMPRESSION: 1. Atrophy with chronic appearing small vessel ischemic disease. No acute intracranial abnormality. 2. Cervical spondylosis without acute posttraumatic cervical spine fracture or subluxation. 3. Subpleural 6 mm nodular opacity at the right lung apex may be postinfectious or inflammatory in etiology. Pulmonary nodule not entirely excluded. Non-contrast chest CT at 6-12 months is recommended. If the nodule is stable at time of repeat CT, then future CT at 18-24 months (from today's scan) is considered optional for low-risk patients, but is recommended for high-risk patients. This recommendation follows the consensus statement: Guidelines for Management of Incidental Pulmonary Nodules Detected on CT Images: From the Fleischner Society 2017; Radiology 2017; 284:228-243. Electronically Signed   By: Tollie Eth M.D.   On: 11/22/2018 14:07   Ct Cervical Spine Wo Contrast  Result Date: 11/22/2018 CLINICAL DATA:  Patient fell last evening at 2000 hours. No loss of consciousness. Headache. EXAM: CT HEAD WITHOUT CONTRAST CT CERVICAL SPINE WITHOUT CONTRAST TECHNIQUE: Multidetector CT imaging of the head and cervical spine was performed following the standard protocol without intravenous contrast. Multiplanar CT image reconstructions  of the cervical spine were also generated. COMPARISON:  09/12/2017 MRI, head CT 12/11/2003 FINDINGS: CT HEAD FINDINGS Brain: Age related involutional changes the brain with sulcal prominence consistent with superficial atrophy. No hydrocephalus. Mild-to-moderate small vessel ischemic disease of periventricular white matter appears chronic. No large vascular territory infarct, hemorrhage, midline shift or edema. Midline fourth ventricle and basal cisterns without effacement. Brainstem and cerebellum appear intact. No intra-axial mass nor  extra-axial fluid collections. Bilateral basal ganglial idiopathic calcifications are noted. Vascular: No hyperdense vessel sign or unexpected calcifications. Moderate atherosclerosis of the carotid siphons. Skull: Intact Sinuses/Orbits: Mild membrane thickening of the ethmoid sinus. The remainder of the paranasal sinuses are unremarkable. No acute ocular abnormality. Other: Clear mastoids. CT CERVICAL SPINE FINDINGS Alignment: Mild grade 1 anterolisthesis of C3 on C4 and C7 on T1 likely degenerative in etiology. Intact atlantodental interval and craniocervical relationship. Skull base and vertebrae: Intact skull base. No acute cervical spine fracture or jumped facets.No suspicious osseous lesions. Soft tissues and spinal canal: No prevertebral fluid or swelling. No visible canal hematoma. Disc levels: Moderate disc flattening at C2-3 with marked disc flattening from C4 through C7. Small posterior marginal osteophytes are noted from C4 through C7. Bilateral mild facet arthropathy is noted. Mild right-sided C3-4 and bilateral C4-5, C5-6 and C6-7 uncovertebral joint osteoarthritis with uncinate spurring. No significant foraminal encroachment. No significant central canal stenosis. Upper chest: Apical pleuroparenchymal scarring. Subpleural 6 mm nodular opacity at the right lung apex may be postinfectious or inflammatory related scarring. Nodule not entirely excluded. Aortic  atherosclerosis at the arch. Other: Extracranial carotid arteriosclerosis. IMPRESSION: 1. Atrophy with chronic appearing small vessel ischemic disease. No acute intracranial abnormality. 2. Cervical spondylosis without acute posttraumatic cervical spine fracture or subluxation. 3. Subpleural 6 mm nodular opacity at the right lung apex may be postinfectious or inflammatory in etiology. Pulmonary nodule not entirely excluded. Non-contrast chest CT at 6-12 months is recommended. If the nodule is stable at time of repeat CT, then future CT at 18-24 months (from today's scan) is considered optional for low-risk patients, but is recommended for high-risk patients. This recommendation follows the consensus statement: Guidelines for Management of Incidental Pulmonary Nodules Detected on CT Images: From the Fleischner Society 2017; Radiology 2017; 284:228-243. Electronically Signed   By: Tollie Eth M.D.   On: 11/22/2018 14:07   Ct L-spine No Charge  Result Date: 11/22/2018 CLINICAL DATA:  Low back pain after a fall last night. Initial encounter. EXAM: CT LUMBAR SPINE WITHOUT CONTRAST TECHNIQUE: Multidetector CT imaging of the lumbar spine was performed without intravenous contrast administration. Multiplanar CT image reconstructions were also generated. COMPARISON:  None. FINDINGS: Segmentation: 5 lumbar type vertebrae. Alignment: Thoracolumbar levoscoliosis. Minimal retrolisthesis of T11 on T12 and L1 on L2. 6 mm anterolisthesis of L4 on L5 and 9 mm anterolisthesis of L5 on S1 with a unilateral right-sided L5 pars defect. Vertebrae: L1 inferior endplate compression fracture with superimposed central Schmorl's node resulting in severe vertebral body height loss centrally, favored to be chronic. No suspicious osseous lesion. Diffuse osteopenia. Paraspinal and other soft tissues: Bilateral posterior paraspinal muscle fatty atrophy. Intra-abdominal contents reported separately. Disc levels: Advanced disc space narrowing  at T11-12 and from L2-3 to L5-S1 with degenerative endplate sclerosis, spurring, and vacuum disc at most levels. L1-2: Disc bulging, spurring, and mild L1 inferior endplate retropulsion result in moderate right neural foraminal stenosis without spinal stenosis. L2-3: Disc bulging, endplate spurring, and mild facet and ligamentum flavum hypertrophy result in mild spinal stenosis and mild bilateral neural foraminal stenosis. L3-4: Disc bulging, endplate spurring, and mild facet and ligamentum flavum hypertrophy result in mild spinal stenosis and mild right and moderate left neural foraminal stenosis. L4-5: Anterolisthesis with bulging uncovered disc and facet hypertrophy result in moderate spinal stenosis and moderate right and severe left neural foraminal stenosis. L5-S1: Anterolisthesis with bulging uncovered disc results in moderate right greater than left neural foraminal stenosis and mild spinal stenosis.  IMPRESSION: 1. L1 inferior endplate compression fracture in Schmorl's node deformity, favored to be chronic. 2. Grade 1 anterolisthesis at L4-5 with moderate spinal and moderate to severe neural foraminal stenosis. 3. 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 Electronically Signed   By: Sebastian AcheAllen  Grady M.D.   On: 11/22/2018 14:08   Dg Hip Unilat W Or Wo Pelvis 2-3 Views Right  Result Date: 11/22/2018 CLINICAL DATA:  Pt arrives via [facility] from home. Pt reports she had a fall last night about 2000. Pt was unable to get up out of the floor until EMS arrived this morning when son found the pt lying on the floor. Pt complained of lower back and right hip pain. EXAM: DG HIP (WITH OR WITHOUT PELVIS) 2-3V RIGHT COMPARISON:  06/20/2012 FINDINGS: Bones appear radiolucent. There is no acute fracture or subluxation. Mild degenerative changes are seen in the LOWER lumbar spine. There is atherosclerotic calcification of the femoral arteries.  IMPRESSION: No evidence for acute abnormality. Electronically Signed   By: Norva PavlovElizabeth  Brown M.D.   On: 11/22/2018 11:07   Vas [place] Carotid  Result Date: 11/25/2018 Carotid Arterial Duplex Study Indications:       Syncope. Limitations:       vessels tortuosity Comparison Study:  No available comparison study Performing Technologist: Annamaria HellingOLE, HONGYING  Examination Guidelines: A complete evaluation includes B-mode imaging, spectral Doppler, color Doppler, and power Doppler as needed of all accessible portions of each vessel. Bilateral testing is considered an integral part of a complete examination. Limited examinations for reoccurring indications may be performed as noted.  Right Carotid Findings: +----------+--------+--------+--------+------------------+--------+           PSV cm/sEDV cm/sStenosisDescribe          Comments +----------+--------+--------+--------+------------------+--------+ CCA Prox  74      10                                tortuous +----------+--------+--------+--------+------------------+--------+ CCA Distal41      9               focal and calcific         +----------+--------+--------+--------+------------------+--------+ ICA Prox  47      8       1-39%   focal and calcific         +----------+--------+--------+--------+------------------+--------+ ICA Mid   62      15                                         +----------+--------+--------+--------+------------------+--------+ ICA Distal70      17                                         +----------+--------+--------+--------+------------------+--------+ ECA       66                                                 +----------+--------+--------+--------+------------------+--------+ +----------+--------+-------+--------+-------------------+           PSV cm/sEDV cmsDescribeArm Pressure (mmHG) +----------+--------+-------+--------+-------------------+ [id number]                                          +----------+--------+-------+--------+-------------------+ +---------+--------+--+--------+-+---------+  VertebralPSV cm/s32EDV cm/s8Antegrade +---------+--------+--+--------+-+---------+  Left Carotid Findings: +----------+--------+-------+--------+--------------------------------+--------+           PSV cm/sEDV    StenosisDescribe                        Comments                   cm/s                                                    +----------+--------+-------+--------+--------------------------------+--------+ CCA Prox  49      7                                                       +----------+--------+-------+--------+--------------------------------+--------+ CCA Mid                          diffuse, hyperechoic and                                                  calcific                                 +----------+--------+-------+--------+--------------------------------+--------+ CCA Distal52      10             diffuse and hypoechoic                   +----------+--------+-------+--------+--------------------------------+--------+ ICA Prox  58      17     1-39%   focal and calcific                       +----------+--------+-------+--------+--------------------------------+--------+ ICA Distal51      9                                                       +----------+--------+-------+--------+--------------------------------+--------+ ECA       56                                                              +----------+--------+-------+--------+--------------------------------+--------+ +----------+--------+--------+--------+-------------------+ SubclavianPSV cm/sEDV cm/sDescribeArm Pressure (mmHG) +----------+--------+--------+--------+-------------------+           52                                          +----------+--------+--------+--------+-------------------+  +---------+--------+--+--------+-+---------+ VertebralPSV cm/s52EDV cm/s9Antegrade +---------+--------+--+--------+-+---------+  Summary: Right Carotid: Velocities in the right ICA are consistent with a 1-39% stenosis. Left Carotid: Velocities  in the left ICA are consistent with a 1-39% stenosis. Vertebrals: Bilateral vertebral arteries demonstrate antegrade flow. *See table(s) above for measurements and observations.  Electronically signed by Waverly Ferrari MD on 11/25/2018 at 6:40:54 AM.    Final    Ct Angio Abd/pel W And/or Wo Contrast  Result Date: 11/22/2018 CLINICAL DATA:  fall last night about 2000. Pt reports the fall was mechanical in nature. Pt endorses possible head injury, denies LOC. Denies use blood thinners. Pt was unable to get up out of the floor until EMS arrived this am when son found the pt lying on the floor. Pts only compliant is lumbar pain. Abd mass, pulsatile, AAA suspected. EXAM: CTA ABDOMEN AND PELVIS WITH CONTRAST TECHNIQUE: Multidetector CT imaging of the abdomen and pelvis was performed using the standard protocol during bolus administration of intravenous contrast. Multiplanar reconstructed images and MIPs were obtained and reviewed to evaluate the vascular anatomy. CONTRAST:  ISOVUE-370 IOPAMIDOL (ISOVUE-370) INJECTION 76% COMPARISON:  06/02/2004 FINDINGS: VASCULAR Aorta: Moderate calcified atheromatous plaque. Small penetrating atheromatous ulcer posteriorly in the infrarenal segment without intramural hematoma. No aneurysm, dissection, or stenosis. Celiac: Patent without evidence of aneurysm, dissection, vasculitis or significant stenosis. SMA: Calcified ostial plaque without significant stenosis, patent distally with classic distal branch anatomy. Renals: Single left, widely patent. Single right, with partially calcified ostial plaque resulting in short segment stenosis of at least mild severity, patent distally. IMA: Patent without evidence of aneurysm,  dissection, vasculitis or significant stenosis. Inflow: Moderate tortuosity. Scattered calcified atheromatous plaque. No aneurysm, dissection, or stenosis. Proximal Outflow: Bilateral common femoral and visualized portions of the superficial and profunda femoral arteries are patent without evidence of aneurysm, dissection, vasculitis or significant stenosis. Veins: No obvious venous abnormality within the limitations of this arterial phase study. Review of the MIP images confirms the above findings. NON-VASCULAR Lower chest: Pectus deformity. No pleural or pericardial effusion. Minimal dependent atelectasis in the visualized lung bases. Hepatobiliary: No focal liver abnormality is seen. No gallstones, gallbladder wall thickening, or biliary dilatation. Pancreas: Unremarkable. No pancreatic ductal dilatation or surrounding inflammatory changes. Spleen: Normal in size without focal abnormality. Adrenals/Urinary Tract: Normal adrenals. 1.7 cm upper pole left renal cyst. 2.8 cm right lower pole renal cyst. No hydronephrosis. Urinary bladder physiologically distended. Stomach/Bowel: Stomach and small bowel are nondilated. Appendix surgically absent. Colon is nondilated. Scattered sigmoid diverticula without adjacent inflammatory/edematous change or abscess. Lymphatic: No abdominal or pelvic adenopathy localized. Reproductive: Status post hysterectomy. No adnexal masses. Other: No ascites. No free air. Musculoskeletal: L1 compression deformity with 50% loss of height anteriorly , age indeterminate. Mild thoracolumbar scoliosis. Multilevel spondylitic changes. Grade 1 anterolisthesis L4-5 and L5-S1 probably related to bilateral facet DJD. IMPRESSION: VASCULAR 1. No acute findings. 2. Aortoiliac Atherosclerosis (ICD10-170.0) without aneurysm. 3. Right renal artery ostial stenosis of possible hemodynamic significance. NON-VASCULAR 1. L1 compression deformity, age indeterminate. 2. Sigmoid diverticulosis. Electronically  Signed   By: Corlis Leak M.D.   On: 11/22/2018 14:04    Micro Results   Recent Results (from the past 240 hour(s))  Urine culture     Status: Abnormal   Collection Time: 11/22/18  9:25 AM  Result Value Ref Range Status   Specimen Description   Final    URINE, RANDOM Performed at [facility], [address], [place], [place] [address]    Special Requests   Final    NONE Performed at [facility], [address] [place], [place] [address]    Culture 10,000  COLONIES/mL ESCHERICHIA COLI (A)  Final   Report Status 11/24/2018 FINAL  Final   Organism ID, Bacteria ESCHERICHIA COLI (A)  Final      Susceptibility   Escherichia coli - MIC*    AMPICILLIN >=32 RESISTANT Resistant     CEFAZOLIN <=4 SENSITIVE Sensitive     CEFTRIAXONE <=1 SENSITIVE Sensitive     CIPROFLOXACIN <=0.25 SENSITIVE Sensitive     GENTAMICIN <=1 SENSITIVE Sensitive     IMIPENEM <=0.25 SENSITIVE Sensitive     NITROFURANTOIN <=16 SENSITIVE Sensitive     TRIMETH/SULFA <=20 SENSITIVE Sensitive     AMPICILLIN/SULBACTAM >=32 RESISTANT Resistant     PIP/TAZO <=4 SENSITIVE Sensitive     Extended ESBL NEGATIVE Sensitive     * 10,000 COLONIES/mL ESCHERICHIA COLI       Today   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 rate 18, height 5' (1.524 m), weight 44 kg, SpO2 95 %.   Intake/Output Summary (Last 24 hours) at 11/27/2018 0906 Last data filed at 11/27/2018 0700 Gross per 24 hour  Intake 140 ml  Output -  Net 140 ml    Exam Gen:- Awake Alert,  In no apparent distress  HEENT:- [place].AT, No sclera icterus Neck-Supple Neck,No JVD,.  Lungs-  CTAB , fair symmetrical air movement CV- S1, S2 normal, regular  Abd-  +ve B.Sounds, Abd  Soft, No tenderness, no CVA area tenderness Extremity/Skin:- No  edema, pedal pulses present  Psych-affect is appropriate, oriented x3 Neuro-no new focal deficits, no tremors   Data Review   CBC w Diff:  Lab Results  Component Value Date   WBC 6.7 11/25/2018   HGB 11.2 (L) 11/25/2018   HCT 32.6 (L) 11/25/2018   PLT 179 11/25/2018   LYMPHOPCT 3 11/22/2018   MONOPCT 5 11/22/2018   EOSPCT 0 11/22/2018   BASOPCT 0 11/22/2018    CMP:  Lab Results  Component Value Date   NA 140 11/23/2018   K 4.1 11/23/2018   CL 111 11/23/2018   CO2 21 (L) 11/23/2018   BUN 16 11/23/2018   CREATININE 0.86 11/23/2018   PROT 7.2 11/22/2018   ALBUMIN 4.3 11/22/2018   BILITOT 1.6 (H) 11/22/2018   ALKPHOS 44 11/22/2018   AST 35 11/22/2018   ALT 19 11/22/2018  .   Total Discharge time is about 33 minutes  Shon Hale M.D on 11/27/2018 at 9:06 AM  Pager---[phone]  Go to [email or web address] - password [email or web address] for contact info  [facility] - Office  [phone]

## 2018-12-19 IMAGING — CR DG KNEE COMPLETE 4+V*R*
4 series · 4 of 4 positions shown · non-contrast
Comparison: None.

CLINICAL DATA: Persistent pain after falling this morning

EXAM:
RIGHT KNEE - COMPLETE 4+ VIEW

[knee ap]
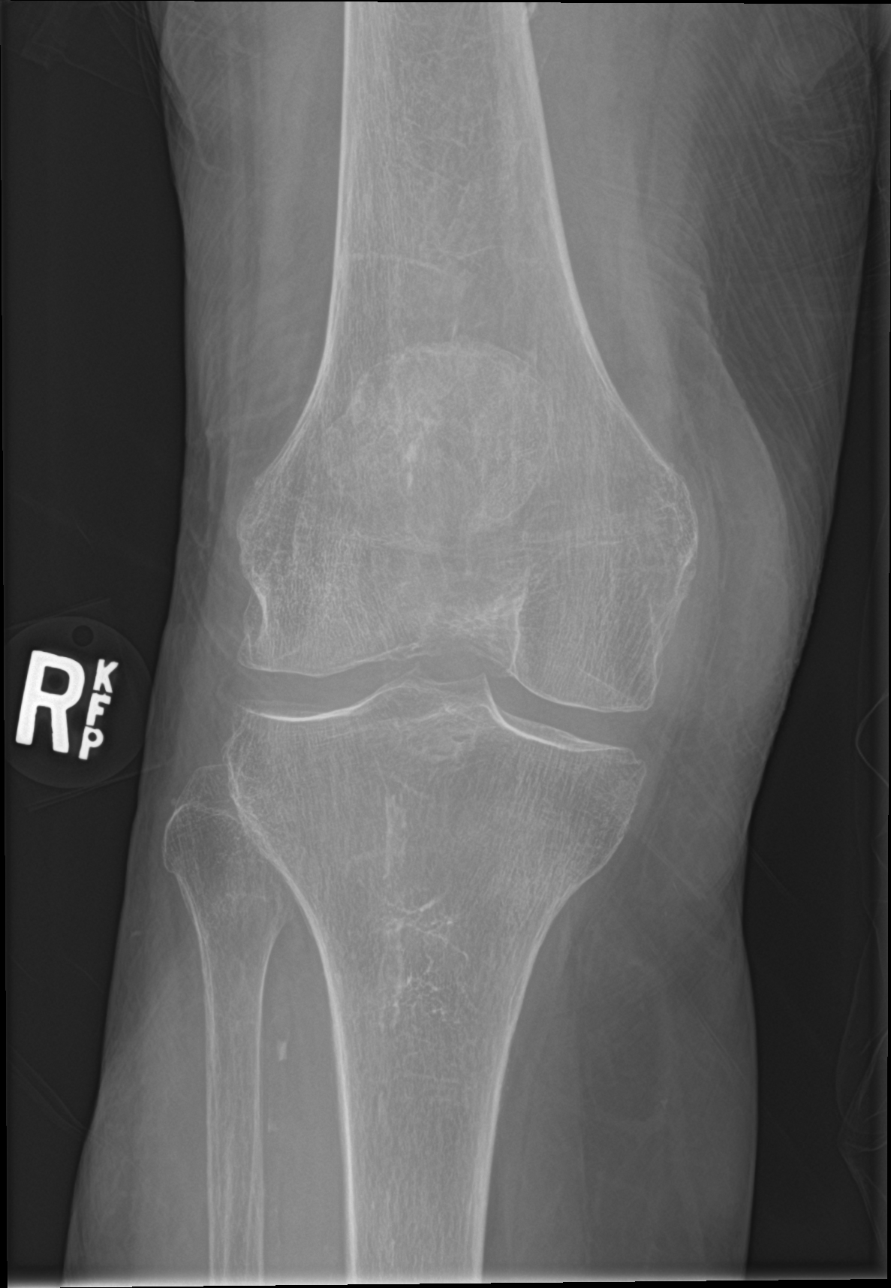

[knee lat]
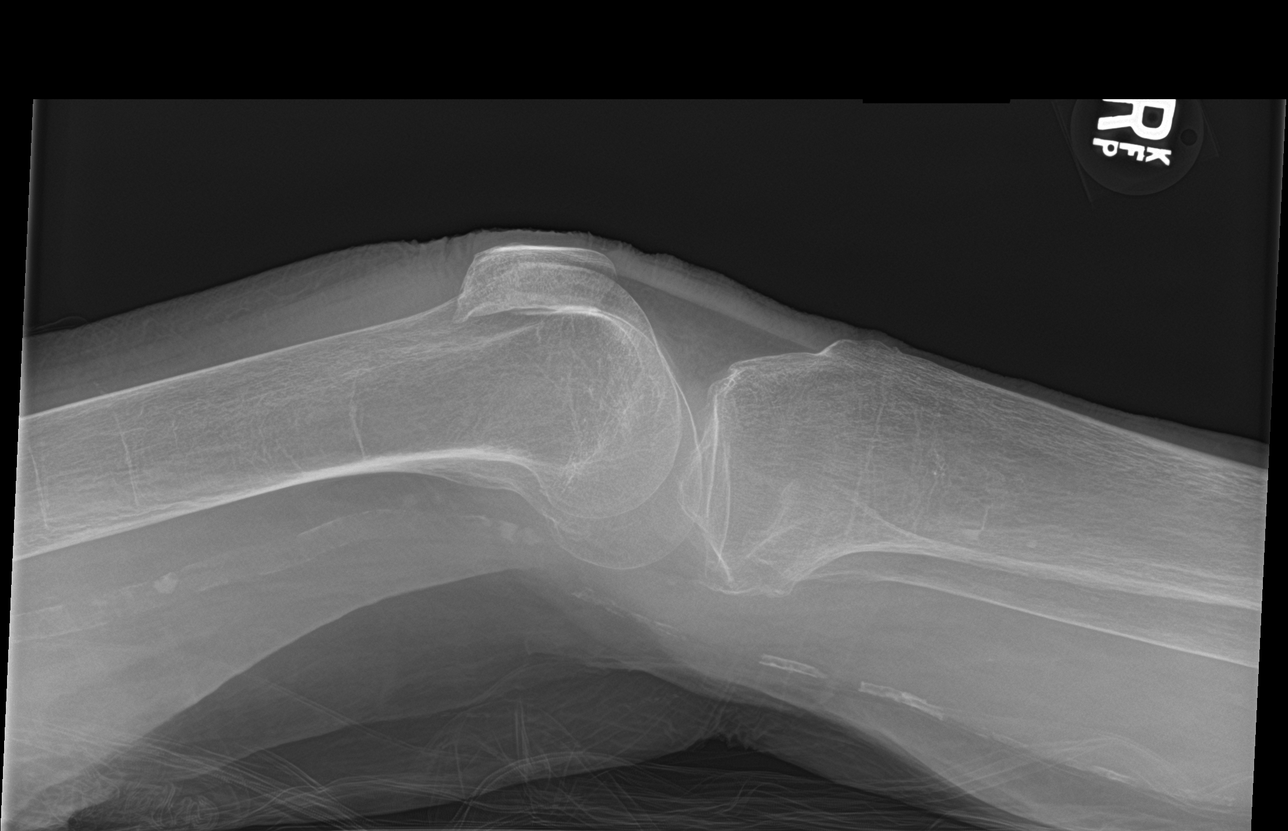

[knee obl (1 of 2)]
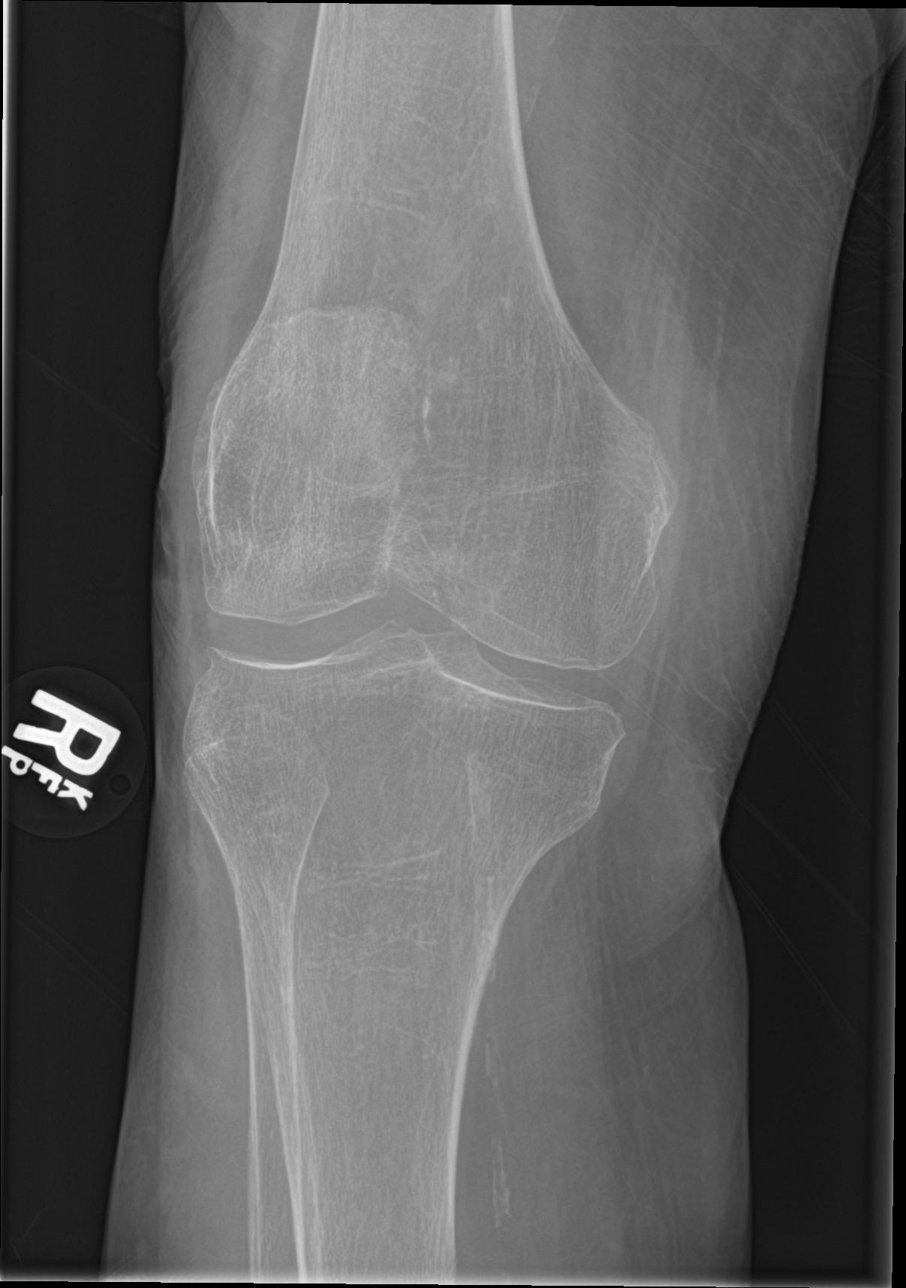

[knee obl (2 of 2)]
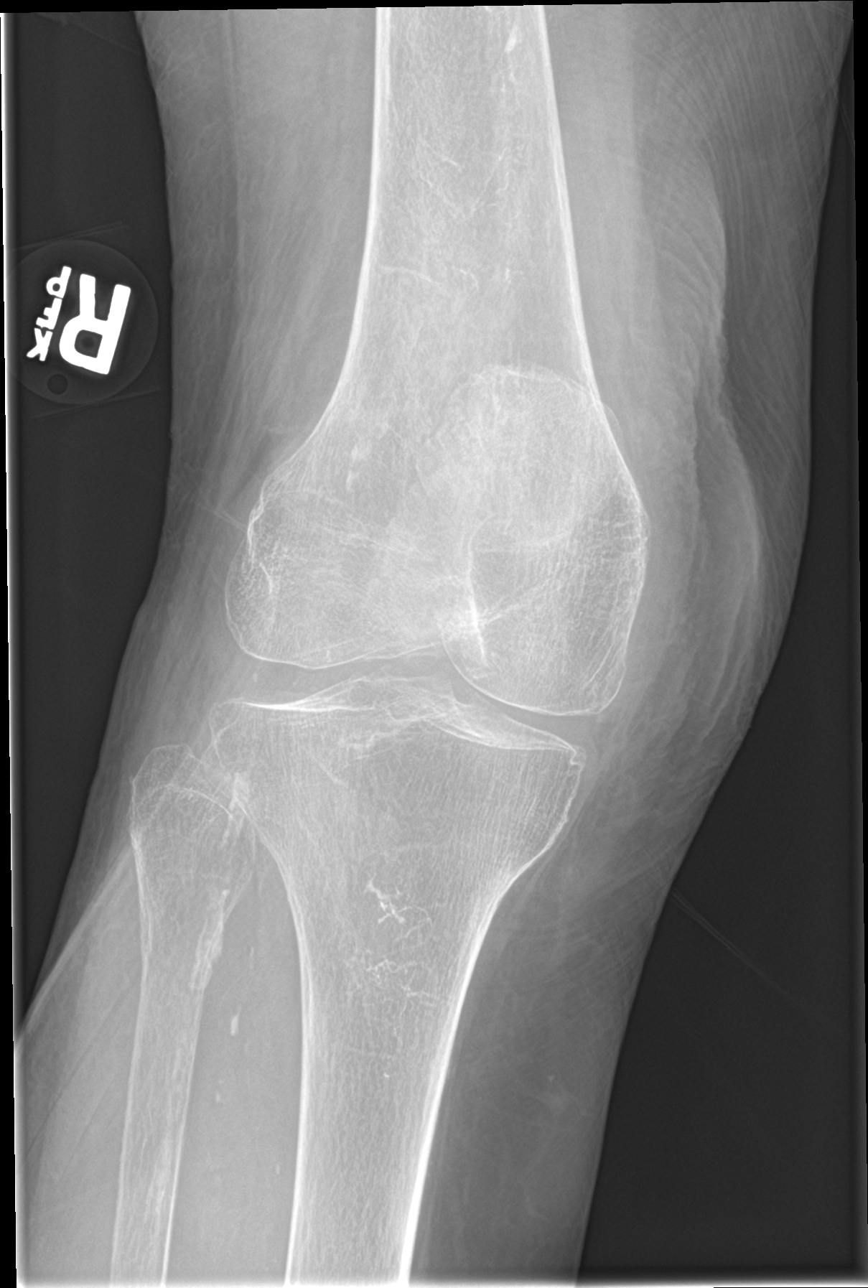

[4 of 4 positions shown; findings below may reference images not displayed]

FINDINGS: Negative for acute fracture or dislocation. No radiographic evidence
of joint effusion. No acute soft tissue abnormality.
IMPRESSION: Negative.

## 2019-12-02 ENCOUNTER — Other Ambulatory Visit: Payer: Self-pay

## 2019-12-02 ENCOUNTER — Encounter (HOSPITAL_COMMUNITY): Payer: Self-pay | Admitting: Emergency Medicine

## 2019-12-02 ENCOUNTER — Emergency Department (HOSPITAL_COMMUNITY)
Admission: EM | Admit: 2019-12-02 | Discharge: 2019-12-02 | Disposition: A | Discharge status: Home / Self Care | Payer: Medicare Other | Attending: Emergency Medicine | Admitting: Emergency Medicine

## 2019-12-02 DIAGNOSIS — N390 Urinary tract infection, site not specified: Secondary | ICD-10-CM | POA: Diagnosis not present

## 2019-12-02 DIAGNOSIS — Z8673 Personal history of transient ischemic attack (TIA), and cerebral infarction without residual deficits: Secondary | ICD-10-CM | POA: Diagnosis not present

## 2019-12-02 DIAGNOSIS — R52 Pain, unspecified: Secondary | ICD-10-CM | POA: Diagnosis present

## 2019-12-02 DIAGNOSIS — E039 Hypothyroidism, unspecified: Secondary | ICD-10-CM | POA: Insufficient documentation

## 2019-12-02 LAB — URINALYSIS, ROUTINE W REFLEX MICROSCOPIC
Bilirubin Urine: NEGATIVE
Glucose, UA: NEGATIVE mg/dL
Ketones, ur: 5 mg/dL — AB
Nitrite: NEGATIVE
Protein, ur: 30 mg/dL — AB
Specific Gravity, Urine: 1.016 (ref 1.005–1.030)
pH: 5 (ref 5.0–8.0)

## 2019-12-02 LAB — CBC WITH DIFFERENTIAL/PLATELET
Abs Immature Granulocytes: 0.02 10*3/uL (ref 0.00–0.07)
Basophils Absolute: 0 10*3/uL (ref 0.0–0.1)
Basophils Relative: 1 %
Eosinophils Absolute: 0.1 10*3/uL (ref 0.0–0.5)
Eosinophils Relative: 2 %
HCT: 35 % — ABNORMAL LOW (ref 36.0–46.0)
Hemoglobin: 11.3 g/dL — ABNORMAL LOW (ref 12.0–15.0)
Immature Granulocytes: 1 %
Lymphocytes Relative: 24 %
Lymphs Abs: 0.9 10*3/uL (ref 0.7–4.0)
MCH: 31.6 pg (ref 26.0–34.0)
MCHC: 32.3 g/dL (ref 30.0–36.0)
MCV: 97.8 fL (ref 80.0–100.0)
Monocytes Absolute: 0.5 10*3/uL (ref 0.1–1.0)
Monocytes Relative: 12 %
Neutro Abs: 2.3 10*3/uL (ref 1.7–7.7)
Neutrophils Relative %: 60 %
Platelets: 204 10*3/uL (ref 150–400)
RBC: 3.58 MIL/uL — ABNORMAL LOW (ref 3.87–5.11)
RDW: 16.1 % — ABNORMAL HIGH (ref 11.5–15.5)
WBC: 3.7 10*3/uL — ABNORMAL LOW (ref 4.0–10.5)
nRBC: 0 % (ref 0.0–0.2)

## 2019-12-02 LAB — COMPREHENSIVE METABOLIC PANEL
ALT: 15 U/L (ref 0–44)
AST: 28 U/L (ref 15–41)
Albumin: 4.2 g/dL (ref 3.5–5.0)
Alkaline Phosphatase: 68 U/L (ref 38–126)
Anion gap: 11 (ref 5–15)
BUN: 19 mg/dL (ref 8–23)
CO2: 24 mmol/L (ref 22–32)
Calcium: 8.8 mg/dL — ABNORMAL LOW (ref 8.9–10.3)
Chloride: 102 mmol/L (ref 98–111)
Creatinine, Ser: 0.72 mg/dL (ref 0.44–1.00)
GFR calc Af Amer: >60 mL/min (ref >60)
GFR calc non Af Amer: >60 mL/min (ref >60)
Glucose, Bld: 87 mg/dL (ref 70–99)
Potassium: 3.8 mmol/L (ref 3.5–5.1)
Sodium: 137 mmol/L (ref 135–145)
Total Bilirubin: 0.8 mg/dL (ref 0.3–1.2)
Total Protein: 7.3 g/dL (ref 6.5–8.1)

## 2019-12-02 MED ORDER — CEPHALEXIN 500 MG PO CAPS: 500.0000 mg | ORAL_CAPSULE | Freq: Three times a day (TID) | ORAL | 0 refills | Status: DC

## 2019-12-02 NOTE — ED Notes (Signed)
Spoke with daughter, she is coming to pick up her mother and will call when she arrives.

## 2019-12-02 NOTE — ED Provider Notes (Signed)
[facility] Provider Note   CSN: [id number] Arrival date & time: 12/02/19  5573     History Chief Complaint  Patient presents with  . generalized pain  . painful urination    Tara Nolan is a 86 y.o. female.  86 year old female who presents complaining of diffuse whole body pain which has been chronic in nature.  Took Tylenol for this prior to arrival.  She also notes that she has had some dysuria as well as frequency.  Denies any fever or abdominal discomfort.  No flank pain.  No emesis.  No cough congestion.  Denies any dyspnea.  Cannot take opiates because of side effects.  Presents via EMS.  Denies any recent history of falls.        Past Medical History:  Diagnosis Date  . Stroke (HCC)   . Thyroid disease     Patient Active Problem List   Diagnosis Date Noted  . Syncope and collapse 11/22/2018  . Back pain 11/22/2018  . Hypothyroid 11/22/2018  . History of CVA (cerebrovascular accident) 09/12/2017  . Fall 09/08/2017  . Right shoulder pain 09/08/2017  . Left wrist fracture 09/08/2017  . History of thyroid disease 09/08/2017  . Chest discomfort 09/08/2017  . Hip pain, acute 06/20/2012  . Knee pain 06/20/2012  . Hamstring tendon rupture 06/20/2012    Past Surgical History:  Procedure Laterality Date  . ABDOMINAL HYSTERECTOMY    . APPENDECTOMY    . HERNIA REPAIR    . THYROIDECTOMY       OB History   No obstetric history on file.     No family history on file.  Social History   Tobacco Use  . Smoking status: Never Smoker  . Smokeless tobacco: Never Used  Substance Use Topics  . Alcohol use: No  . Drug use: No    Home Medications Prior to Admission medications   Medication Sig Start Date End Date Taking? Authorizing Provider  amLODipine (NORVASC) 2.5 MG tablet Take 1 tablet (2.5 mg total) by mouth daily. 11/27/18   Shon Hale, MD  HYDROcodone-acetaminophen (NORCO) 5-325 MG tablet Take 1 tablet by  mouth every 6 (six) hours as needed for moderate pain or severe pain. 11/27/18   Shon Hale, MD  levothyroxine (SYNTHROID, LEVOTHROID) 25 MCG tablet Take 1 tablet (25 mcg total) by mouth daily before breakfast. 11/27/18   Mariea Clonts, Cour[place], MD  methocarbamol (ROBAXIN) 500 MG tablet Take 1 tablet (500 mg total) by mouth 2 (two) times daily. 11/27/18   Shon Hale, MD    Allergies    Other  Review of Systems   Review of Systems  All other systems reviewed and are negative.   Physical Exam Updated Vital Signs BP (!) 190/100 (BP Location: Left Arm)   Pulse 87   Temp 97.8 F (36.6 C) (Oral)   Resp 13   Ht 1.6 m (5\' 3" )   Wt 43.1 kg   SpO2 98%   BMI 16.83 kg/m   Physical Exam Vitals and nursing note reviewed.  Constitutional:      General: She is not in acute distress.    Appearance: Normal appearance. She is well-developed. She is not toxic-appearing.  HENT:     Head: Normocephalic and atraumatic.  Eyes:     General: Lids are normal.     Conjunctiva/sclera: Conjunctivae normal.     Pupils: Pupils are equal, round, and reactive to light.  Neck:     Thyroid: No thyroid mass.  Trachea: No tracheal deviation.  Cardiovascular:     Rate and Rhythm: Normal rate and regular rhythm.     Heart sounds: Normal heart sounds. No murmur. No gallop.   Pulmonary:     Effort: Pulmonary effort is normal. No respiratory distress.     Breath sounds: Normal breath sounds. No stridor. No decreased breath sounds, wheezing, rhonchi or rales.  Abdominal:     General: Bowel sounds are normal. There is no distension.     Palpations: Abdomen is soft.     Tenderness: There is no abdominal tenderness. There is no rebound.  Musculoskeletal:        General: No tenderness. Normal range of motion.     Cervical back: Normal range of motion and neck supple.  Skin:    General: Skin is warm and dry.     Findings: No abrasion or rash.  Neurological:     Mental Status: She is alert and oriented to  person, place, and time.     GCS: GCS eye subscore is 4. GCS verbal subscore is 5. GCS motor subscore is 6.     Cranial Nerves: No cranial nerve deficit.     Sensory: No sensory deficit.  Psychiatric:        Speech: Speech normal.        Behavior: Behavior normal.     ED Results / Procedures / Treatments   Labs (all labs ordered are listed, but only abnormal results are displayed) Labs Reviewed  URINE CULTURE  URINALYSIS, ROUTINE W REFLEX MICROSCOPIC  CBC WITH DIFFERENTIAL/PLATELET  COMPREHENSIVE METABOLIC PANEL    EKG None  Radiology No results found.  Procedures Procedures (including critical care time)  Medications Ordered in ED Medications - No data to display  ED Course  I have reviewed the triage vital signs and the nursing notes.  Pertinent labs & imaging results that were available during my care of the patient were reviewed by me and considered in my medical decision making (see chart for details).    MDM Rules/Calculators/A&P                     Urine culture sent Patient to be treated for her UTI with Keflex and return precautions given. Final Clinical Impression(s) / ED Diagnoses Final diagnoses:  None    Rx / DC Orders ED Discharge Orders    None       Lacretia Leigh, MD 12/02/19 1003

## 2019-12-02 NOTE — ED Triage Notes (Signed)
Arrives via EMS from home, alert and oriented, complaining of generalized pain x2 weeks, and painful urination.

## 2019-12-02 NOTE — ED Notes (Signed)
Tara Nolan daughter [phone] would like a phone call with an update when the tests come back.

## 2019-12-04 LAB — URINE CULTURE: Culture: 50000 — AB

## 2019-12-05 ENCOUNTER — Telehealth: Payer: Self-pay | Admitting: *Deleted

## 2019-12-05 NOTE — Telephone Encounter (Signed)
Post ED Visit - Positive Culture Follow-up  Culture report reviewed by antimicrobial stewardship pharmacist: [facility] []  , Pharm.D. []  Enzo Bi, Pharm.D., BCPS AQ-ID []  , Pharm.D., BCPS []  Celedonio Miyamoto, .D., BCPS []  [place], .D., BCPS, AAHIVP []  Georgina Pillion, Pharm.D., BCPS, AAHIVP []  [address], PharmD, BCPS []  , PharmD, BCPS [x]  [place], PharmD, BCPS []  [place], PharmD []  , PharmD, BCPS []  Estella Husk, PharmD  Pharmacy Team []  Lysle Pearl, PharmD []  , PharmD []  Phillips Climes, PharmD []  , Rph []  Agapito Games) , PharmD []  Verlan Friends, PharmD []  , PharmD []  Mervyn Gay, PharmD []  , PharmD []  Vinnie Level, PharmD []  [facility], PharmD []  , PharmD []  Len Childs, PharmD   Positive urine culture Treated with Cephalexin, organism sensitive to the same and no further patient follow-up is required at this time.  [facility] 12/05/2019, 12:53 PM

## 2019-12-08 ENCOUNTER — Emergency Department (HOSPITAL_COMMUNITY): Payer: Medicare Other

## 2019-12-08 ENCOUNTER — Emergency Department (HOSPITAL_COMMUNITY)
Admission: EM | Admit: 2019-12-08 | Discharge: 2019-12-08 | Disposition: A | Discharge status: Home / Self Care | Payer: Medicare Other | Attending: Emergency Medicine | Admitting: Emergency Medicine

## 2019-12-08 ENCOUNTER — Encounter (HOSPITAL_COMMUNITY): Payer: Self-pay

## 2019-12-08 DIAGNOSIS — Z79899 Other long term (current) drug therapy: Secondary | ICD-10-CM | POA: Insufficient documentation

## 2019-12-08 DIAGNOSIS — R1031 Right lower quadrant pain: Secondary | ICD-10-CM | POA: Diagnosis present

## 2019-12-08 DIAGNOSIS — R52 Pain, unspecified: Secondary | ICD-10-CM

## 2019-12-08 DIAGNOSIS — E890 Postprocedural hypothyroidism: Secondary | ICD-10-CM | POA: Diagnosis not present

## 2019-12-08 DIAGNOSIS — R1084 Generalized abdominal pain: Secondary | ICD-10-CM | POA: Diagnosis not present

## 2019-12-08 LAB — CBC WITH DIFFERENTIAL/PLATELET
Abs Immature Granulocytes: 0.04 10*3/uL (ref 0.00–0.07)
Basophils Absolute: 0.1 10*3/uL (ref 0.0–0.1)
Basophils Relative: 1 %
Eosinophils Absolute: 0.2 10*3/uL (ref 0.0–0.5)
Eosinophils Relative: 2 %
HCT: 39.2 % (ref 36.0–46.0)
Hemoglobin: 12.7 g/dL (ref 12.0–15.0)
Immature Granulocytes: 1 %
Lymphocytes Relative: 20 %
Lymphs Abs: 1.4 10*3/uL (ref 0.7–4.0)
MCH: 31.8 pg (ref 26.0–34.0)
MCHC: 32.4 g/dL (ref 30.0–36.0)
MCV: 98.2 fL (ref 80.0–100.0)
Monocytes Absolute: 0.5 10*3/uL (ref 0.1–1.0)
Monocytes Relative: 7 %
Neutro Abs: 4.8 10*3/uL (ref 1.7–7.7)
Neutrophils Relative %: 69 %
Platelets: 241 10*3/uL (ref 150–400)
RBC: 3.99 MIL/uL (ref 3.87–5.11)
RDW: 16.2 % — ABNORMAL HIGH (ref 11.5–15.5)
WBC: 6.9 10*3/uL (ref 4.0–10.5)
nRBC: 0 % (ref 0.0–0.2)

## 2019-12-08 LAB — COMPREHENSIVE METABOLIC PANEL
ALT: 13 U/L (ref 0–44)
AST: 24 U/L (ref 15–41)
Albumin: 4.3 g/dL (ref 3.5–5.0)
Alkaline Phosphatase: 79 U/L (ref 38–126)
Anion gap: 10 (ref 5–15)
BUN: 15 mg/dL (ref 8–23)
CO2: 25 mmol/L (ref 22–32)
Calcium: 8.8 mg/dL — ABNORMAL LOW (ref 8.9–10.3)
Chloride: 104 mmol/L (ref 98–111)
Creatinine, Ser: 0.7 mg/dL (ref 0.44–1.00)
GFR calc Af Amer: >60 mL/min (ref >60)
GFR calc non Af Amer: >60 mL/min (ref >60)
Glucose, Bld: 80 mg/dL (ref 70–99)
Potassium: 3.8 mmol/L (ref 3.5–5.1)
Sodium: 139 mmol/L (ref 135–145)
Total Bilirubin: 0.9 mg/dL (ref 0.3–1.2)
Total Protein: 7.8 g/dL (ref 6.5–8.1)

## 2019-12-08 LAB — LIPASE, BLOOD: Lipase: 40 U/L (ref 11–51)

## 2019-12-08 MED ORDER — HYDROCODONE-ACETAMINOPHEN 5-325 MG PO TABS: 0.5000 | ORAL_TABLET | Freq: Four times a day (QID) | ORAL | 0 refills | Status: DC | PRN

## 2019-12-08 MED ORDER — ACETAMINOPHEN 500 MG PO TABS
500.0000 mg | ORAL_TABLET | Freq: Once | ORAL | Status: AC
  Administered 2019-12-08: 500 mg via ORAL
  Filled 2019-12-08: qty 1

## 2019-12-08 NOTE — ED Notes (Signed)
Patient transported to CT 

## 2019-12-08 NOTE — ED Provider Notes (Signed)
[facility] DEPT Provider Note   CSN: [id number] Arrival date & time: 12/08/19  4098     History Chief Complaint  Patient presents with  . Flank Pain  . Generalized Body Aches    Tara Nolan is a 86 y.o. female.  Presents to ER with complaint of generalized pain, flank pain.  Patient states diagnosed with UTI, on cephalexin.  States that she has chronic generalized body pain.  No change in this chronic pain.  Takes tramadol at home.  Still having pain.  No vomiting, no fever.  Chart review, recent ER visit 1/9, diagnosed with UTI, urine culture showed E. coli with sensitivity to cephalexin.  HPI     Past Medical History:  Diagnosis Date  . Stroke ([place])   . Thyroid disease     Patient Active Problem List   Diagnosis Date Noted  . Syncope and collapse 11/22/2018  . Back pain 11/22/2018  . Hypothyroid 11/22/2018  . History of CVA (cerebrovascular accident) 09/12/2017  . Fall 09/08/2017  . Right shoulder pain 09/08/2017  . Left wrist fracture 09/08/2017  . History of thyroid disease 09/08/2017  . Chest discomfort 09/08/2017  . Hip pain, acute 06/20/2012  . Knee pain 06/20/2012  . Hamstring tendon rupture 06/20/2012    Past Surgical History:  Procedure Laterality Date  . ABDOMINAL HYSTERECTOMY    . APPENDECTOMY    . HERNIA REPAIR    . THYROIDECTOMY       OB History   No obstetric history on file.     History reviewed. No pertinent family history.  Social History   Tobacco Use  . Smoking status: Never Smoker  . Smokeless tobacco: Never Used  Substance Use Topics  . Alcohol use: No  . Drug use: No    Home Medications Prior to Admission medications   Medication Sig Start Date End Date Taking? Authorizing Provider  traMADol (ULTRAM) 50 MG tablet Take 50 mg by mouth every 6 (six) hours as needed. 12/04/19 12/14/19 Yes [provider]  acetaminophen (TYLENOL) 500 MG tablet Take 250 mg by mouth every 6 (six)  hours as needed for mild pain.    [provider]  amLODipine (NORVASC) 2.5 MG tablet Take 1 tablet (2.5 mg total) by mouth daily. Patient not taking: Reported on 12/02/2019 11/27/18   Roxan Hockey, MD  cephALEXin (KEFLEX) 500 MG capsule Take 1 capsule (500 mg total) by mouth 3 (three) times daily. 12/02/19   Lacretia Leigh, MD  docusate sodium (COLACE) 100 MG capsule Take 100 mg by mouth at bedtime.    [provider]  HYDROcodone-acetaminophen (NORCO) 5-325 MG tablet Take 1 tablet by mouth every 6 (six) hours as needed for moderate pain or severe pain. Patient not taking: Reported on 12/02/2019 11/27/18   Roxan Hockey, MD  levothyroxine (SYNTHROID, LEVOTHROID) 25 MCG tablet Take 1 tablet (25 mcg total) by mouth daily before breakfast. Patient taking differently: Take 12.5 mcg by mouth daily before breakfast.  11/27/18   Roxan Hockey, MD  meloxicam (MOBIC) 15 MG tablet Take 15 mg by mouth daily as needed for pain.    [provider]  methocarbamol (ROBAXIN) 500 MG tablet Take 1 tablet (500 mg total) by mouth 2 (two) times daily. Patient not taking: Reported on 12/02/2019 11/27/18   Roxan Hockey, MD  mirtazapine (REMERON) 15 MG tablet Take 15 mg by mouth daily as needed.    [provider]    Allergies    Other  Review of Systems   Review of Systems  Constitutional: Positive for fatigue. Negative for chills and fever.  HENT: Negative for ear pain and sore throat.   Eyes: Negative for pain and visual disturbance.  Respiratory: Negative for cough and shortness of breath.   Cardiovascular: Negative for chest pain and palpitations.  Gastrointestinal: Negative for abdominal pain and vomiting.  Genitourinary: Positive for flank pain. Negative for dysuria and hematuria.  Musculoskeletal: Negative for arthralgias and back pain.  Skin: Negative for color change and rash.  Neurological: Negative for seizures and syncope.  All other systems reviewed and are  negative.   Physical Exam Updated Vital Signs BP (!) 199/114 (BP Location: Right Arm)   Pulse 87   Temp 98.7 F (37.1 C) (Oral)   Resp 16   SpO2 99%   Physical Exam Vitals and nursing note reviewed.  Constitutional:      General: She is not in acute distress.    Appearance: She is well-developed.  HENT:     Head: Normocephalic and atraumatic.  Eyes:     Conjunctiva/sclera: Conjunctivae normal.  Cardiovascular:     Rate and Rhythm: Normal rate and regular rhythm.     Heart sounds: No murmur.  Pulmonary:     Effort: Pulmonary effort is normal. No respiratory distress.     Breath sounds: Normal breath sounds.  Abdominal:     Palpations: Abdomen is soft.     Comments: Some tenderness in right flank but otherwise soft nontender abdomen  Musculoskeletal:        General: No swelling or tenderness.     Cervical back: Neck supple.  Skin:    General: Skin is warm and dry.     Capillary Refill: Capillary refill takes less than 2 seconds.  Neurological:     General: No focal deficit present.     Mental Status: She is alert.  Psychiatric:        Mood and Affect: Mood normal.     ED Results / Procedures / Treatments   Labs (all labs ordered are listed, but only abnormal results are displayed) Labs Reviewed  URINE CULTURE  CBC WITH DIFFERENTIAL/PLATELET  COMPREHENSIVE METABOLIC PANEL  LIPASE, BLOOD  URINALYSIS, ROUTINE W REFLEX MICROSCOPIC    EKG None  Radiology No results found.  Procedures Procedures (including critical care time)  Medications Ordered in ED Medications  acetaminophen (TYLENOL) tablet 500 mg (has no administration in time range)    ED Course  I have reviewed the triage vital signs and the nursing notes.  Pertinent labs & imaging results that were available during my care of the patient were reviewed by me and considered in my medical decision making (see chart for details).  Clinical Course as of Dec 07 1098  Tue Dec 08, 2019  1045  Assessed patient   [RD]  41 Called daughter, no answer, left VM to call back   [RD]    Clinical Course User Index [RD] Milagros Loll, MD   MDM Rules/Calculators/A&P                      86 year old lady who presents to ER with complaints of generalized pain, worse in right side.  Recent diagnosis with UTI.  CT ordered to evaluate for kidney stone, other acute process.  No acute findings on CT, demonstrated chronic compression fracture.  Radiologist noted hyperdense collection around right greater trochanter which could be hematoma or bursal fluid.  There is no external  sign of trauma, no report of falls.  Normal ROM, doubt significant acute trauma or acute infectious process here.  Labs within normal limits today.  Patient is well-appearing.  Suspect symptoms today are more chronic in nature.  Her cephalexin has good sensitivities to the urine culture that showed E. coli.  Will give Rx for hydrocodone, recommend recheck with primary doctor.  Reviewed case with patient's daughter and son. Will dc.     After the discussed management above, the patient was determined to be safe for discharge.  The patient was in agreement with this plan and all questions regarding their care were answered.  ED return precautions were discussed and the patient will return to the ED with any significant worsening of condition.   Final Clinical Impression(s) / ED Diagnoses Final diagnoses:  Generalized pain    Rx / DC Orders ED Discharge Orders    None       Milagros Loll, MD 12/08/19 1400

## 2019-12-08 NOTE — ED Triage Notes (Addendum)
Pt brought in by family. Pt c/o generalized body pain , but mostly right flank area. Per family, pt was given keflex and tramadol . Pt has been tramadol, which puts her to sleep, so they are hesitant to give her pain meds during the day. Pt has been taking keflex as prescribed, but pain has gotten worse. Pt A&ox 4  Daughter, Tara Nolan can be reached at [phone]

## 2019-12-08 NOTE — Discharge Instructions (Addendum)
Recommend taking Percocet as needed.  Recommend return to ER for worsening pain, fever, vomiting.

## 2019-12-26 ENCOUNTER — Emergency Department (HOSPITAL_COMMUNITY): Payer: Medicare Other

## 2019-12-26 ENCOUNTER — Other Ambulatory Visit: Payer: Self-pay

## 2019-12-26 ENCOUNTER — Emergency Department (HOSPITAL_COMMUNITY)
Admission: EM | Admit: 2019-12-26 | Discharge: 2019-12-26 | Disposition: A | Discharge status: Home / Self Care | Payer: Medicare Other | Attending: Emergency Medicine | Admitting: Emergency Medicine

## 2019-12-26 DIAGNOSIS — W19XXXA Unspecified fall, initial encounter: Secondary | ICD-10-CM

## 2019-12-26 DIAGNOSIS — R42 Dizziness and giddiness: Secondary | ICD-10-CM | POA: Insufficient documentation

## 2019-12-26 DIAGNOSIS — G8929 Other chronic pain: Secondary | ICD-10-CM | POA: Diagnosis not present

## 2019-12-26 DIAGNOSIS — M545 Low back pain, unspecified: Secondary | ICD-10-CM

## 2019-12-26 DIAGNOSIS — E039 Hypothyroidism, unspecified: Secondary | ICD-10-CM | POA: Insufficient documentation

## 2019-12-26 MED ORDER — HYDROCODONE-ACETAMINOPHEN 5-325 MG PO TABS
1.0000 | ORAL_TABLET | Freq: Once | ORAL | Status: AC
  Administered 2019-12-26: 1 via ORAL
  Filled 2019-12-26: qty 1

## 2019-12-26 MED ORDER — HYDROCODONE-ACETAMINOPHEN 5-325 MG PO TABS: 0.5000 | ORAL_TABLET | Freq: Three times a day (TID) | ORAL | 0 refills | Status: DC | PRN

## 2019-12-26 NOTE — ED Provider Notes (Addendum)
[facility]-EMERGENCY DEPT Provider Note   CSN: [id number] Arrival date & time: 12/26/19  6767     History Chief Complaint  Patient presents with  . Fall  . Hip Pain  . Back Pain    DEDEE Nolan is a 86 y.o. female.  HPI Patient presents to the emergency department with injuries following a fall.  Patient states she has chronic pain in her hip and back and states that she hurts all over most of the time.  Patient states that she has episodes when she stands up where she gets dizzy and will fall.  She states that happened today and she was able to grab onto the walker before she fell onto the ground.  Patient states that she does not think she lost consciousness.  The patient denies chest pain, shortness of breath, headache,blurred vision, neck pain, fever, cough, weakness, numbness, dizziness, anorexia, edema, abdominal pain, nausea, vomiting, diarrhea, rash, back pain, dysuria, hematemesis, bloody stool, near syncope, or syncope.    Past Medical History:  Diagnosis Date  . Stroke (HCC)   . Thyroid disease     Patient Active Problem List   Diagnosis Date Noted  . Syncope and collapse 11/22/2018  . Back pain 11/22/2018  . Hypothyroid 11/22/2018  . History of CVA (cerebrovascular accident) 09/12/2017  . Fall 09/08/2017  . Right shoulder pain 09/08/2017  . Left wrist fracture 09/08/2017  . History of thyroid disease 09/08/2017  . Chest discomfort 09/08/2017  . Hip pain, acute 06/20/2012  . Knee pain 06/20/2012  . Hamstring tendon rupture 06/20/2012    Past Surgical History:  Procedure Laterality Date  . ABDOMINAL HYSTERECTOMY    . APPENDECTOMY    . HERNIA REPAIR    . THYROIDECTOMY       OB History   No obstetric history on file.     No family history on file.  Social History   Tobacco Use  . Smoking status: Never Smoker  . Smokeless tobacco: Never Used  Substance Use Topics  . Alcohol use: No  . Drug use: No    Home  Medications Prior to Admission medications   Medication Sig Start Date End Date Taking? Authorizing Provider  acetaminophen (TYLENOL) 500 MG tablet Take 250 mg by mouth every 6 (six) hours as needed for mild pain.   Yes [provider]  amLODipine (NORVASC) 2.5 MG tablet Take 1 tablet (2.5 mg total) by mouth daily. Patient not taking: Reported on 12/02/2019 11/27/18   Shon Hale, MD  cephALEXin (KEFLEX) 500 MG capsule Take 1 capsule (500 mg total) by mouth 3 (three) times daily. Patient not taking: Reported on 12/26/2019 12/02/19   Lorre Nick, MD  HYDROcodone-acetaminophen [facility]) 5-325 MG tablet Take 0.5 tablets by mouth every 6 (six) hours as needed for moderate pain or severe pain. Patient not taking: Reported on 12/26/2019 12/08/19   Milagros Loll, MD  levothyroxine (SYNTHROID, LEVOTHROID) 25 MCG tablet Take 1 tablet (25 mcg total) by mouth daily before breakfast. Patient not taking: Reported on 12/08/2019 11/27/18   Shon Hale, MD  methocarbamol (ROBAXIN) 500 MG tablet Take 1 tablet (500 mg total) by mouth 2 (two) times daily. Patient not taking: Reported on 12/02/2019 11/27/18   Shon Hale, MD    Allergies    Other  Review of Systems   Review of Systems All other systems negative except as documented in the HPI. All pertinent positives and negatives as reviewed in the HPI. Physical Exam Updated Vital  Signs BP (!) 170/96 (BP Location: Right Arm)   Pulse 83   Temp 98 F (36.7 C) (Oral)   Resp 16   SpO2 97%   Physical Exam Vitals and nursing note reviewed.  Constitutional:      General: She is not in acute distress.    Appearance: She is well-developed.  HENT:     Head: Normocephalic and atraumatic.  Eyes:     Pupils: Pupils are equal, round, and reactive to light.  Cardiovascular:     Rate and Rhythm: Normal rate and regular rhythm.     Heart sounds: Normal heart sounds. No murmur. No friction rub. No gallop.   Pulmonary:     Effort: Pulmonary  effort is normal. No respiratory distress.     Breath sounds: Normal breath sounds. No wheezing.  Abdominal:     General: Bowel sounds are normal. There is no distension.     Palpations: Abdomen is soft.     Tenderness: There is no abdominal tenderness.  Musculoskeletal:     Cervical back: Normal range of motion and neck supple.     Left hip: No deformity.       Legs:  Skin:    General: Skin is warm and dry.     Capillary Refill: Capillary refill takes less than 2 seconds.     Findings: No erythema or rash.  Neurological:     Mental Status: She is alert and oriented to person, place, and time.     Motor: No abnormal muscle tone.     Coordination: Coordination normal.  Psychiatric:        Behavior: Behavior normal.     ED Results / Procedures / Treatments   Labs (all labs ordered are listed, but only abnormal results are displayed) Labs Reviewed - No data to display  EKG None  Radiology DG Lumbar Spine Complete  Result Date: 12/26/2019 CLINICAL DATA:  Fall with low back pain. EXAM: LUMBAR SPINE - COMPLETE 4+ VIEW COMPARISON:  CT lumbar spine 11/22/2018 and CT abdomen/pelvis 12/08/2019 FINDINGS: Mild curvature of the lumbar spine convex right. Diffuse osteopenia. There is mild to moderate spondylosis of the lumbar spine to include facet arthropathy. Moderate disc space narrowing at the L3-4, L4-5 and L5-S1 levels. Mild disc space narrowing at the L2-3 level. Stable grade 1 anterolisthesis of L4 on L5 and stable grade 1-2 anterolisthesis of L5 on S1. Stable moderate L1 compression fracture. Stable compression fracture of T9 and T10. IMPRESSION: 1.  No acute findings. 2. Stable moderate L1 compression fracture. Stable moderate T9 and T10 compression fractures. 3. Mild spondylosis with moderate disc disease unchanged from the L2-3 level to the L5-S1 level. Stable grade 1-2 anterolisthesis of L5 on S1 and grade 1 anterolisthesis of L4 on L5 likely due to the moderate facet arthropathy.  Electronically Signed   By: Elberta Fortis M.D.   On: 12/26/2019 12:31   CT Head Wo Contrast  Result Date: 12/26/2019 CLINICAL DATA:  Fall from standing height. EXAM: CT HEAD WITHOUT CONTRAST TECHNIQUE: Contiguous axial images were obtained from the base of the skull through the vertex without intravenous contrast. COMPARISON:  11/22/2018 FINDINGS: Brain: Ventricles and cisterns are normal. Mild stable prominence of the CSF spaces compatible with age related atrophic change. No mass, mass effect, shift of midline structures or acute hemorrhage. Mild basal ganglia calcifications are present. There is mild chronic ischemic microvascular disease. Vascular: No hyperdense vessel or unexpected calcification. Skull: Normal. Negative for fracture or focal lesion. Sinuses/Orbits:  No acute finding. Other: None. IMPRESSION: 1.  No acute findings. 2. Mild chronic ischemic microvascular disease and age related atrophic change. Electronically Signed   By: Marin Olp M.D.   On: 12/26/2019 11:12   DG Hip Unilat W or Wo Pelvis 2-3 Views Left  Result Date: 12/26/2019 CLINICAL DATA:  Fall with left hip pain. EXAM: DG HIP (WITH OR WITHOUT PELVIS) 2-3V LEFT COMPARISON:  None. FINDINGS: Bones are osteopenic. No acute fracture or dislocation identified. The bony pelvis appears intact. No significant arthropathy. No bony lesions. Soft tissues are unremarkable. IMPRESSION: No acute fracture identified. Electronically Signed   By: Aletta Edouard M.D.   On: 12/26/2019 11:11    Procedures Procedures (including critical care time)  Medications Ordered in ED Medications  HYDROcodone-acetaminophen (NORCO/VICODIN) 5-325 MG per tablet 1 tablet (1 tablet Oral Given 12/26/19 1132)    ED Course  I have reviewed the triage vital signs and the nursing notes.  Pertinent labs & imaging results that were available during my care of the patient were reviewed by me and considered in my medical decision making (see chart for  details).    MDM Rules/Calculators/A&P                      To be discharged back home.  I have advised patient to follow-up with her primary doctor.  Told to return here as needed.  The x-rays and CT scans did not show any abnormality.  Patient states the pain in her hips is chronic as well as in her back. Final Clinical Impression(s) / ED Diagnoses Final diagnoses:  None    Rx / DC Orders ED Discharge Orders    None       Dalia Heading, PA-C 12/26/19 [address], [place], PA-C 12/26/19 1317    Daleen Bo, MD 12/27/19 [phone]

## 2019-12-26 NOTE — ED Triage Notes (Signed)
Pt brought in by [facility] with c/o of a fall, she was trying to get up from bed with walker and lost her balance and sat down/fell to floor. Pt is complaining of left hip pain and lower back pain with movement 5/10.

## 2019-12-26 NOTE — Discharge Instructions (Addendum)
Return here as needed. Follow up with your doctor. °

## 2019-12-26 NOTE — ED Provider Notes (Signed)
  Face-to-face evaluation   History: She is here for evaluation of fall at home today.  She was able to get up and ambulate using her walker afterwards.  She complains of pain mostly in her left hip but it has since radiated to the right hip as well.  Physical exam: Alert, elderly female.  She is able to lift both arms and legs off the stretcher without significant difficulty.  She is mildly tender in the left lateral hip region.  No respiratory distress.  No focal chest wall tenderness.  Medical screening examination/treatment/procedure(s) were conducted as a shared visit with non-physician practitioner(s) and myself.  I personally evaluated the patient during the encounter    Mancel Bale, MD 12/27/19 [phone]

## 2020-01-30 ENCOUNTER — Other Ambulatory Visit: Payer: Self-pay

## 2020-01-30 ENCOUNTER — Encounter (HOSPITAL_COMMUNITY): Payer: Self-pay | Admitting: Internal Medicine

## 2020-01-30 ENCOUNTER — Inpatient Hospital Stay (HOSPITAL_COMMUNITY)
Admission: EM | Admit: 2020-01-30 | Discharge: 2020-02-04 | DRG: 517 | Disposition: A | Discharge status: Skilled Nursing Facility | Payer: Medicare Other | Attending: Internal Medicine | Admitting: Internal Medicine

## 2020-01-30 ENCOUNTER — Emergency Department (HOSPITAL_COMMUNITY): Payer: Medicare Other

## 2020-01-30 DIAGNOSIS — M4856XA Collapsed vertebra, not elsewhere classified, lumbar region, initial encounter for fracture: Principal | ICD-10-CM | POA: Diagnosis present

## 2020-01-30 DIAGNOSIS — Z66 Do not resuscitate: Secondary | ICD-10-CM | POA: Diagnosis present

## 2020-01-30 DIAGNOSIS — S32020A Wedge compression fracture of second lumbar vertebra, initial encounter for closed fracture: Secondary | ICD-10-CM | POA: Diagnosis not present

## 2020-01-30 DIAGNOSIS — S32030A Wedge compression fracture of third lumbar vertebra, initial encounter for closed fracture: Secondary | ICD-10-CM

## 2020-01-30 DIAGNOSIS — E890 Postprocedural hypothyroidism: Secondary | ICD-10-CM

## 2020-01-30 DIAGNOSIS — Z20822 Contact with and (suspected) exposure to covid-19: Secondary | ICD-10-CM | POA: Diagnosis present

## 2020-01-30 DIAGNOSIS — T50Z95A Adverse effect of other vaccines and biological substances, initial encounter: Secondary | ICD-10-CM

## 2020-01-30 DIAGNOSIS — R509 Fever, unspecified: Secondary | ICD-10-CM | POA: Diagnosis present

## 2020-01-30 DIAGNOSIS — G8929 Other chronic pain: Secondary | ICD-10-CM | POA: Diagnosis present

## 2020-01-30 DIAGNOSIS — Z79890 Hormone replacement therapy (postmenopausal): Secondary | ICD-10-CM

## 2020-01-30 DIAGNOSIS — S32000A Wedge compression fracture of unspecified lumbar vertebra, initial encounter for closed fracture: Secondary | ICD-10-CM

## 2020-01-30 DIAGNOSIS — M545 Low back pain: Secondary | ICD-10-CM | POA: Diagnosis not present

## 2020-01-30 DIAGNOSIS — Z79899 Other long term (current) drug therapy: Secondary | ICD-10-CM

## 2020-01-30 DIAGNOSIS — R1310 Dysphagia, unspecified: Secondary | ICD-10-CM | POA: Diagnosis present

## 2020-01-30 DIAGNOSIS — E039 Hypothyroidism, unspecified: Secondary | ICD-10-CM | POA: Diagnosis present

## 2020-01-30 DIAGNOSIS — Z8673 Personal history of transient ischemic attack (TIA), and cerebral infarction without residual deficits: Secondary | ICD-10-CM

## 2020-01-30 DIAGNOSIS — M549 Dorsalgia, unspecified: Secondary | ICD-10-CM

## 2020-01-30 DIAGNOSIS — T50B95A Adverse effect of other viral vaccines, initial encounter: Secondary | ICD-10-CM | POA: Diagnosis present

## 2020-01-30 DIAGNOSIS — Z90710 Acquired absence of both cervix and uterus: Secondary | ICD-10-CM

## 2020-01-30 DIAGNOSIS — Z9049 Acquired absence of other specified parts of digestive tract: Secondary | ICD-10-CM

## 2020-01-30 DIAGNOSIS — R339 Retention of urine, unspecified: Secondary | ICD-10-CM | POA: Diagnosis present

## 2020-01-30 LAB — CBC WITH DIFFERENTIAL/PLATELET
Abs Immature Granulocytes: 0.06 10*3/uL (ref 0.00–0.07)
Basophils Absolute: 0.1 10*3/uL (ref 0.0–0.1)
Basophils Relative: 1 %
Eosinophils Absolute: 0.2 10*3/uL (ref 0.0–0.5)
Eosinophils Relative: 3 %
HCT: 37 % (ref 36.0–46.0)
Hemoglobin: 12.2 g/dL (ref 12.0–15.0)
Immature Granulocytes: 1 %
Lymphocytes Relative: 20 %
Lymphs Abs: 1.4 10*3/uL (ref 0.7–4.0)
MCH: 31.4 pg (ref 26.0–34.0)
MCHC: 33 g/dL (ref 30.0–36.0)
MCV: 95.4 fL (ref 80.0–100.0)
Monocytes Absolute: 0.7 10*3/uL (ref 0.1–1.0)
Monocytes Relative: 10 %
Neutro Abs: 4.5 10*3/uL (ref 1.7–7.7)
Neutrophils Relative %: 65 %
Platelets: 195 10*3/uL (ref 150–400)
RBC: 3.88 MIL/uL (ref 3.87–5.11)
RDW: 16 % — ABNORMAL HIGH (ref 11.5–15.5)
WBC: 6.8 10*3/uL (ref 4.0–10.5)
nRBC: 0 % (ref 0.0–0.2)

## 2020-01-30 LAB — COMPREHENSIVE METABOLIC PANEL
ALT: 14 U/L (ref 0–44)
AST: 23 U/L (ref 15–41)
Albumin: 3.9 g/dL (ref 3.5–5.0)
Alkaline Phosphatase: 81 U/L (ref 38–126)
Anion gap: 7 (ref 5–15)
BUN: 13 mg/dL (ref 8–23)
CO2: 24 mmol/L (ref 22–32)
Calcium: 8.4 mg/dL — ABNORMAL LOW (ref 8.9–10.3)
Chloride: 105 mmol/L (ref 98–111)
Creatinine, Ser: 0.75 mg/dL (ref 0.44–1.00)
GFR calc Af Amer: >60 mL/min (ref >60)
GFR calc non Af Amer: >60 mL/min (ref >60)
Glucose, Bld: 91 mg/dL (ref 70–99)
Potassium: 3.4 mmol/L — ABNORMAL LOW (ref 3.5–5.1)
Sodium: 136 mmol/L (ref 135–145)
Total Bilirubin: 0.9 mg/dL (ref 0.3–1.2)
Total Protein: 7.1 g/dL (ref 6.5–8.1)

## 2020-01-30 LAB — LACTIC ACID, PLASMA
Lactic Acid, Venous: 1 mmol/L (ref 0.5–1.9)
Lactic Acid, Venous: 1.2 mmol/L (ref 0.5–1.9)

## 2020-01-30 LAB — I-STAT CHEM 8, ED
BUN: 12 mg/dL (ref 8–23)
Calcium, Ion: 1.07 mmol/L — ABNORMAL LOW (ref 1.15–1.40)
Chloride: 102 mmol/L (ref 98–111)
Creatinine, Ser: 0.6 mg/dL (ref 0.44–1.00)
Glucose, Bld: 84 mg/dL (ref 70–99)
HCT: 41 % (ref 36.0–46.0)
Hemoglobin: 13.9 g/dL (ref 12.0–15.0)
Potassium: 3.5 mmol/L (ref 3.5–5.1)
Sodium: 137 mmol/L (ref 135–145)
TCO2: 24 mmol/L (ref 22–32)

## 2020-01-30 LAB — LIPASE, BLOOD: Lipase: 34 U/L (ref 11–51)

## 2020-01-30 LAB — URINALYSIS, ROUTINE W REFLEX MICROSCOPIC
Bilirubin Urine: NEGATIVE
Glucose, UA: 50 mg/dL — AB
Hgb urine dipstick: NEGATIVE
Ketones, ur: NEGATIVE mg/dL
Leukocytes,Ua: NEGATIVE
Nitrite: NEGATIVE
Protein, ur: NEGATIVE mg/dL
Specific Gravity, Urine: 1.006 (ref 1.005–1.030)
pH: 8 (ref 5.0–8.0)

## 2020-01-30 LAB — RESPIRATORY PANEL BY RT PCR (FLU A&B, COVID)
Influenza A by PCR: NEGATIVE
Influenza B by PCR: NEGATIVE
SARS Coronavirus 2 by RT PCR: NEGATIVE

## 2020-01-30 LAB — TROPONIN I (HIGH SENSITIVITY): Troponin I (High Sensitivity): 11 ng/L (ref <18)

## 2020-01-30 LAB — TSH: TSH: 37.457 u[IU]/mL — ABNORMAL HIGH (ref 0.350–4.500)

## 2020-01-30 LAB — CK: Total CK: 93 U/L (ref 38–234)

## 2020-01-30 MED ORDER — HYDROCODONE-ACETAMINOPHEN 5-325 MG PO TABS
0.5000 | ORAL_TABLET | Freq: Once | ORAL | Status: AC
  Administered 2020-01-30: 0.5 via ORAL
  Filled 2020-01-30: qty 1

## 2020-01-30 MED ORDER — ONDANSETRON HCL 4 MG PO TABS: 4.0000 mg | ORAL_TABLET | Freq: Four times a day (QID) | ORAL | Status: DC | PRN

## 2020-01-30 MED ORDER — GABAPENTIN 100 MG PO CAPS
100.0000 mg | ORAL_CAPSULE | Freq: Three times a day (TID) | ORAL | Status: DC
  Administered 2020-01-30 – 2020-02-01 (×5): 100 mg via ORAL
  Filled 2020-01-30 (×5): qty 1

## 2020-01-30 MED ORDER — ACETAMINOPHEN 325 MG PO TABS
650.0000 mg | ORAL_TABLET | Freq: Once | ORAL | Status: AC
  Administered 2020-01-30: 650 mg via ORAL
  Filled 2020-01-30: qty 2

## 2020-01-30 MED ORDER — ACETAMINOPHEN 325 MG PO TABS: 650.0000 mg | ORAL_TABLET | Freq: Four times a day (QID) | ORAL | Status: DC | PRN

## 2020-01-30 MED ORDER — ENOXAPARIN SODIUM 30 MG/0.3ML ~~LOC~~ SOLN
30.0000 mg | Freq: Every day | SUBCUTANEOUS | Status: DC
  Administered 2020-01-31: 30 mg via SUBCUTANEOUS
  Filled 2020-01-30: qty 0.3

## 2020-01-30 MED ORDER — ACETAMINOPHEN 650 MG RE SUPP: 650.0000 mg | Freq: Four times a day (QID) | RECTAL | Status: DC | PRN

## 2020-01-30 MED ORDER — FENTANYL CITRATE (PF) 100 MCG/2ML IJ SOLN
25.0000 ug | Freq: Once | INTRAMUSCULAR | Status: AC
  Administered 2020-01-30: 25 ug via INTRAVENOUS
  Filled 2020-01-30: qty 2

## 2020-01-30 MED ORDER — HYDROCODONE-ACETAMINOPHEN 5-325 MG PO TABS
1.0000 | ORAL_TABLET | Freq: Four times a day (QID) | ORAL | Status: DC | PRN
  Administered 2020-01-31: 1 via ORAL
  Administered 2020-01-31: 2 via ORAL
  Filled 2020-01-30 (×2): qty 2
  Filled 2020-01-30: qty 1

## 2020-01-30 MED ORDER — SODIUM CHLORIDE (PF) 0.9 % IJ SOLN
INTRAMUSCULAR | Status: AC
  Filled 2020-01-30: qty 50

## 2020-01-30 MED ORDER — ONDANSETRON HCL 4 MG/2ML IJ SOLN: 4.0000 mg | Freq: Four times a day (QID) | INTRAMUSCULAR | Status: DC | PRN

## 2020-01-30 MED ORDER — IOHEXOL 300 MG/ML  SOLN
100.0000 mL | Freq: Once | INTRAMUSCULAR | Status: AC
  Administered 2020-01-30: 100 mL via INTRAVENOUS

## 2020-01-30 MED ORDER — AMLODIPINE BESYLATE 5 MG PO TABS
2.5000 mg | ORAL_TABLET | Freq: Once | ORAL | Status: AC
  Administered 2020-01-30: 2.5 mg via ORAL
  Filled 2020-01-30: qty 1

## 2020-01-30 NOTE — Progress Notes (Signed)
Rx Brief note: Lovenox  Wt = 43 kg, CrCl ~47 ml/min (N)  rx adjusted Lovenox to 30 mg sq daily in pt with wt <45 kg  Thanks Lorenza Evangelist 01/30/2020 10:27 PM

## 2020-01-30 NOTE — ED Provider Notes (Signed)
[facility] DEPT Provider Note   CSN: [id number] Arrival date & time: 01/30/20  1522     History Chief Complaint  Patient presents with  . GENERALIZED PAIN    Tara Nolan is a 86 y.o. female.  HPI   Pt is a 86 y/o female with a h/o CVA, thyroid disease, who presents to the ED today for eval of generalized body aches that started 2 days ago. States that the pain was so bad she could not walk at home. She also reports chills, rhinorrhea. She states she had some chest pain and shortness of breath. She states she has had this all day every day "for quite awhile". She estimates she has had this for years. Denies sore throat, cough, nvd or constipation.   She states that she had a the first COVID vaccine 3 days ago.   Past Medical History:  Diagnosis Date  . Stroke ([place])   . Thyroid disease     Patient Active Problem List   Diagnosis Date Noted  . Syncope and collapse 11/22/2018  . Back pain 11/22/2018  . Hypothyroid 11/22/2018  . History of CVA (cerebrovascular accident) 09/12/2017  . Fall 09/08/2017  . Right shoulder pain 09/08/2017  . Left wrist fracture 09/08/2017  . History of thyroid disease 09/08/2017  . Chest discomfort 09/08/2017  . Hip pain, acute 06/20/2012  . Knee pain 06/20/2012  . Hamstring tendon rupture 06/20/2012    Past Surgical History:  Procedure Laterality Date  . ABDOMINAL HYSTERECTOMY    . APPENDECTOMY    . HERNIA REPAIR    . THYROIDECTOMY       OB History   No obstetric history on file.     No family history on file.  Social History   Tobacco Use  . Smoking status: Never Smoker  . Smokeless tobacco: Never Used  Substance Use Topics  . Alcohol use: No  . Drug use: No    Home Medications Prior to Admission medications   Medication Sig Start Date End Date Taking? Authorizing Provider  acetaminophen (TYLENOL) 500 MG tablet Take 250 mg by mouth every 6 (six) hours as needed for mild pain.     [provider]  amLODipine (NORVASC) 2.5 MG tablet Take 1 tablet (2.5 mg total) by mouth daily. Patient not taking: Reported on 12/02/2019 11/27/18   Roxan Hockey, MD  cephALEXin (KEFLEX) 500 MG capsule Take 1 capsule (500 mg total) by mouth 3 (three) times daily. Patient not taking: Reported on 12/26/2019 12/02/19   Lacretia Leigh, MD  HYDROcodone-acetaminophen (NORCO/VICODIN) 5-325 MG tablet Take 0.5 tablets by mouth every 8 (eight) hours as needed for moderate pain. 12/26/19   Lawyer, Harrell Gave, PA-C  levothyroxine (SYNTHROID, LEVOTHROID) 25 MCG tablet Take 1 tablet (25 mcg total) by mouth daily before breakfast. Patient not taking: Reported on 12/08/2019 11/27/18   Roxan Hockey, MD  methocarbamol (ROBAXIN) 500 MG tablet Take 1 tablet (500 mg total) by mouth 2 (two) times daily. Patient not taking: Reported on 12/02/2019 11/27/18   Roxan Hockey, MD    Allergies    Other  Review of Systems   Review of Systems  Constitutional: Negative for chills and fever.  HENT: Negative for ear pain and sore throat.   Eyes: Negative for pain and visual disturbance.  Respiratory: Negative for cough and shortness of breath.   Cardiovascular: Negative for chest pain.  Gastrointestinal: Negative for abdominal pain, constipation, diarrhea, nausea and vomiting.  Genitourinary: Negative for dysuria and hematuria.  Musculoskeletal: Positive for back pain.       Generalized body aches  Skin: Negative for rash.  Neurological: Negative for headaches.  All other systems reviewed and are negative.   Physical Exam Updated Vital Signs BP (!) 204/106 (BP Location: Left Arm)   Pulse 92   Temp 100.1 F (37.8 C) (Rectal)   Resp 16   SpO2 97%   Physical Exam Vitals and nursing note reviewed.  Constitutional:      General: She is not in acute distress.    Appearance: She is well-developed.  HENT:     Head: Normocephalic and atraumatic.  Eyes:     Conjunctiva/sclera: Conjunctivae normal.    Cardiovascular:     Rate and Rhythm: Normal rate and regular rhythm.     Heart sounds: Normal heart sounds. No murmur.  Pulmonary:     Effort: Pulmonary effort is normal. No respiratory distress.     Breath sounds: Normal breath sounds. No wheezing, rhonchi or rales.  Abdominal:     General: Bowel sounds are normal. There is distension.     Palpations: Abdomen is soft.     Tenderness: There is abdominal tenderness (diffuse). There is guarding. There is no rebound.  Musculoskeletal:     Cervical back: Neck supple.     Comments: Diffuse midline TTP, more focally painful along the lumbar spine  Skin:    General: Skin is warm and dry.  Neurological:     Mental Status: She is alert.     Comments: Mental Status:  Alert, thought content appropriate, able to give a coherent history. Speech fluent without evidence of aphasia. Able to follow 2 step commands without difficulty.  Motor:  Normal tone. 5/5 strength of BUE and BLE major muscle groups including strong and equal grip strength and dorsiflexion/plantar flexion Sensory: light touch normal in all extremities.     ED Results / Procedures / Treatments   Labs (all labs ordered are listed, but only abnormal results are displayed) Labs Reviewed  CBC WITH DIFFERENTIAL/PLATELET - Abnormal; Notable for the following components:      Result Value   RDW 16.0 (*)    All other components within normal limits  COMPREHENSIVE METABOLIC PANEL - Abnormal; Notable for the following components:   Potassium 3.4 (*)    Calcium 8.4 (*)    All other components within normal limits  URINALYSIS, ROUTINE W REFLEX MICROSCOPIC - Abnormal; Notable for the following components:   Color, Urine STRAW (*)    Glucose, UA 50 (*)    All other components within normal limits  I-STAT CHEM 8, ED - Abnormal; Notable for the following components:   Calcium, Ion 1.07 (*)    All other components within normal limits  CULTURE, BLOOD (ROUTINE X 2)  CULTURE, BLOOD  (ROUTINE X 2)  RESPIRATORY PANEL BY RT PCR (FLU A&B, COVID)  URINE CULTURE  LIPASE, BLOOD  LACTIC ACID, PLASMA  CK  LACTIC ACID, PLASMA  TROPONIN I (HIGH SENSITIVITY)    EKG None  Radiology DG Abd Acute W/Chest  Result Date: 01/30/2020 CLINICAL DATA:  Diffuse abdominal pain for the past 2 years. Previous appendectomy and hernia repair as well as abdominal hysterectomy. EXAM: DG ABDOMEN ACUTE W/ 1V CHEST COMPARISON:  Abdomen and pelvis CT dated 12/08/2019 and chest radiographs dated 11/22/2018, 09/07/2017 and 06/20/2012. FINDINGS: Stable mildly enlarged cardiac silhouette and tortuous and partially calcified thoracic aorta. Clear lungs with normal vascularity. Stable biapical pleural and parenchymal scarring. A small calcified granuloma seen  in the lateral aspect of the right upper lung zone on 09/07/2017 does not have as well defined margins as previously. Diffuse osteopenia and multiple thoracic and lumbar vertebral compression deformities with progression. Mild scoliosis. Normal bowel gas pattern without free peritoneal air. Mildly prominent stool in the right colon. No free peritoneal air. IMPRESSION: 1. No acute abnormality. 2. Stable mild cardiomegaly. 3. Diffuse and multiple vertebral compression deformities with progression. Electronically Signed   By: Beckie Salts M.D.   On: 01/30/2020 16:36    Procedures Procedures (including critical care time)  Medications Ordered in ED Medications  sodium chloride (PF) 0.9 % injection (has no administration in time range)  acetaminophen (TYLENOL) tablet 650 mg (650 mg Oral Given 01/30/20 1700)  amLODipine (NORVASC) tablet 2.5 mg (2.5 mg Oral Given 01/30/20 1723)  HYDROcodone-acetaminophen (NORCO/VICODIN) 5-325 MG per tablet 0.5 tablet (0.5 tablets Oral Given 01/30/20 1701)  iohexol (OMNIPAQUE) 300 MG/ML solution 100 mL (100 mLs Intravenous Contrast Given 01/30/20 1736)    ED Course  I have reviewed the triage vital signs and the nursing  notes.  Pertinent labs & imaging results that were available during my care of the patient were reviewed by me and considered in my medical decision making (see chart for details).    MDM Rules/Calculators/A&P                      86 y/o female presenting for eval of generalized body aches for 2 days.   Pt boderline febrile and borderline tachycardic. bp elevated. satting well on RA.   Reviewed labs CBC w/o leukocytosis or anemia. CMP with mild hypokalemia, otherwise reassuring Lipase wnl Trop wnl CK is wnl Lactic wnl UA w/o evidence of UTI Blood cultures obtained.  COVID test pending  Xray abd/chest with no acute abnormality. Stable mild cardiomegaly. Diffuse and multiple vertebral compression deformities with progression  At shift change, care transitioned to Dr. Silverio Lay with plan to f/u on pending w/u.   Final Clinical Impression(s) / ED Diagnoses Final diagnoses:  Back pain    Rx / DC Orders ED Discharge Orders    None       Karrie Meres, PA-C 01/30/20 1742    Charlynne Pander, MD 01/30/20 [phone]

## 2020-01-30 NOTE — ED Notes (Signed)
Pt to xray

## 2020-01-30 NOTE — ED Notes (Signed)
Son updated with pt room number and pt status.

## 2020-01-30 NOTE — H&P (Signed)
History and Physical    Tara Nolan BJS:[id number] DOB: [birth year] DOA: 01/30/2020  PCP: [name], MD  Patient coming from: Home  I have personally briefly reviewed patient's old medical records in [facility]  Chief Complaint: Body aches, weakness  HPI: Tara Nolan is a 86 y.o. female with medical history significant of CVA, hypothyroidism.  Pt currently lives at home, son manages medications, but patient pretty independent and does most ADLs herself.  Pt got COVID shot x3 days ago.  Chills, body aches, rhinorrhea onset 2 days ago.  Generalized weakness.  Symptoms so severe she cant walk.  Severe midline back pain.  No reported fall.  No sore throat, no cough, no N/V/D.   ED Course: Tm 100.1.  WBC nl no other SIRS.  CXR neg, COVID neg, flu neg, UA neg.  Acute to subacute mild compression fx of L2 and L3, chronic of L1.   Review of Systems: As per HPI, otherwise all review of systems negative.  Past Medical History:  Diagnosis Date  . Stroke (HCC)   . Thyroid disease     Past Surgical History:  Procedure Laterality Date  . ABDOMINAL HYSTERECTOMY    . APPENDECTOMY    . HERNIA REPAIR    . THYROIDECTOMY       reports that she has never smoked. She has never used smokeless tobacco. She reports that she does not drink alcohol or use drugs.  Allergies  Allergen Reactions  . Other Other (See Comments)    All medications make me bonkers     No family history on file. No sick contacts.  Prior to Admission medications   Medication Sig Start Date End Date Taking? Authorizing Provider  gabapentin (NEURONTIN) 100 MG capsule Take 100 mg by mouth 3 (three) times daily.  01/20/20  Yes [provider]    Physical Exam: Vitals:   01/30/20 1900 01/30/20 1925 01/30/20 1930 01/30/20 2000  BP: 139/78 (!) 149/96 (!) 158/83 123/68  Pulse: 72 73 67 64  Resp: 16 20 12 13   Temp:      TempSrc:      SpO2: 95% 97% 91% 95%    Constitutional:  NAD, calm, comfortable Eyes: PERRL, lids and conjunctivae normal ENMT: Mucous membranes are moist. Posterior pharynx clear of any exudate or lesions.Normal dentition.  Neck: normal, supple, no masses, no thyromegaly Respiratory: clear to auscultation bilaterally, no wheezing, no crackles. Normal respiratory effort. No accessory muscle use.  Cardiovascular: Regular rate and rhythm, no murmurs / rubs / gallops. No extremity edema. 2+ pedal pulses. No carotid bruits.  Abdomen: no tenderness, no masses palpated. No hepatosplenomegaly. Bowel sounds positive.  Musculoskeletal: no clubbing / cyanosis. No joint deformity upper and lower extremities. Good ROM, no contractures. Normal muscle tone.  Skin: no rashes, lesions, ulcers. No induration Neurologic: No saddle anesthesia, 4/5 strength BLE. Psychiatric: Normal judgment and insight. Alert and oriented x 3. Normal mood.    Labs on Admission: I have personally reviewed following labs and imaging studies  CBC: Recent Labs  Lab 01/30/20 1637 01/30/20 1722  WBC 6.8  --   NEUTROABS 4.5  --   HGB 12.2 13.9  HCT 37.0 41.0  MCV 95.4  --   PLT 195  --    Basic Metabolic Panel: Recent Labs  Lab 01/30/20 1637 01/30/20 1722  NA 136 137  K 3.4* 3.5  CL 105 102  CO2 24  --   GLUCOSE 91 84  BUN 13 12  CREATININE 0.75 0.60  CALCIUM 8.4*  --    GFR: CrCl cannot be calculated (Unknown ideal weight.). Liver Function Tests: Recent Labs  Lab 01/30/20 1637  AST 23  ALT 14  ALKPHOS 81  BILITOT 0.9  PROT 7.1  ALBUMIN 3.9   Recent Labs  Lab 01/30/20 1637  LIPASE 34   No results for input(s): AMMONIA in the last 168 hours. Coagulation Profile: No results for input(s): INR, PROTIME in the last 168 hours. Cardiac Enzymes: Recent Labs  Lab 01/30/20 1640  CKTOTAL 93   BNP (last 3 results) No results for input(s): PROBNP in the last 8760 hours. HbA1C: No results for input(s): HGBA1C in the last 72 hours. CBG: No results for  input(s): GLUCAP in the last 168 hours. Lipid Profile: No results for input(s): CHOL, HDL, LDLCALC, TRIG, CHOLHDL, LDLDIRECT in the last 72 hours. Thyroid Function Tests: No results for input(s): TSH, T4TOTAL, FREET4, T3FREE, THYROIDAB in the last 72 hours. Anemia Panel: No results for input(s): VITAMINB12, FOLATE, FERRITIN, TIBC, IRON, RETICCTPCT in the last 72 hours. Urine analysis:    Component Value Date/Time   COLORURINE STRAW (A) 01/30/2020 1637   APPEARANCEUR CLEAR 01/30/2020 1637   LABSPEC 1.006 01/30/2020 1637   PHURINE 8.0 01/30/2020 1637   GLUCOSEU 50 (A) 01/30/2020 1637   HGBUR NEGATIVE 01/30/2020 [place] 01/30/2020 1637   KETONESUR NEGATIVE 01/30/2020 1637   PROTEINUR NEGATIVE 01/30/2020 1637   NITRITE NEGATIVE 01/30/2020 [place] 01/30/2020 1637    Radiological Exams on Admission: CT Head Wo Contrast  Result Date: 01/30/2020 CLINICAL DATA:  Transient ischemic attack. EXAM: CT HEAD WITHOUT CONTRAST TECHNIQUE: Contiguous axial images were obtained from the base of the skull through the vertex without intravenous contrast. COMPARISON:  12/26/2019 FINDINGS: Brain: Stable minimally enlarged ventricles and moderately enlarged subarachnoid spaces involving the cerebral hemispheres and posterior fossa. Stable mild patchy white matter low density in both cerebral hemispheres. No intracranial hemorrhage, mass lesion or CT evidence of acute infarction. Vascular: No hyperdense vessel or unexpected calcification. Skull: Normal. Negative for fracture or focal lesion. Sinuses/Orbits: Stable single opacified right ethmoid air cell. Unremarkable orbits. Other: Bilateral temporomandibular joint degenerative changes. IMPRESSION: 1. No acute abnormality. 2. Stable atrophy and chronic small vessel white matter ischemic changes. 3. Bilateral TMJ degenerative changes. Electronically Signed   By: Claudie Revering M.D.   On: 01/30/2020 18:39   CT ABDOMEN PELVIS W  CONTRAST  Result Date: 01/30/2020 CLINICAL DATA:  Diffuse abdominal pain, worse in the back. Difficulty walking. [place] 2 years ago with chronic back pain since then. The difficulty walking started today. The lower back pain is severe. EXAM: CT ABDOMEN AND PELVIS WITH CONTRAST TECHNIQUE: Multidetector CT imaging of the abdomen and pelvis was performed using the standard protocol following bolus administration of intravenous contrast. CONTRAST:  18mL OMNIPAQUE IOHEXOL 300 MG/ML  SOLN COMPARISON:  12/08/2019 FINDINGS: Lower chest: Stable mild linear scarring at both lung bases. Stable enlarged heart. Hepatobiliary: No focal liver abnormality is seen other than a tiny right lobe cyst. No gallstones, gallbladder wall thickening, or biliary dilatation. Pancreas: Mild diffuse pancreatic ductal dilatation with no obstructing stone or mass seen. Otherwise, unremarkable pancreas. Spleen: Normal in size without focal abnormality. Adrenals/Urinary Tract: Bilateral renal cysts. Normal appearing adrenal glands. Distended urinary bladder. Unremarkable visualized portions of the ureters. No calculi or hydronephrosis seen. Stomach/Bowel: Multiple sigmoid colon diverticula without evidence of diverticulitis. No evidence of appendicitis. Unremarkable stomach and small bowel. Vascular/Lymphatic: Atheromatous arterial  calcifications without aneurysm. No enlarged lymph nodes. Reproductive: Status post hysterectomy. No adnexal masses. Other: No abdominal wall hernia or abnormality. No abdominopelvic ascites. Musculoskeletal: Diffuse osteopenia. Stable thoracolumbar scoliosis and degenerative changes. These include facet degenerative changes with stable grade 1 anterolisthesis at the L4-5 level and grade 2 anterolisthesis at the L5-S1 level. Stable L1 and T10 vertebral compression deformities. No acute fractures or subluxations. IMPRESSION: 1. No acute abnormality. 2. Sigmoid diverticulosis. Electronically Signed   By: Beckie Salts M.D.    On: 01/30/2020 18:30   CT L-SPINE NO CHARGE  Result Date: 01/30/2020 CLINICAL DATA:  Diffuse abdominal pain, pain worse in back, difficulty walking, patient reports falling 2 years ago, back pain since, difficulty walking starting today. Severe low back pain. EXAM: CT LUMBAR SPINE WITHOUT CONTRAST TECHNIQUE: Multidetector CT imaging of the lumbar spine was performed without intravenous contrast administration. Multiplanar CT image reconstructions were also generated. COMPARISON:  Radiographs of the lumbar spine 12/26/2019, lumbar spine CT 11/22/2018, CT abdomen/pelvis 12/08/2019. FINDINGS: Segmentation: 5 lumbar vertebrae. Alignment: Prominent thoracic dextrocurvature. Trace L1 bony retropulsion, unchanged. Redemonstrated L4-L5 and L5-S1 grade 1 anterolisthesis. Vertebrae: Chronic L1 inferior endplate compression deformity without progressive height loss. Unchanged T10 compression fracture there are mild L2 and L3 superior endplate compression deformities which are new as compared to prior CT abdomen/pelvis 12/08/2019. The bones are diffusely demineralized. Paraspinal and other soft tissues: Please refer to CT abdomen/pelvis separately reported for description of intra-abdominal findings. Atrophy of the lumbar paraspinal musculature. Disc levels: Unless otherwise stated, the level by level findings below have not significantly changed since prior MRI lumbar spine CT 11/22/2018. Multilevel disc degeneration. Most notably there is severe disc degeneration at L3-L4 and moderate/severe disc degeneration at L2-L3. T12-L1: Disc bulge, endplate spurring and mild bony retropulsion at the L1 level. Moderate bilateral bony neural foraminal narrowing. No significant spinal canal stenosis L1-L2: Disc bulge eccentric to the right. Endplate spurring. Mild T1 bony retropulsion. Moderate right neural foraminal narrowing. Mild right subarticular narrowing. Central canal patent L2-L3: Disc bulge asymmetric to the right. Endplate  spurring. Mild facet arthrosis/ligamentum flavum hypertrophy. Mild right subarticular and central canal stenosis. Moderate bilateral neural foraminal narrowing. L3-L4: Disc bulge. Endplate spurring. Mild facet arthrosis/ligamentum flavum hypertrophy. Mild to moderate spinal canal stenosis. Moderate right with moderate/severe left neural foraminal narrowing. L4-L5: Grade 1 anterolisthesis with disc uncovering. Disc bulge. Facet/ligamentum flavum hypertrophy. Suspected at least moderate spinal canal stenosis. Severe bilateral neural foraminal narrowing. L5-S1: Grade 1 anterolisthesis. Disc uncovering. No significant spinal canal stenosis. Moderate bilateral neural foraminal narrowing (greater on the right). IMPRESSION: Mild L2 and L3 superior endplate compression deformities are new as compared to CT abdomen/pelvis 12/08/2019 and may be acute or subacute. Redemonstrated T9 and L1 compression deformities with unchanged mild L1 bony retropulsion. Lumbar spondylosis has not appreciably changed from lumbar spine CT 11/22/2018. Multilevel multifactorial spinal canal stenosis, greatest at L4-L5 (suspected at least moderate at this level). Multilevel neural foraminal narrowing as detailed and greatest on the left at L3-L4 (moderate/severe) and bilaterally at L4-L5 (severe). Please refer to concurrently performed CT abdomen/pelvis for soft tissue findings. Electronically Signed   By: Jackey Loge DO   On: 01/30/2020 18:27   DG Abd Acute W/Chest  Result Date: 01/30/2020 CLINICAL DATA:  Diffuse abdominal pain for the past 2 years. Previous appendectomy and hernia repair as well as abdominal hysterectomy. EXAM: DG ABDOMEN ACUTE W/ 1V CHEST COMPARISON:  Abdomen and pelvis CT dated 12/08/2019 and chest radiographs dated 11/22/2018, 09/07/2017 and 06/20/2012. FINDINGS: Stable  mildly enlarged cardiac silhouette and tortuous and partially calcified thoracic aorta. Clear lungs with normal vascularity. Stable biapical pleural and  parenchymal scarring. A small calcified granuloma seen in the lateral aspect of the right upper lung zone on 09/07/2017 does not have as well defined margins as previously. Diffuse osteopenia and multiple thoracic and lumbar vertebral compression deformities with progression. Mild scoliosis. Normal bowel gas pattern without free peritoneal air. Mildly prominent stool in the right colon. No free peritoneal air. IMPRESSION: 1. No acute abnormality. 2. Stable mild cardiomegaly. 3. Diffuse and multiple vertebral compression deformities with progression. Electronically Signed   By: Beckie Salts M.D.   On: 01/30/2020 16:36    EKG: Independently reviewed.  Assessment/Plan Principal Problem:   Compression fracture of lumbar vertebra (HCC) Active Problems:   History of CVA (cerebrovascular accident)   Hypothyroid   Adverse reaction to vaccine, initial encounter    1. Compression fractures of L2 and L3 - 1. Norco for pain control 2. PT/OT 3. SW consult 4. IR consult to see if she does or does not need vertebroplasty / kyphoplasty and to discuss with pts son. 5. Think the generalized weakness and body aches may be more exacerbated due to immune reaction from COVID vaccine. 2. Immune reaction to COVID vaccine - 1. Low grade fever, body aches, chills, etc. 2. Most likely due to COVID vaccine reaction given the timing and lack of other obvious infectious source 3. BCx pending 4. Discussed with son 3. Hypothyroidism - 1. TSH pending 2. Probably needs to be re-started on synthroid most likely with PCP follow up for titration.  DVT prophylaxis: Lovenox Code Status: Full Family Communication: Spoke with son on phone Disposition Plan: TBD Consults called: IR consult placed to decide if kyphoplasty / vertebroplasty would be of any benefit or not and discuss with son Admission status: Place in 41   Shanikka Wonders M. DO [facility]  How to contact the [facility] Attending or Consulting provider 7A  - 7P or covering provider during after hours 7P -7A, for this patient?  1. Check the care team in [facility] and look for a) attending/consulting [facility] provider listed and b) the [facility] team listed 2. Log into [email or web address]  Amion Physician Scheduling and messaging for groups and whole hospitals  On call and physician scheduling software for group practices, residents, hospitalists and other medical providers for call, clinic, rotation and shift schedules. On[employer] is a hospital-wide system for scheduling doctors and paging doctors on call. EasyPlot is for scientific plotting and data analysis.  [email or web address]  and use [place]'s universal password to access. If you do not have the password, please contact the hospital operator.  3. Locate the [facility] provider you are looking for under [facility] and page to a number that you can be directly reached. 4. If you still have difficulty reaching the provider, please page the [facility] (Director on Call) for the Hospitalists listed on amion for assistance.  01/30/2020, 8:23 PM

## 2020-01-30 NOTE — ED Triage Notes (Signed)
PT to ed with c/o pain. Patient states pain is mostly in the back but also complains all over.   98% RA 123/94 Hr 90 CBG 130

## 2020-01-30 NOTE — ED Notes (Signed)
TOM  SON- [phone]  Would like to be called updated plan of care

## 2020-01-31 DIAGNOSIS — Z8673 Personal history of transient ischemic attack (TIA), and cerebral infarction without residual deficits: Secondary | ICD-10-CM

## 2020-01-31 DIAGNOSIS — M545 Low back pain: Secondary | ICD-10-CM | POA: Diagnosis not present

## 2020-01-31 DIAGNOSIS — E890 Postprocedural hypothyroidism: Secondary | ICD-10-CM | POA: Diagnosis present

## 2020-01-31 DIAGNOSIS — R339 Retention of urine, unspecified: Secondary | ICD-10-CM | POA: Diagnosis present

## 2020-01-31 DIAGNOSIS — T50B95A Adverse effect of other viral vaccines, initial encounter: Secondary | ICD-10-CM | POA: Diagnosis present

## 2020-01-31 DIAGNOSIS — S32030A Wedge compression fracture of third lumbar vertebra, initial encounter for closed fracture: Secondary | ICD-10-CM | POA: Diagnosis not present

## 2020-01-31 DIAGNOSIS — Z90710 Acquired absence of both cervix and uterus: Secondary | ICD-10-CM | POA: Diagnosis not present

## 2020-01-31 DIAGNOSIS — G8929 Other chronic pain: Secondary | ICD-10-CM | POA: Diagnosis present

## 2020-01-31 DIAGNOSIS — Z7189 Other specified counseling: Secondary | ICD-10-CM | POA: Diagnosis not present

## 2020-01-31 DIAGNOSIS — Z66 Do not resuscitate: Secondary | ICD-10-CM | POA: Diagnosis not present

## 2020-01-31 DIAGNOSIS — E039 Hypothyroidism, unspecified: Secondary | ICD-10-CM | POA: Diagnosis not present

## 2020-01-31 DIAGNOSIS — S32020A Wedge compression fracture of second lumbar vertebra, initial encounter for closed fracture: Secondary | ICD-10-CM | POA: Diagnosis not present

## 2020-01-31 DIAGNOSIS — R509 Fever, unspecified: Secondary | ICD-10-CM | POA: Diagnosis present

## 2020-01-31 DIAGNOSIS — T50Z95A Adverse effect of other vaccines and biological substances, initial encounter: Secondary | ICD-10-CM | POA: Diagnosis not present

## 2020-01-31 DIAGNOSIS — R1310 Dysphagia, unspecified: Secondary | ICD-10-CM | POA: Diagnosis present

## 2020-01-31 DIAGNOSIS — M4856XA Collapsed vertebra, not elsewhere classified, lumbar region, initial encounter for fracture: Secondary | ICD-10-CM | POA: Diagnosis present

## 2020-01-31 DIAGNOSIS — Z9049 Acquired absence of other specified parts of digestive tract: Secondary | ICD-10-CM | POA: Diagnosis not present

## 2020-01-31 DIAGNOSIS — Z20822 Contact with and (suspected) exposure to covid-19: Secondary | ICD-10-CM | POA: Diagnosis present

## 2020-01-31 DIAGNOSIS — Z79890 Hormone replacement therapy (postmenopausal): Secondary | ICD-10-CM | POA: Diagnosis not present

## 2020-01-31 DIAGNOSIS — M549 Dorsalgia, unspecified: Secondary | ICD-10-CM | POA: Diagnosis present

## 2020-01-31 DIAGNOSIS — Z79899 Other long term (current) drug therapy: Secondary | ICD-10-CM | POA: Diagnosis not present

## 2020-01-31 LAB — CBC
HCT: 41.9 % (ref 36.0–46.0)
Hemoglobin: 13.1 g/dL (ref 12.0–15.0)
MCH: 30.6 pg (ref 26.0–34.0)
MCHC: 31.3 g/dL (ref 30.0–36.0)
MCV: 97.9 fL (ref 80.0–100.0)
Platelets: 194 10*3/uL (ref 150–400)
RBC: 4.28 MIL/uL (ref 3.87–5.11)
RDW: 16.3 % — ABNORMAL HIGH (ref 11.5–15.5)
WBC: 5.1 10*3/uL (ref 4.0–10.5)
nRBC: 0 % (ref 0.0–0.2)

## 2020-01-31 LAB — BASIC METABOLIC PANEL
Anion gap: 6 (ref 5–15)
BUN: 10 mg/dL (ref 8–23)
CO2: 25 mmol/L (ref 22–32)
Calcium: 8.6 mg/dL — ABNORMAL LOW (ref 8.9–10.3)
Chloride: 106 mmol/L (ref 98–111)
Creatinine, Ser: 0.56 mg/dL (ref 0.44–1.00)
GFR calc Af Amer: >60 mL/min (ref >60)
GFR calc non Af Amer: >60 mL/min (ref >60)
Glucose, Bld: 74 mg/dL (ref 70–99)
Potassium: 3.6 mmol/L (ref 3.5–5.1)
Sodium: 137 mmol/L (ref 135–145)

## 2020-01-31 LAB — T4, FREE: Free T4: 0.57 ng/dL — ABNORMAL LOW (ref 0.61–1.12)

## 2020-01-31 MED ORDER — LEVOTHYROXINE SODIUM 25 MCG PO TABS
25.0000 ug | ORAL_TABLET | Freq: Every day | ORAL | Status: DC
  Administered 2020-01-31 – 2020-02-02 (×3): 25 ug via ORAL
  Filled 2020-01-31 (×3): qty 1

## 2020-01-31 MED ORDER — SENNOSIDES-DOCUSATE SODIUM 8.6-50 MG PO TABS: 1.0000 | ORAL_TABLET | Freq: Every evening | ORAL | Status: DC | PRN

## 2020-01-31 MED ORDER — OXYCODONE HCL 5 MG PO TABS: 5.0000 mg | ORAL_TABLET | ORAL | Status: DC | PRN

## 2020-01-31 MED ORDER — POLYETHYLENE GLYCOL 3350 17 G PO PACK
17.0000 g | PACK | Freq: Every day | ORAL | Status: DC
  Administered 2020-01-31 – 2020-02-04 (×3): 17 g via ORAL
  Filled 2020-01-31 (×5): qty 1

## 2020-01-31 MED ORDER — POLYETHYLENE GLYCOL 3350 17 GM/SCOOP PO POWD: 1.0000 | Freq: Once | ORAL | Status: DC

## 2020-01-31 MED ORDER — ENOXAPARIN SODIUM 30 MG/0.3ML ~~LOC~~ SOLN
30.0000 mg | SUBCUTANEOUS | Status: DC
  Administered 2020-02-01 – 2020-02-02 (×2): 30 mg via SUBCUTANEOUS
  Filled 2020-01-31 (×2): qty 0.3

## 2020-01-31 MED ORDER — POLYETHYLENE GLYCOL 3350 17 GM/SCOOP PO POWD: 1.0000 | Freq: Every morning | ORAL | Status: DC

## 2020-01-31 MED ORDER — ACETAMINOPHEN 325 MG PO TABS
325.0000 mg | ORAL_TABLET | Freq: Four times a day (QID) | ORAL | Status: DC
  Administered 2020-01-31 – 2020-02-01 (×2): 325 mg via ORAL
  Filled 2020-01-31 (×2): qty 1

## 2020-01-31 MED ORDER — FENTANYL CITRATE (PF) 100 MCG/2ML IJ SOLN: 12.5000 ug | INTRAMUSCULAR | Status: DC | PRN

## 2020-01-31 MED ORDER — BACLOFEN 10 MG PO TABS
5.0000 mg | ORAL_TABLET | Freq: Three times a day (TID) | ORAL | Status: DC
  Administered 2020-01-31 – 2020-02-01 (×4): 5 mg via ORAL
  Filled 2020-01-31 (×4): qty 1

## 2020-01-31 NOTE — Evaluation (Signed)
Physical Therapy Evaluation Patient Details Name: Tara Nolan MRN: [id number] DOB: Oct 23, 1919 Today's Date: 01/31/2020   History of Present Illness  Tara Nolan is a 86 y.o. female with medical history significant of CVA, hypothyroidism. admitted with weakness, found to have  acute to subacute mild compression fx of L2 and L3, chronic of L1. IR consult pending  Clinical Impression  Pt admitted with above diagnosis.  Pt very pleasant and agreeable to PT. She is quite independent and mostly functions from a w/c level at her baseline. If pt pain improves she may be able to return home with HHPT, son requests pt go to SNF. Discussed with RN and RN states she will also talk with pt dtr regarding d/c plan. Ultimately pt would likely benefit from ALF setting.  Pt currently with functional limitations due to the deficits listed below (see PT Problem List). Pt will benefit from skilled PT to increase their independence and safety with mobility to allow discharge to the venue listed below.       Follow Up Recommendations Home health PT;SNF(pending progress)    Equipment Recommendations  None recommended by PT    Recommendations for Other Services       Precautions / Restrictions Precautions Precautions: Back;Fall Restrictions Weight Bearing Restrictions: No      Mobility  Bed Mobility Overal bed mobility: Needs Assistance Bed Mobility: Supine to Sit     Supine to sit: Min assist;Mod assist;HOB elevated     General bed mobility comments: assist with lateral scooting, assist to elevate trunk fully, incr time d/t pain  Transfers Overall transfer level: Needs assistance Equipment used: 2 person hand held assist Transfers: Sit to/from Stand;Stand Pivot Transfers Sit to Stand: Min assist;+2 physical assistance;+2 safety/equipment Stand pivot transfers: +2 physical assistance;+2 safety/equipment;Min assist       General transfer comment: assist to rise and  balance while  wt shifting to pivot to chair  Ambulation/Gait             General Gait Details: non-amb at baseline  Stairs            Wheelchair Mobility    Modified Rankin (Stroke Patients Only)       Balance Overall balance assessment: Needs assistance;History of Falls Sitting-balance support: No upper extremity supported;Feet supported Sitting balance-Leahy Scale: Fair       Standing balance-Leahy Scale: Poor Standing balance comment: reliant on UE support                             Pertinent Vitals/Pain Pain Assessment: Faces Faces Pain Scale: Hurts little more Pain Location: back and abd Pain Descriptors / Indicators: Grimacing;Sore Pain Intervention(s): Limited activity within patient's tolerance;Monitored during session;Premedicated before session    Home Living Family/patient expects to be discharged to:: Private residence Living Arrangements: Alone Available Help at Discharge: Family;Available PRN/intermittently Type of Home: House Home Access: Stairs to enter   Entrance Stairs-Number of Steps: 1 Home Layout: One level Home Equipment: Walker - 4 wheels;Walker - 2 wheels;Bedside commode      Prior Function Level of Independence: Independent;Independent with assistive device(s)         Comments: pt reports she transfers to w/c, functions independently from w/c level, sponge bathes     Hand Dominance        Extremity/Trunk Assessment   Upper Extremity Assessment Upper Extremity Assessment: Generalized weakness    Lower Extremity Assessment Lower Extremity Assessment: Generalized weakness  Communication      Cognition Arousal/Alertness: Awake/alert Behavior During Therapy: WFL for tasks assessed/performed Overall Cognitive Status: Within Functional Limits for tasks assessed                                        General Comments      Exercises     Assessment/Plan    PT Assessment Patient needs  continued PT services  PT Problem List Decreased strength;Decreased activity tolerance;Decreased balance;Decreased knowledge of use of DME;Decreased mobility;Pain       PT Treatment Interventions DME instruction;Gait training;Functional mobility training;Therapeutic activities;Patient/family education;Therapeutic exercise;Balance training    PT Goals (Current goals can be found in the Care Plan section)  Acute Rehab PT Goals Patient Stated Goal: son states goal is rehab PT Goal Formulation: With patient/family Time For Goal Achievement: 02/14/20 Potential to Achieve Goals: Good    Frequency Min 3X/week   Barriers to discharge        Co-evaluation               AM-PAC PT "6 Clicks" Mobility  Outcome Measure Help needed turning from your back to your side while in a flat bed without using bedrails?: A Lot Help needed moving from lying on your back to sitting on the side of a flat bed without using bedrails?: A Little Help needed moving to and from a bed to a chair (including a wheelchair)?: A Little Help needed standing up from a chair using your arms (e.g., wheelchair or bedside chair)?: A Little Help needed to walk in hospital room?: A Lot Help needed climbing 3-5 steps with a railing? : A Lot 6 Click Score: 15    End of Session Equipment Utilized During Treatment: Gait belt Activity Tolerance: Patient tolerated treatment well Patient left: in chair;with call bell/phone within reach;with chair alarm set;with family/visitor present Nurse Communication: Mobility status PT Visit Diagnosis: Unsteadiness on feet (R26.81);Muscle weakness (generalized) (M62.81)    Time: 8280-0349 PT Time Calculation (min) (ACUTE ONLY): 20 min   Charges:   PT Evaluation $PT Eval Low Complexity: [place], PT   Acute Rehab Dept [facility]): [phone]   01/31/2020    [facility] 01/31/2020, 1:18 PM

## 2020-01-31 NOTE — Progress Notes (Signed)
PROGRESS NOTE  Tara Nolan  JEH:[id number] DOB: 1919/04/26 DOA: 01/30/2020 PCP: [name], MD  Brief Narrative: Tara Nolan is a 86 y.o. female with a history of CVA and hypothyroidism who presented from home with severe lower back pain without history of trauma.  ED Course: Tm 100.1.  WBC nl no other SIRS.  CXR neg, COVID neg, flu neg, UA neg. Acute to subacute mild compression fx of L2 and L3, chronic of L1.  Assessment & Plan: Principal Problem:   Compression fracture of lumbar vertebra (HCC) Active Problems:   History of CVA (cerebrovascular accident)   Hypothyroid   Adverse reaction to vaccine, initial encounter  Low back pain: No midline pain on exam this AM, but severe spasmodic pain with paraspinal tenderness.  - Broad multimodal pain treatment. Pain remains uncontrolled and severely limiting mobility. Tylenol scheduled, baclofen (hold for sedation), gabapentin 100mg  TID, and give oxyIR prn. Heating pad. Bowel regimen ordered. - PT/OT/CSW - IR consulted for consideration of kyphoplasty.   Advanced age: Candid conversation with the patient reveals her very persistent desire to avoid suffering. She is not satisfied with her limited mobility and this is now worsened by pain. We discussed the use of pain medications, code status, and consideration of palliative care following the patient during and after hospitalization.   Hypothyroidism: Undertreated, historically has had elevated TSH consistently which was confirmed again during this admission.  - Start synthroid 15mcg/day. This is < 0.6 mcg/kg/day.   Immune reaction to covid-19 vaccine: No other sources of infection found.  - Monitor blood culture.   DVT prophylaxis: Lovenox 30mg  Code Status: DNR Family Communication: None at bedside this AM Disposition Plan: Uncertain. She remains with severe pain and significant impairment of her functional mobility. Will attempt better control of pain, await IR  recommendations, and begin discussions with palliative care.   Consultants:   Palliative care  IR  Procedures:   None  Antimicrobials:  None   Subjective: Lower back pain is intermittent, severe and sharp, worse with some movements and sometimes spasms without seeming provocation. She points to the right lower back and later says that got better over the course of several minutes, then had spasm on the mirror image left lower back. Denies midline tenderness. No leg numbness, weakness.   Objective: Vitals:   01/30/20 2144 01/30/20 2229 01/31/20 0502 01/31/20 1337  BP: 128/77 (!) 172/83 (!) 164/86 133/70  Pulse: (!) 58 63 63 (!) 54  Resp: 13 14 14 16   Temp:  (!) 97.5 F (36.4 C) 97.6 F (36.4 C)   TempSrc:  Oral Axillary   SpO2: 95% 98% 100% 97%    Intake/Output Summary (Last 24 hours) at 01/31/2020 1603 Last data filed at 01/31/2020 1422 Gross per 24 hour  Intake 1180 ml  Output 2350 ml  Net -1170 ml   Gen: Elderly, pleasant, female in no distress Pulm: Non-labored breathing. Clear to auscultation bilaterally.  CV: Regular rate and rhythm. No murmur, rub, or gallop. No JVD, no pedal edema. GI: Abdomen soft, non-tender, non-distended, with normoactive bowel sounds. No organomegaly or masses felt. MSK: Scoliosis noted, no midline spinal tenderness, significant R > L paraspinal lumbar spasm with tenderness. Skin: No rashes, lesions or ulcers Neuro: Alert and oriented. No focal neurological deficits with attention to lower extremities. Psych: Judgement and insight appear normal. Mood & affect appropriate.   Data Reviewed: I have personally reviewed following labs and imaging studies  CBC: Recent Labs  Lab 01/30/20 1637 01/30/20  1722 01/31/20 0438  WBC 6.8  --  5.1  NEUTROABS 4.5  --   --   HGB 12.2 13.9 13.1  HCT 37.0 41.0 41.9  MCV 95.4  --  97.9  PLT 195  --  194   Basic Metabolic Panel: Recent Labs  Lab 01/30/20 1637 01/30/20 1722 01/31/20 0438  NA 136  137 137  K 3.4* 3.5 3.6  CL 105 102 106  CO2 24  --  25  GLUCOSE 91 84 74  BUN 13 12 10   CREATININE 0.75 0.60 0.56  CALCIUM 8.4*  --  8.6*   GFR: CrCl cannot be calculated (Unknown ideal weight.). Liver Function Tests: Recent Labs  Lab 01/30/20 1637  AST 23  ALT 14  ALKPHOS 81  BILITOT 0.9  PROT 7.1  ALBUMIN 3.9   Recent Labs  Lab 01/30/20 1637  LIPASE 34   No results for input(s): AMMONIA in the last 168 hours. Coagulation Profile: No results for input(s): INR, PROTIME in the last 168 hours. Cardiac Enzymes: Recent Labs  Lab 01/30/20 1640  CKTOTAL 93   BNP (last 3 results) No results for input(s): PROBNP in the last 8760 hours. HbA1C: No results for input(s): HGBA1C in the last 72 hours. CBG: No results for input(s): GLUCAP in the last 168 hours. Lipid Profile: No results for input(s): CHOL, HDL, LDLCALC, TRIG, CHOLHDL, LDLDIRECT in the last 72 hours. Thyroid Function Tests: Recent Labs    01/30/20 1932  TSH 37.457*   Anemia Panel: No results for input(s): VITAMINB12, FOLATE, FERRITIN, TIBC, IRON, RETICCTPCT in the last 72 hours. Urine analysis:    Component Value Date/Time   COLORURINE STRAW (A) 01/30/2020 1637   APPEARANCEUR CLEAR 01/30/2020 1637   LABSPEC 1.006 01/30/2020 1637   PHURINE 8.0 01/30/2020 1637   GLUCOSEU 50 (A) 01/30/2020 1637   HGBUR NEGATIVE 01/30/2020 1637   BILIRUBINUR NEGATIVE 01/30/2020 1637   KETONESUR NEGATIVE 01/30/2020 1637   PROTEINUR NEGATIVE 01/30/2020 1637   NITRITE NEGATIVE 01/30/2020 1637   LEUKOCYTESUR NEGATIVE 01/30/2020 1637   Recent Results (from the past 240 hour(s))  Blood culture (routine x 2)     Status: None (Preliminary result)   Collection Time: 01/30/20  4:39 PM   Specimen: BLOOD LEFT FOREARM  Result Value Ref Range Status   Specimen Description   Final    BLOOD LEFT FOREARM Performed at [facility], [address], [place], [place]    Special Requests   Final     BOTTLES DRAWN AEROBIC AND ANAEROBIC Blood Culture results may not be optimal due to an inadequate volume of blood received in culture bottles Performed at [facility], [address], [place], [place]    Culture   Final    NO GROWTH < 12 HOURS Performed at [facility], [address], [place], [place]    Report Status PENDING  Incomplete  Blood culture (routine x 2)     Status: None (Preliminary result)   Collection Time: 01/30/20  5:56 PM   Specimen: BLOOD LEFT HAND  Result Value Ref Range Status   Specimen Description   Final    BLOOD LEFT HAND Performed at [facility], [address], [place], [place]    Special Requests   Final    BOTTLES DRAWN AEROBIC ONLY Blood Culture results may not be optimal due to an inadequate volume of blood received in culture bottles Performed at [facility], [place] [address], [place], [place] [address]    Culture   Final    NO GROWTH < 12 HOURS Performed at [place] [address] [place], [place] [address]    Report Status PENDING  Incomplete  Respiratory Panel by RT PCR (Flu A&B, Covid) - Nasopharyngeal Swab     Status: None   Collection Time: 01/30/20  5:56 PM   Specimen: Nasopharyngeal Swab  Result Value Ref Range Status   SARS Coronavirus 2 by RT PCR NEGATIVE NEGATIVE Final    Comment: (NOTE) SARS-CoV-2 target nucleic acids are NOT DETECTED. The SARS-CoV-2 RNA is generally detectable in upper respiratoy specimens during the acute phase of infection. The lowest concentration of SARS-CoV-2 viral copies this assay can detect is 131 copies/mL. A negative result does not preclude SARS-Cov-2 infection and should not be used as the sole basis for treatment or other patient management decisions. A negative result may occur with  improper specimen collection/handling, submission of specimen other than nasopharyngeal swab, presence of viral  mutation(s) within the areas targeted by this assay, and inadequate number of viral copies (<131 copies/mL). A negative result must be combined with clinical observations, patient history, and epidemiological information. The expected result is Negative. Fact Sheet for Patients:  PinkCheek.be Fact Sheet for Healthcare Providers:  GravelBags.it This test is not yet ap proved or cleared by the [place] FDA and  has been authorized for detection and/or diagnosis of SARS-CoV-2 by FDA under an Emergency Use Authorization (EUA). This EUA will remain  in effect (meaning this test can be used) for the duration of the COVID-19 declaration under Section 564(b)(1) of the Act, 21 U.S.C. section 360bbb-3(b)(1), unless the authorization is terminated or revoked sooner.    Influenza A by PCR NEGATIVE NEGATIVE Final   Influenza B by PCR NEGATIVE NEGATIVE Final    Comment: (NOTE) The Xpert Xpress SARS-CoV-2/FLU/RSV assay is intended as an aid in  the diagnosis of influenza from Nasopharyngeal swab specimens and  should not be used as a sole basis for treatment. Nasal washings and  aspirates are unacceptable for Xpert Xpress SARS-CoV-2/FLU/RSV  testing. Fact Sheet for Patients: PinkCheek.be Fact Sheet for Healthcare Providers: GravelBags.it This test is not yet approved or cleared by the [place] FDA and  has been authorized for detection and/or diagnosis of SARS-CoV-2 by  FDA under an Emergency Use Authorization (EUA). This EUA will remain  in effect (meaning this test can be used) for the duration of the  Covid-19 declaration under Section 564(b)(1) of the Act, 21  U.S.C. section 360bbb-3(b)(1), unless the authorization is  terminated or revoked. Performed at [facility], [place] [address], [place], [place] [address]       Radiology Studies: CT Head Wo  Contrast  Result Date: 01/30/2020 CLINICAL DATA:  Transient ischemic attack. EXAM: CT HEAD WITHOUT CONTRAST TECHNIQUE: Contiguous axial images were obtained from the base of the skull through the vertex without intravenous contrast. COMPARISON:  12/26/2019 FINDINGS: Brain: Stable minimally enlarged ventricles and moderately enlarged subarachnoid spaces involving the cerebral hemispheres and posterior fossa. Stable mild patchy white matter low density in both cerebral hemispheres. No intracranial hemorrhage, mass lesion or CT evidence of acute infarction. Vascular: No hyperdense vessel or unexpected calcification. Skull: Normal. Negative for fracture or focal lesion. Sinuses/Orbits: Stable single opacified right ethmoid air cell. Unremarkable orbits. Other: Bilateral temporomandibular joint degenerative changes. IMPRESSION: 1. No acute abnormality. 2. Stable atrophy and chronic small vessel white matter ischemic  changes. 3. Bilateral TMJ degenerative changes. Electronically Signed   By: Beckie Salts M.D.   On: 01/30/2020 18:39   CT ABDOMEN PELVIS W CONTRAST  Result Date: 01/30/2020 CLINICAL DATA:  Diffuse abdominal pain, worse in the back. Difficulty walking. Larey Seat 2 years ago with chronic back pain since then. The difficulty walking started today. The lower back pain is severe. EXAM: CT ABDOMEN AND PELVIS WITH CONTRAST TECHNIQUE: Multidetector CT imaging of the abdomen and pelvis was performed using the standard protocol following bolus administration of intravenous contrast. CONTRAST:  OMNIPAQUE IOHEXOL 300 MG/ML  SOLN COMPARISON:  12/08/2019 FINDINGS: Lower chest: Stable mild linear scarring at both lung bases. Stable enlarged heart. Hepatobiliary: No focal liver abnormality is seen other than a tiny right lobe cyst. No gallstones, gallbladder wall thickening, or biliary dilatation. Pancreas: Mild diffuse pancreatic ductal dilatation with no obstructing stone or mass seen. Otherwise, unremarkable  pancreas. Spleen: Normal in size without focal abnormality. Adrenals/Urinary Tract: Bilateral renal cysts. Normal appearing adrenal glands. Distended urinary bladder. Unremarkable visualized portions of the ureters. No calculi or hydronephrosis seen. Stomach/Bowel: Multiple sigmoid colon diverticula without evidence of diverticulitis. No evidence of appendicitis. Unremarkable stomach and small bowel. Vascular/Lymphatic: Atheromatous arterial calcifications without aneurysm. No enlarged lymph nodes. Reproductive: Status post hysterectomy. No adnexal masses. Other: No abdominal wall hernia or abnormality. No abdominopelvic ascites. Musculoskeletal: Diffuse osteopenia. Stable thoracolumbar scoliosis and degenerative changes. These include facet degenerative changes with stable grade 1 anterolisthesis at the L4-5 level and grade 2 anterolisthesis at the L5-S1 level. Stable L1 and T10 vertebral compression deformities. No acute fractures or subluxations. IMPRESSION: 1. No acute abnormality. 2. Sigmoid diverticulosis. Electronically Signed   By: Beckie Salts M.D.   On: 01/30/2020 18:30   CT L-SPINE NO CHARGE  Result Date: 01/30/2020 CLINICAL DATA:  Diffuse abdominal pain, pain worse in back, difficulty walking, patient reports falling 2 years ago, back pain since, difficulty walking starting today. Severe low back pain. EXAM: CT LUMBAR SPINE WITHOUT CONTRAST TECHNIQUE: Multidetector CT imaging of the lumbar spine was performed without intravenous contrast administration. Multiplanar CT image reconstructions were also generated. COMPARISON:  Radiographs of the lumbar spine 12/26/2019, lumbar spine CT 11/22/2018, CT abdomen/pelvis 12/08/2019. FINDINGS: Segmentation: 5 lumbar vertebrae. Alignment: Prominent thoracic dextrocurvature. Trace L1 bony retropulsion, unchanged. Redemonstrated L4-L5 and L5-S1 grade 1 anterolisthesis. Vertebrae: Chronic L1 inferior endplate compression deformity without progressive height loss.  Unchanged T10 compression fracture there are mild L2 and L3 superior endplate compression deformities which are new as compared to prior CT abdomen/pelvis 12/08/2019. The bones are diffusely demineralized. Paraspinal and other soft tissues: Please refer to CT abdomen/pelvis separately reported for description of intra-abdominal findings. Atrophy of the lumbar paraspinal musculature. Disc levels: Unless otherwise stated, the level by level findings below have not significantly changed since prior MRI lumbar spine CT 11/22/2018. Multilevel disc degeneration. Most notably there is severe disc degeneration at L3-L4 and moderate/severe disc degeneration at L2-L3. T12-L1: Disc bulge, endplate spurring and mild bony retropulsion at the L1 level. Moderate bilateral bony neural foraminal narrowing. No significant spinal canal stenosis L1-L2: Disc bulge eccentric to the right. Endplate spurring. Mild T1 bony retropulsion. Moderate right neural foraminal narrowing. Mild right subarticular narrowing. Central canal patent L2-L3: Disc bulge asymmetric to the right. Endplate spurring. Mild facet arthrosis/ligamentum flavum hypertrophy. Mild right subarticular and central canal stenosis. Moderate bilateral neural foraminal narrowing. L3-L4: Disc bulge. Endplate spurring. Mild facet arthrosis/ligamentum flavum hypertrophy. Mild to moderate spinal canal stenosis. Moderate right with moderate/severe left neural  foraminal narrowing. L4-L5: Grade 1 anterolisthesis with disc uncovering. Disc bulge. Facet/ligamentum flavum hypertrophy. Suspected at least moderate spinal canal stenosis. Severe bilateral neural foraminal narrowing. L5-S1: Grade 1 anterolisthesis. Disc uncovering. No significant spinal canal stenosis. Moderate bilateral neural foraminal narrowing (greater on the right). IMPRESSION: Mild L2 and L3 superior endplate compression deformities are new as compared to CT abdomen/pelvis 12/08/2019 and may be acute or subacute.  Redemonstrated T9 and L1 compression deformities with unchanged mild L1 bony retropulsion. Lumbar spondylosis has not appreciably changed from lumbar spine CT 11/22/2018. Multilevel multifactorial spinal canal stenosis, greatest at L4-L5 (suspected at least moderate at this level). Multilevel neural foraminal narrowing as detailed and greatest on the left at L3-L4 (moderate/severe) and bilaterally at L4-L5 (severe). Please refer to concurrently performed CT abdomen/pelvis for soft tissue findings. Electronically Signed   By: Jackey Loge DO   On: 01/30/2020 18:27   DG Abd Acute W/Chest  Result Date: 01/30/2020 CLINICAL DATA:  Diffuse abdominal pain for the past 2 years. Previous appendectomy and hernia repair as well as abdominal hysterectomy. EXAM: DG ABDOMEN ACUTE W/ 1V CHEST COMPARISON:  Abdomen and pelvis CT dated 12/08/2019 and chest radiographs dated 11/22/2018, 09/07/2017 and 06/20/2012. FINDINGS: Stable mildly enlarged cardiac silhouette and tortuous and partially calcified thoracic aorta. Clear lungs with normal vascularity. Stable biapical pleural and parenchymal scarring. A small calcified granuloma seen in the lateral aspect of the right upper lung zone on 09/07/2017 does not have as well defined margins as previously. Diffuse osteopenia and multiple thoracic and lumbar vertebral compression deformities with progression. Mild scoliosis. Normal bowel gas pattern without free peritoneal air. Mildly prominent stool in the right colon. No free peritoneal air. IMPRESSION: 1. No acute abnormality. 2. Stable mild cardiomegaly. 3. Diffuse and multiple vertebral compression deformities with progression. Electronically Signed   By: Beckie Salts M.D.   On: 01/30/2020 16:36    Scheduled Meds:  acetaminophen  325 mg Oral Q6H   baclofen  5 mg Oral TID   [START ON 02/01/2020] enoxaparin (LOVENOX) injection  30 mg Subcutaneous Q24H   gabapentin  100 mg Oral TID   levothyroxine  25 mcg Oral QAC breakfast     polyethylene glycol  17 g Oral Daily   Continuous Infusions:   LOS: 0 days   Time spent: 25 minutes.  Tyrone Nine, MD [facility] [email or web address] 01/31/2020, 4:03 PM

## 2020-01-31 NOTE — Plan of Care (Signed)

## 2020-01-31 NOTE — Consult Note (Signed)
Consultation Note Date: 01/31/2020   Patient Name: Tara Nolan  DOB: [birth year]  MRN: [id number]  Age / Sex: 86 y.o., female   PCP: Chesley Noon, MD Referring Physician: Patrecia Pour, MD   REASON FOR CONSULTATION:Establishing goals of care  Palliative Care consult requested for goals of care discussion in this 86 y.o. female with multiple medical problems including CVA and hypothyroidism. She presented to the ED from home with complaints of generalized weakness and body aches. It was reported patient received her COVID vaccination 3 days prior to onset of reported symptoms of body aches, chills, and generalized weakness. Symptoms severe causing inability to ambulate.   Clinical Assessment and Goals of Care: I have reviewed medical records including lab results, imaging, Epic notes, and MAR, received report from the bedside RN, and assessed the patient. I met at the bedside with patient and her son/POA Economist) to discuss diagnosis prognosis, GOC, EOL wishes, disposition and options.  Tara Nolan sleeping but easily aroused. She engages in conversation. Denies pain reporting it is worst on standing and with movement.   I introduced Palliative Medicine as specialized medical care for people living with serious illness. It focuses on providing relief from the symptoms and stress of a serious illness. The goal is to improve quality of life for both the patient and the family. Son verbalized understanding and appreciation of our involvement.   We discussed a brief life review of the patient, along with her functional and nutritional status. Patient is a retired Training and development officer. She is widowed and has 2 children (Tom and Hal Hope (who resides in [place])). She enjoys reading and spending time with family.   Tom reports prior to admission patient lived alone and was able to perform most ADLs independently. She was using a wheelchair around the home due to back pain and  weakness. He reports she would use her hands to maneuver chair around the home. Appetite was fair with some decrease over the past month. Tom reports patient was requiring some assistance more often over the past few weeks. His sister recently came down to stay with patient due to some signs of weakness. He reports family were preparing to hire caregivers in the home to offer assistance as he is unable to care for his mom and his sister lives in [place].   We discussed Her current illness and what it means in the larger context of Her on-going co-morbidities. Natural disease trajectory and expectations at EOL were discussed.  Tom verbalized understanding of his mother's current condition. He expresses he and his sister are hopeful she will return to her previous state however, they understand her age. He feels she is rapidly declining however also feels prior to her vaccine she had a decent quality of life. He is tearful in expressing their main goal of their mom being relieved from her pain with hopes that surgery would provide her with some relief. He feels that her pain may have worsened due to her using her arms to maneuver her wheelchair on the carpet causing compression fracture. Therapeutic listening and support given.   I attempted to elicit values and goals of care important to the patient.    The difference between aggressive medical intervention and comfort care was considered in light of the patient's goals of care  Tom expresses goal is to have patient evaluated and hopefully gain relief with surgical intervention if recommended for kyphoplasty or vertebroplasty. While pending evaluation and final decisions  requesting relief via medication management. Tara Nolan reports she feels that medication helps some but she still has significant pain when moving. She and son does feel the gabapentin is providing some relief also.   Discussed changes to medications and use of Fentanyl for breakthrough  pain and scheduled low-dose Tylenol. Son verbalizes agreement. Discussed need for bowl regimen and also possibility of increased sedation given age. Tom verbalized understanding.     Advanced directives, concepts specific to code status, artifical feeding and hydration, and rehospitalization were considered and discussed. Patient does have a documented advanced directive. Her son [name] is her designated HCPOA with support from his siste, Candace. Patient's verbalized and documented wishes are for DNR/DNI. Son shares his mother has made it known she would not want aggressive medical interventions outside of procedure for comfort. He reports on multiple occassions she has reported her comfort in passing when it is her time.   Hospice and Palliative Care services outpatient were explained and offered. Patient and son verbalized their understanding and awareness of both palliative and hospice's goals and philosophy of care. At this time outpatient palliative is requested with awareness they may transition to hospice at any time.  Questions and concerns were addressed.  Hard Choices booklet left for review. The family was encouraged to call with questions or concerns.  PMT will continue to support holistically.   SOCIAL HISTORY:     reports that she has never smoked. She has never used smokeless tobacco. She reports that she does not drink alcohol or use drugs.  CODE STATUS: DNR  ADVANCE DIRECTIVES: Gwenyth Ober (son/HCPOA)   SYMPTOM MANAGEMENT: see below  Palliative Prophylaxis:   Aspiration, Bowel Regimen, Delirium Protocol, Frequent Pain Assessment and Oral Care  PSYCHO-SOCIAL/SPIRITUAL:  Support System: Family  Desire for further Chaplaincy support: NO   Additional Recommendations (Limitations, Scope, Preferences):  Continue to treat the treatable, DNR/DNI   PAST MEDICAL HISTORY: Past Medical History:  Diagnosis Date  . Stroke ([place])   . Thyroid disease     PAST SURGICAL HISTORY:   Past Surgical History:  Procedure Laterality Date  . ABDOMINAL HYSTERECTOMY    . APPENDECTOMY    . HERNIA REPAIR    . THYROIDECTOMY      ALLERGIES:  is allergic to other.   MEDICATIONS:  Current Facility-Administered Medications  Medication Dose Route Frequency Provider Last Rate Last Admin  . acetaminophen (TYLENOL) tablet 325 mg  325 mg Oral Q6H Pickenpack-Cousar, Alyssabeth Bruster N, NP   325 mg at 01/31/20 1601  . baclofen (LIORESAL) tablet 5 mg  5 mg Oral TID Patrecia Pour, MD   5 mg at 01/31/20 1601  . [START ON 02/01/2020] enoxaparin (LOVENOX) injection 30 mg  30 mg Subcutaneous Q24H Brynda Greathouse Sue-Ellen, PA      . gabapentin (NEURONTIN) capsule 100 mg  100 mg Oral TID Etta Quill, DO   100 mg at 01/31/20 1601  . levothyroxine (SYNTHROID) tablet 25 mcg  25 mcg Oral QAC breakfast Patrecia Pour, MD   25 mcg at 01/31/20 0739  . ondansetron (ZOFRAN) tablet 4 mg  4 mg Oral Q6H PRN Etta Quill, DO       Or  . ondansetron [facility]) injection 4 mg  4 mg Intravenous Q6H PRN Etta Quill, DO      . oxyCODONE (Oxy IR/ROXICODONE) immediate release tablet 5-10 mg  5-10 mg Oral Q4H PRN Pickenpack-Cousar, Carlena Sax, NP      . polyethylene glycol (MIRALAX / GLYCOLAX)  packet 17 g  17 g Oral Daily Pickenpack-Cousar, Clarnce Homan N, NP   17 g at 01/31/20 1601  . senna-docusate (Senokot-S) tablet 1 tablet  1 tablet Oral QHS PRN Pickenpack-Cousar, Khalifa Knecht N, NP        VITAL SIGNS: BP 133/70 (BP Location: Right Arm)   Pulse (!) 54   Temp 97.6 F (36.4 C) (Axillary)   Resp 16   Ht '5\' 3"'  (1.6 m)   Wt 43.2 kg   SpO2 97%   BMI 16.87 kg/m  Filed Weights   01/31/20 1604  Weight: 43.2 kg    Estimated body mass index is 16.87 kg/m as calculated from the following:   Height as of this encounter: '5\' 3"'  (1.6 m).   Weight as of this encounter: 43.2 kg.  LABS: CBC:    Component Value Date/Time   WBC 5.1 01/31/2020 0438   HGB 13.1 01/31/2020 0438   HCT 41.9 01/31/2020 0438   PLT 194  01/31/2020 0438   Comprehensive Metabolic Panel:    Component Value Date/Time   NA 137 01/31/2020 0438   K 3.6 01/31/2020 0438   CO2 25 01/31/2020 0438   BUN 10 01/31/2020 0438   CREATININE 0.56 01/31/2020 0438   ALBUMIN 3.9 01/30/2020 1637     Review of Systems  Constitutional: Positive for appetite change.  Musculoskeletal: Positive for back pain.  Neurological: Positive for weakness.   Unless otherwise noted, a complete review of systems is negative.  Physical Exam General: NAD, frail appearing Cardiovascular: regular rate and rhythm Pulmonary: clear ant fields Abdomen: soft, nontender, + bowel sounds Extremities: no edema, no joint deformities Neurological: awake, alert, oriented, follows commands   Prognosis: Guarded   Discharge Planning:  [place] for rehab with Palliative care service follow-up  Recommendations:  DNR/DNI-as confirmed by patient/son  Continue with current plan of care per medical team. No aggressive measures, ok with procedures to reduce pain.  Family remains hopeful for pain relief and some improvement. SNF with rehab and outpatient palliative. Realistic in age and decline in health. Open to hospice if patient shows no signs of meaningful recovery or continued decline in quality of life.   Oxy IR PRN for pain  Fentanyl PRN for breakthrough pain  Continue with gabapentin   Miralax daily for bowel regimen  Senna PRN for constipation  PMT will continue to support and follow    Palliative Performance Scale: PPS 30%               Son expressed understanding and was in agreement with this plan.   Thank you for allowing the Palliative Medicine Team to assist in the care of this patient.  Time In: 1415 Time Out: 1530 Time Total: 75 min.   Visit consisted of counseling and education dealing with the complex and emotionally intense issues of symptom management and palliative care in the setting of serious and potentially  life-threatening illness.Greater than 50%  of this time was spent counseling and coordinating care related to the above assessment and plan.  Signed by:  Alda Lea, AGPCNP-BC Palliative Medicine Team  Phone: [phone] Fax: [phone] Pager: [phone] Amion: Bjorn Pippin

## 2020-01-31 NOTE — Progress Notes (Signed)
Pt son requested that his sister Mack Guise be the designated visitor when she gets into town. She is coming from out of town.

## 2020-01-31 NOTE — Progress Notes (Signed)
OT Cancellation Note  Patient Details Name: Tara Nolan MRN: [id number] DOB: [birth year]   Cancelled Treatment:    Reason Eval/Treat Not Completed: Other (comment). Pt just returned back to bed with nursing after sitting in recliner for a bit. Pt currently with increased back pain, nursing present and aware. Plan to reattempt OT eval tomorrow.   Raynald Kemp, OT Acute Rehabilitation Services Pager: [phone] Office: [phone]  01/31/2020, 2:56 PM

## 2020-02-01 DIAGNOSIS — Z66 Do not resuscitate: Secondary | ICD-10-CM

## 2020-02-01 DIAGNOSIS — Z7189 Other specified counseling: Secondary | ICD-10-CM

## 2020-02-01 DIAGNOSIS — Z515 Encounter for palliative care: Secondary | ICD-10-CM

## 2020-02-01 LAB — URINE CULTURE: Culture: <10000 — AB

## 2020-02-01 LAB — T3, FREE: T3, Free: 1.8 pg/mL — ABNORMAL LOW (ref 2.0–4.4)

## 2020-02-01 MED ORDER — OXYCODONE HCL 5 MG PO TABS: 5.0000 mg | ORAL_TABLET | ORAL | Status: DC | PRN

## 2020-02-01 MED ORDER — GABAPENTIN 100 MG PO CAPS
100.0000 mg | ORAL_CAPSULE | Freq: Two times a day (BID) | ORAL | Status: DC
  Administered 2020-02-04: 100 mg via ORAL
  Filled 2020-02-01 (×2): qty 1

## 2020-02-01 MED ORDER — BACLOFEN 10 MG PO TABS: 5.0000 mg | ORAL_TABLET | Freq: Two times a day (BID) | ORAL | Status: DC | PRN

## 2020-02-01 NOTE — Progress Notes (Signed)
PROGRESS NOTE  Tara Nolan  WUJ:[id number] DOB: December 26, 1918 DOA: 01/30/2020 PCP: Eartha InchBadger, Michael C, Nolan  Brief Narrative: Tara Nolan is a 86 y.o. female with a history of CVA and hypothyroidism who presented from home with severe lower back pain without history of trauma.  ED Course: Tm 100.1.  WBC nl no other SIRS.  CXR neg, COVID neg, flu neg, UA neg. Acute to subacute mild compression fx of L2 and L3, chronic of L1.  Assessment & Plan: Principal Problem:   Compression fracture of lumbar vertebra (HCC) Active Problems:   History of CVA (cerebrovascular accident)   Hypothyroid   Adverse reaction to vaccine, initial encounter  Low back pain acute on chronic due to multiple compression fractures:   - Broad multimodal pain treatment. Pain remains uncontrolled and severely limiting mobility. Tylenol scheduled, baclofen (will make prn only), gabapentin 100mg  decrease to BID, and give oxyIR prn (hasn't required this yet). Heating pad. Bowel regimen ordered. - PT/OT/CSW. Pt's daughter has begun pursuing SNF. Will hopefully be able to rehabilitate if pain control is improved. Kyphoplasty may assist with this. - IR consulted for consideration of kyphoplasty, beginning insurance preauthorization.  Dysphagia: Evidence of some aspiration here, has had this in the past.  - SLP evaluation requested. Goals of care already discussed, would clearly not pursue artificial feeding of any kind and would allow comfort feeding if desired.  Advanced age: Candid conversation with the patient reveals her very persistent desire to avoid suffering. She is not satisfied with her limited mobility and this is now worsened by pain. We discussed the use of pain medications, code status, and consideration of palliative care following the patient during and after hospitalization.   Hypothyroidism: Undertreated, historically has had elevated TSH consistently which was confirmed again during this admission.  -  Start synthroid 5925mcg/day. This is < 0.6 mcg/kg/day.   Immune reaction to covid-19 vaccine: No other sources of infection found.  - Monitor blood culture.   DVT prophylaxis: Lovenox 30mg  Code Status: DNR Family Communication: Daughter at bedside on repeat PM rounds. Disposition Plan: Continue therapy and pain control efforts including kyphoplasty. Will DC to SNF with PT/OT and palliative care following.    Consultants:   Palliative care  IR  Procedures:   None  Antimicrobials:  None   Subjective: Back pain is improved but at the cost of increased sedation. She got to the chair for a couple hourse but has been constantly drowsy and sleeping soundly. She is disoriented.  Objective: Vitals:   01/31/20 2118 02/01/20 0544 02/01/20 1130 02/01/20 1411  BP: 131/68 (!) 152/72  (!) 126/54  Pulse: (!) 57 67  (!) 56  Resp: 15 16  16   Temp: (!) 97.4 F (36.3 C) 97.9 F (36.6 C)  97.9 F (36.6 C)  TempSrc: Oral   Oral  SpO2: 96% 94% 94% 98%  Weight:      Height:        Intake/Output Summary (Last 24 hours) at 02/01/2020 1534 Last data filed at 02/01/2020 1259 Gross per 24 hour  Intake 480 ml  Output 900 ml  Net -420 ml   Gen: Elderly female in no distress Pulm: Nonlabored breathing room air. Clear. CV: Regular rate and rhythm. No murmur, rub, or gallop. No JVD, no dependent edema. GI: Abdomen soft, non-tender, non-distended, with normoactive bowel sounds.  Ext: Warm, no deformities Skin: No rashes, lesions or ulcers on visualized skin. Neuro: Sleeping soundly.    Data Reviewed: I have personally reviewed  following labs and imaging studies  CBC: Recent Labs  Lab 01/30/20 1637 01/30/20 1722 01/31/20 0438  WBC 6.8  --  5.1  NEUTROABS 4.5  --   --   HGB 12.2 13.9 13.1  HCT 37.0 41.0 41.9  MCV 95.4  --  97.9  PLT 195  --  194   Basic Metabolic Panel: Recent Labs  Lab 01/30/20 1637 01/30/20 1722 01/31/20 0438  NA 136 137 137  K 3.4* 3.5 3.6  CL 105 102 106    CO2 24  --  25  GLUCOSE 91 84 74  BUN 13 12 10   CREATININE 0.75 0.60 0.56  CALCIUM 8.4*  --  8.6*   GFR: Estimated Creatinine Clearance: 26.8 mL/min (by C-G formula based on SCr of 0.56 mg/dL). Liver Function Tests: Recent Labs  Lab 01/30/20 1637  AST 23  ALT 14  ALKPHOS 81  BILITOT 0.9  PROT 7.1  ALBUMIN 3.9   Recent Labs  Lab 01/30/20 1637  LIPASE 34   No results for input(s): AMMONIA in the last 168 hours. Coagulation Profile: No results for input(s): INR, PROTIME in the last 168 hours. Cardiac Enzymes: Recent Labs  Lab 01/30/20 1640  CKTOTAL 93   BNP (last 3 results) No results for input(s): PROBNP in the last 8760 hours. HbA1C: No results for input(s): HGBA1C in the last 72 hours. CBG: No results for input(s): GLUCAP in the last 168 hours. Lipid Profile: No results for input(s): CHOL, HDL, LDLCALC, TRIG, CHOLHDL, LDLDIRECT in the last 72 hours. Thyroid Function Tests: Recent Labs    01/30/20 1932 01/31/20 0936  TSH 37.457*  --   FREET4  --  0.57*  T3FREE  --  1.8*   Anemia Panel: No results for input(s): VITAMINB12, FOLATE, FERRITIN, TIBC, IRON, RETICCTPCT in the last 72 hours. Urine analysis:    Component Value Date/Time   COLORURINE STRAW (A) 01/30/2020 1637   APPEARANCEUR CLEAR 01/30/2020 1637   LABSPEC 1.006 01/30/2020 1637   PHURINE 8.0 01/30/2020 1637   GLUCOSEU 50 (A) 01/30/2020 1637   HGBUR NEGATIVE 01/30/2020 1637   BILIRUBINUR NEGATIVE 01/30/2020 1637   KETONESUR NEGATIVE 01/30/2020 1637   PROTEINUR NEGATIVE 01/30/2020 1637   NITRITE NEGATIVE 01/30/2020 1637   LEUKOCYTESUR NEGATIVE 01/30/2020 1637   Recent Results (from the past 240 hour(s))  Blood culture (routine x 2)     Status: None (Preliminary result)   Collection Time: 01/30/20  4:39 PM   Specimen: BLOOD LEFT FOREARM  Result Value Ref Range Status   Specimen Description   Final    BLOOD LEFT FOREARM Performed at [facility], [address]  [place], [place]    Special Requests   Final    BOTTLES DRAWN AEROBIC AND ANAEROBIC Blood Culture results may not be optimal due to an inadequate volume of blood received in culture bottles Performed at [facility], [address], [place], [place]    Culture   Final    NO GROWTH 2 DAYS Performed at [facility], [address] [place], [place]    Report Status PENDING  Incomplete  Urine culture     Status: Abnormal   Collection Time: 01/30/20  4:45 PM   Specimen: Urine, Random  Result Value Ref Range Status   Specimen Description   Final    URINE, RANDOM Performed at [facility], [address], [place], [place]    Special Requests  Final    NONE Performed at [facility], [address], [place], [place] [address]    Culture (A)  Final    <10,000 COLONIES/mL INSIGNIFICANT GROWTH Performed at [facility], [address] [place], [place] [address]    Report Status 02/01/2020 FINAL  Final  Blood culture (routine x 2)     Status: None (Preliminary result)   Collection Time: 01/30/20  5:56 PM   Specimen: BLOOD LEFT HAND  Result Value Ref Range Status   Specimen Description   Final    BLOOD LEFT HAND Performed at [facility], [address], [place], [place] [address]    Special Requests   Final    BOTTLES DRAWN AEROBIC ONLY Blood Culture results may not be optimal due to an inadequate volume of blood received in culture bottles Performed at [facility], [address], [place], [place] [address]    Culture   Final    NO GROWTH 2 DAYS Performed at [facility], [address], [place], [place] [address]    Report Status PENDING  Incomplete  Respiratory Panel by RT PCR (Flu A&B, Covid) - Nasopharyngeal Swab     Status: None   Collection Time: 01/30/20  5:56 PM   Specimen: Nasopharyngeal Swab  Result Value Ref Range Status    SARS Coronavirus 2 by RT PCR NEGATIVE NEGATIVE Final    Comment: (NOTE) SARS-CoV-2 target nucleic acids are NOT DETECTED. The SARS-CoV-2 RNA is generally detectable in upper respiratoy specimens during the acute phase of infection. The lowest concentration of SARS-CoV-2 viral copies this assay can detect is 131 copies/mL. A negative result does not preclude SARS-Cov-2 infection and should not be used as the sole basis for treatment or other patient management decisions. A negative result may occur with  improper specimen collection/handling, submission of specimen other than nasopharyngeal swab, presence of viral mutation(s) within the areas targeted by this assay, and inadequate number of viral copies (<131 copies/mL). A negative result must be combined with clinical observations, patient history, and epidemiological information. The expected result is Negative. Fact Sheet for Patients:  https://www.moore.com/ Fact Sheet for Healthcare Providers:  https://www.young.biz/ This test is not yet ap proved or cleared by the [place] FDA and  has been authorized for detection and/or diagnosis of SARS-CoV-2 by FDA under an Emergency Use Authorization (EUA). This EUA will remain  in effect (meaning this test can be used) for the duration of the COVID-19 declaration under Section 564(b)(1) of the Act, 21 U.S.C. section 360bbb-3(b)(1), unless the authorization is terminated or revoked sooner.    Influenza A by PCR NEGATIVE NEGATIVE Final   Influenza B by PCR NEGATIVE NEGATIVE Final    Comment: (NOTE) The Xpert Xpress SARS-CoV-2/FLU/RSV assay is intended as an aid in  the diagnosis of influenza from Nasopharyngeal swab specimens and  should not be used as a sole basis for treatment. Nasal washings and  aspirates are unacceptable for Xpert Xpress SARS-CoV-2/FLU/RSV  testing. Fact Sheet for Patients: https://www.moore.com/ Fact  Sheet for Healthcare Providers: https://www.young.biz/ This test is not yet approved or cleared by the [place] FDA and  has been authorized for detection and/or diagnosis of SARS-CoV-2 by  FDA under an Emergency Use Authorization (EUA). This EUA will remain  in effect (meaning this test can be used) for the duration of the  Covid-19 declaration under Section 564(b)(1) of the Act, 21  U.S.C. section 360bbb-3(b)(1), unless the authorization is  terminated or revoked. Performed at [facility], [place] [address] [place], [place] [address]       Radiology Studies: CT Head Wo Contrast  Result Date: 01/30/2020 CLINICAL DATA:  Transient ischemic attack. EXAM: CT HEAD WITHOUT CONTRAST TECHNIQUE: Contiguous axial images were obtained from the base of the skull through the vertex without intravenous contrast. COMPARISON:  12/26/2019 FINDINGS: Brain: Stable minimally enlarged ventricles and moderately enlarged subarachnoid spaces involving the cerebral hemispheres and posterior fossa. Stable mild patchy white matter low density in both cerebral hemispheres. No intracranial hemorrhage, mass lesion or CT evidence of acute infarction. Vascular: No hyperdense vessel or unexpected calcification. Skull: Normal. Negative for fracture or focal lesion. Sinuses/Orbits: Stable single opacified right ethmoid air cell. Unremarkable orbits. Other: Bilateral temporomandibular joint degenerative changes. IMPRESSION: 1. No acute abnormality. 2. Stable atrophy and chronic small vessel white matter ischemic changes. 3. Bilateral TMJ degenerative changes. Electronically Signed   By: Claudie Revering M.D.   On: 01/30/2020 18:39   CT ABDOMEN PELVIS W CONTRAST  Result Date: 01/30/2020 CLINICAL DATA:  Diffuse abdominal pain, worse in the back. Difficulty walking. [place] [address] 2 years ago with chronic back pain since then. The difficulty walking started today. The lower back pain is severe. EXAM: CT  ABDOMEN AND PELVIS WITH CONTRAST TECHNIQUE: Multidetector CT imaging of the abdomen and pelvis was performed using the standard protocol following bolus administration of intravenous contrast. CONTRAST:  168mL OMNIPAQUE IOHEXOL 300 MG/ML  SOLN COMPARISON:  12/08/2019 FINDINGS: Lower chest: Stable mild linear scarring at both lung bases. Stable enlarged heart. Hepatobiliary: No focal liver abnormality is seen other than a tiny right lobe cyst. No gallstones, gallbladder wall thickening, or biliary dilatation. Pancreas: Mild diffuse pancreatic ductal dilatation with no obstructing stone or mass seen. Otherwise, unremarkable pancreas. Spleen: Normal in size without focal abnormality. Adrenals/Urinary Tract: Bilateral renal cysts. Normal appearing adrenal glands. Distended urinary bladder. Unremarkable visualized portions of the ureters. No calculi or hydronephrosis seen. Stomach/Bowel: Multiple sigmoid colon diverticula without evidence of diverticulitis. No evidence of appendicitis. Unremarkable stomach and small bowel. Vascular/Lymphatic: Atheromatous arterial calcifications without aneurysm. No enlarged lymph nodes. Reproductive: Status post hysterectomy. No adnexal masses. Other: No abdominal wall hernia or abnormality. No abdominopelvic ascites. Musculoskeletal: Diffuse osteopenia. Stable thoracolumbar scoliosis and degenerative changes. These include facet degenerative changes with stable grade 1 anterolisthesis at the L4-5 level and grade 2 anterolisthesis at the L5-S1 level. Stable L1 and T10 vertebral compression deformities. No acute fractures or subluxations. IMPRESSION: 1. No acute abnormality. 2. Sigmoid diverticulosis. Electronically Signed   By: Claudie Revering M.D.   On: 01/30/2020 18:30   CT L-SPINE NO CHARGE  Result Date: 01/30/2020 CLINICAL DATA:  Diffuse abdominal pain, pain worse in back, difficulty walking, patient reports falling 2 years ago, back pain since, difficulty walking starting today.  Severe low back pain. EXAM: CT LUMBAR SPINE WITHOUT CONTRAST TECHNIQUE: Multidetector CT imaging of the lumbar spine was performed without intravenous contrast administration. Multiplanar CT image reconstructions were also generated. COMPARISON:  Radiographs of the lumbar spine 12/26/2019, lumbar spine CT 11/22/2018, CT abdomen/pelvis 12/08/2019. FINDINGS: Segmentation: 5 lumbar vertebrae. Alignment: Prominent thoracic dextrocurvature. Trace L1 bony retropulsion, unchanged. Redemonstrated L4-L5 and L5-S1 grade 1 anterolisthesis. Vertebrae: Chronic L1 inferior endplate compression deformity without progressive height loss. Unchanged T10 compression fracture there are mild L2 and L3 superior endplate compression deformities which are new as compared to prior CT abdomen/pelvis 12/08/2019. The bones are diffusely demineralized. Paraspinal and other soft tissues: Please refer to CT abdomen/pelvis  separately reported for description of intra-abdominal findings. Atrophy of the lumbar paraspinal musculature. Disc levels: Unless otherwise stated, the level by level findings below have not significantly changed since prior MRI lumbar spine CT 11/22/2018. Multilevel disc degeneration. Most notably there is severe disc degeneration at L3-L4 and moderate/severe disc degeneration at L2-L3. T12-L1: Disc bulge, endplate spurring and mild bony retropulsion at the L1 level. Moderate bilateral bony neural foraminal narrowing. No significant spinal canal stenosis L1-L2: Disc bulge eccentric to the right. Endplate spurring. Mild T1 bony retropulsion. Moderate right neural foraminal narrowing. Mild right subarticular narrowing. Central canal patent L2-L3: Disc bulge asymmetric to the right. Endplate spurring. Mild facet arthrosis/ligamentum flavum hypertrophy. Mild right subarticular and central canal stenosis. Moderate bilateral neural foraminal narrowing. L3-L4: Disc bulge. Endplate spurring. Mild facet arthrosis/ligamentum flavum  hypertrophy. Mild to moderate spinal canal stenosis. Moderate right with moderate/severe left neural foraminal narrowing. L4-L5: Grade 1 anterolisthesis with disc uncovering. Disc bulge. Facet/ligamentum flavum hypertrophy. Suspected at least moderate spinal canal stenosis. Severe bilateral neural foraminal narrowing. L5-S1: Grade 1 anterolisthesis. Disc uncovering. No significant spinal canal stenosis. Moderate bilateral neural foraminal narrowing (greater on the right). IMPRESSION: Mild L2 and L3 superior endplate compression deformities are new as compared to CT abdomen/pelvis 12/08/2019 and may be acute or subacute. Redemonstrated T9 and L1 compression deformities with unchanged mild L1 bony retropulsion. Lumbar spondylosis has not appreciably changed from lumbar spine CT 11/22/2018. Multilevel multifactorial spinal canal stenosis, greatest at L4-L5 (suspected at least moderate at this level). Multilevel neural foraminal narrowing as detailed and greatest on the left at L3-L4 (moderate/severe) and bilaterally at L4-L5 (severe). Please refer to concurrently performed CT abdomen/pelvis for soft tissue findings. Electronically Signed   By: Jackey Loge DO   On: 01/30/2020 18:27   DG Abd Acute W/Chest  Result Date: 01/30/2020 CLINICAL DATA:  Diffuse abdominal pain for the past 2 years. Previous appendectomy and hernia repair as well as abdominal hysterectomy. EXAM: DG ABDOMEN ACUTE W/ 1V CHEST COMPARISON:  Abdomen and pelvis CT dated 12/08/2019 and chest radiographs dated 11/22/2018, 09/07/2017 and 06/20/2012. FINDINGS: Stable mildly enlarged cardiac silhouette and tortuous and partially calcified thoracic aorta. Clear lungs with normal vascularity. Stable biapical pleural and parenchymal scarring. A small calcified granuloma seen in the lateral aspect of the right upper lung zone on 09/07/2017 does not have as well defined margins as previously. Diffuse osteopenia and multiple thoracic and lumbar vertebral  compression deformities with progression. Mild scoliosis. Normal bowel gas pattern without free peritoneal air. Mildly prominent stool in the right colon. No free peritoneal air. IMPRESSION: 1. No acute abnormality. 2. Stable mild cardiomegaly. 3. Diffuse and multiple vertebral compression deformities with progression. Electronically Signed   By: Beckie Salts M.D.   On: 01/30/2020 16:36    Scheduled Meds:  acetaminophen  325 mg Oral Q6H   baclofen  5 mg Oral TID   enoxaparin (LOVENOX) injection  30 mg Subcutaneous Q24H   gabapentin  100 mg Oral TID   levothyroxine  25 mcg Oral QAC breakfast   polyethylene glycol  17 g Oral Daily   Continuous Infusions:   LOS: 1 day   Time spent: 25 minutes.  Tyrone Nine, Nolan [facility] [email or web address] 02/01/2020, 3:34 PM

## 2020-02-01 NOTE — Progress Notes (Signed)
Physical Therapy Treatment Patient Details Name: Tara Nolan MRN: [id number] DOB: Jun 03, 1919 Today's Date: 02/01/2020    History of Present Illness Tara Nolan is a 86 y.o. female with medical history significant of CVA, hypothyroidism. admitted with weakness, found to have  acute to subacute mild compression fx of L2 and L3, chronic of L1.    PT Comments    Pt markedly different today. She is extremely lethargic and is not able to communicate much, Pt dtr states she has had a lot of pain medication. Assisted pt back to bed, dtr supportive and assisting with sit to stand.  Will follow. Pt repeatedly states " I love you" .  May need need to decr frequency once d/c plan is solidified.     Follow Up Recommendations  Home health PT;SNF(?palliative )     Equipment Recommendations  None recommended by PT    Recommendations for Other Services       Precautions / Restrictions Precautions Precautions: Back;Fall Restrictions Weight Bearing Restrictions: No    Mobility  Bed Mobility Overal bed mobility: Needs Assistance Bed Mobility: Sit to Supine       Sit to supine: Total assist   General bed mobility comments: assist with trunk and LEs   Transfers Overall transfer level: Needs assistance Equipment used: 2 person hand held assist Transfers: Sit to/from Stand;Stand Pivot Transfers Sit to Stand: Mod assist;+2 physical assistance;+2 safety/equipment Stand pivot transfers: Mod assist;+2 physical assistance;+2 safety/equipment       General transfer comment: assist to rise and  balance while wt shifting to pivot to chair  Ambulation/Gait                 Stairs             Wheelchair Mobility    Modified Rankin (Stroke Patients Only)       Balance                                            Cognition Arousal/Alertness: Lethargic;Suspect due to medications Behavior During Therapy: WFL for tasks assessed/performed Overall  Cognitive Status: Difficult to assess                                        Exercises      General Comments        Pertinent Vitals/Pain Faces Pain Scale: Hurts even more Pain Location: R flank, back  Pain Descriptors / Indicators: Grimacing;Sore Pain Intervention(s): Limited activity within patient's tolerance;Monitored during session;Premedicated before session;Repositioned    Home Living                      Prior Function            PT Goals (current goals can now be found in the care plan section) Acute Rehab PT Goals Patient Stated Goal: son states goal is rehab PT Goal Formulation: With patient/family Time For Goal Achievement: 02/14/20 Potential to Achieve Goals: Good Progress towards PT goals: Progressing toward goals    Frequency    Min 3X/week      PT Plan Current plan remains appropriate    Co-evaluation              AM-PAC PT "6 Clicks" Mobility   Outcome Measure  Help needed turning from your back to your side while in a flat bed without using bedrails?: Total Help needed moving from lying on your back to sitting on the side of a flat bed without using bedrails?: Total Help needed moving to and from a bed to a chair (including a wheelchair)?: Total Help needed standing up from a chair using your arms (e.g., wheelchair or bedside chair)?: A Lot Help needed to walk in hospital room?: Total Help needed climbing 3-5 steps with a railing? : Total 6 Click Score: 7    End of Session Equipment Utilized During Treatment: Gait belt Activity Tolerance: Patient limited by lethargy;Patient limited by pain Patient left: in bed;with call bell/phone within reach;with bed alarm set;with family/visitor present Nurse Communication: Mobility status PT Visit Diagnosis: Unsteadiness on feet (R26.81);Muscle weakness (generalized) (M62.81)     Time: 7371-0626 PT Time Calculation (min) (ACUTE ONLY): 24 min  Charges:  $Therapeutic  Activity: 23-37 mins                     Colandra Ohanian, PT   Acute Rehab Dept [facility]): [phone]   02/01/2020    [facility] 02/01/2020, 1:07 PM

## 2020-02-01 NOTE — Progress Notes (Signed)
Patient ID: Tara Nolan, female   DOB: 1919-04-08, 86 y.o.   MRN: [id number] Request received for possible L2/3 VP/KP on patient; imaging studies reviewed by Dr. Grace Isaac and patient appears to be suitable candidate for procedure.  Details/risks of procedure briefly discussed with patient/family.  We will tentatively plan case for 3/10 and follow-up with patient on 3/9 for preprocedure orders and additional notes.

## 2020-02-01 NOTE — Progress Notes (Signed)
Daily Progress Note   Patient Name: Tara Nolan       Date: 02/01/2020 DOB: 1919-03-18  Age: 86 y.o. MRN#: [id number] Attending Physician: Patrecia Pour, MD Primary Care Physician: Chesley Noon, MD Admit Date: 01/30/2020  Reason for Consultation/Follow-up: Establishing goals of care and Pain control  Subjective: Patient somewhat drowsy today. Much more awake and alert yesterday. Daughter is at the bedside today (Candace). Reports patient has been sleeping most of the day. Per RN patient had episode of coughing with applesauce. She has not eaten much today. Plans for SLP evaluation.   Introduced myself to daughter. Reviewed goals of care discussion with daughter as requested by patient's son, [name]. Candace shares that her mother has expressed in the past she would not want aggressive measures at her age. She confirms similar goals as patient and son, pain relief, opportunity to improve, however if no improvement or further decline open to a more comfort/hospice approach.   Candace shares memories of her mother and her strength. She shares both she and her brother realize her mother's health has been declining for some time and she will not be able to live alone any longer. They all remain hopeful once she gains pain relief she may be able to participate in rehab, however they are at peace just as she is with main goal again of no suffering and pain. Therapeutic listening and support given.   Patient appears to be resting comfortably and able to sit up in the chair for several hours today. Candace concerned with her coughing episode with eating. Goals are set and clear that patient would not want any forms of artificial feedings or aggressive levels of care.   Questions answered and support  given. Daughter appreciative of care given. Requesting continued support.   Length of Stay: 1  Current Medications: Scheduled Meds:  . acetaminophen  325 mg Oral Q6H  . enoxaparin (LOVENOX) injection  30 mg Subcutaneous Q24H  . [START ON 02/02/2020] gabapentin  100 mg Oral BID  . levothyroxine  25 mcg Oral QAC breakfast  . polyethylene glycol  17 g Oral Daily    Continuous Infusions:   PRN Meds: baclofen, ondansetron **OR** ondansetron (ZOFRAN) IV, oxyCODONE, senna-docusate  Physical Exam     Deferred in the setting of patient resting comfortably.  Vital Signs: BP (!) 126/54 (BP Location: Right Arm)   Pulse (!) 56   Temp 97.9 F (36.6 C) (Oral)   Resp 16   Ht 5\' 3"  (1.6 m)   Wt 43.2 kg   SpO2 98%   BMI 16.87 kg/m  SpO2: SpO2: 98 % O2 Device: O2 Device: Room Air O2 Flow Rate: O2 Flow Rate (L/min): 0 L/min  Intake/output summary:   Intake/Output Summary (Last 24 hours) at 02/01/2020 1958 Last data filed at 02/01/2020 1900 Gross per 24 hour  Intake 120 ml  Output 900 ml  Net -780 ml   LBM: Last BM Date: 01/30/20 Baseline Weight: Weight: 43.2 kg Most recent weight: Weight: 43.2 kg       Palliative Assessment/Data:PPS 20-30%      Patient Active Problem List   Diagnosis Date Noted  . Adverse reaction to vaccine, initial encounter 01/30/2020  . Compression fracture of lumbar vertebra (HCC) 01/30/2020  . Syncope and collapse 11/22/2018  . Back pain 11/22/2018  . Hypothyroid 11/22/2018  . History of CVA (cerebrovascular accident) 09/12/2017  . Fall 09/08/2017  . Right shoulder pain 09/08/2017  . Left wrist fracture 09/08/2017  . History of thyroid disease 09/08/2017  . Chest discomfort 09/08/2017  . Hip pain, acute 06/20/2012  . Knee pain 06/20/2012  . Hamstring tendon rupture 06/20/2012    Palliative Care Assessment & Plan    Recommendations/Plan:  Continue with current plan of care  Pending SLP evaluation and recommendations  Decrease  sedating medications given increased somnolence.   Goal remains as expressed by family pain control, evaluation by IR for intervention, SNF w/rehab and palliative support. No aggressive interventions, no artificial feedings.   PMT will continue to support and follow   Goals of Care and Additional Recommendations:  Limitations on Scope of Treatment: No Artificial Feeding and continue to treat the treatabe, DNR/DNI  Code Status:    Code Status Orders  (From admission, onward)         Start     Ordered   01/30/20 1943  Do not attempt resuscitation (DNR)  Continuous    Question Answer Comment  In the event of cardiac or respiratory ARREST Do not call a "code blue"   In the event of cardiac or respiratory ARREST Do not perform Intubation, CPR, defibrillation or ACLS   In the event of cardiac or respiratory ARREST Use medication by any route, position, wound care, and other measures to relive pain and suffering. May use oxygen, suction and manual treatment of airway obstruction as needed for comfort.      01/30/20 1957        Code Status History    Date Active Date Inactive Code Status Order ID Comments User Context   01/30/2020 1909 01/30/2020 1957 DNR 03/31/2020  [id number], MD ED   11/22/2018 1724 11/27/2018 1738 DNR 01/26/2019  [id number], MD Inpatient   09/08/2017 0322 09/12/2017 1930 DNR 09/14/2017  [id number], MD ED   06/20/2012 1616 06/23/2012 1904 DNR 06/25/2012  [id number], RN Inpatient   Advance Care Planning Activity    Advance Directive Documentation     Most Recent Value  Type of Advance Directive  Out of facility DNR (pink MOST or yellow form)  Pre-existing out of facility DNR order (yellow form or pink MOST form)  Pink MOST form placed in chart (order not valid for inpatient use)  "MOST" Form in Place?  --       Prognosis:  Guarded   Discharge Planning:  Skilled Nursing Facility for rehab with Palliative care service follow-up  Care  plan was discussed with daughter, Sonny Masters and Charity fundraiser.   Thank you for allowing the Palliative Medicine Team to assist in the care of this patient.  Time Total: 50 min.   Visit consisted of counseling and education dealing with the complex and emotionally intense issues of symptom management and palliative care in the setting of serious and potentially life-threatening illness.Greater than 50%  of this time was spent counseling and coordinating care related to the above assessment and plan.  Willette Alma, AGPCNP-BC  Palliative Medicine Team [phone]  Please contact Palliative Medicine Team phone at [phone] for questions and concerns.

## 2020-02-01 NOTE — Progress Notes (Signed)
RN called to bedside. Patients family states that the patient got "choked up" on her applesauce, family states this has happened at home before.   02/01/20 1130  MEWS Score  SpO2 94 %  O2 Device Room Air    RN observed pt seems to occasionally have trouble swallowing and coughs following sips of water. Rn paged MD and MD ordered speech consult. Will continue to monitor patient closely.

## 2020-02-01 NOTE — Evaluation (Signed)
Occupational Therapy Evaluation Patient Details Name: Tara Nolan MRN: [id number] DOB: [birth year] Today's Date: 02/01/2020    History of Present Illness Tara Nolan is a 86 y.o. female with medical history significant of CVA, hypothyroidism. admitted with weakness, found to have  acute to subacute mild compression fx of L2 and L3, chronic of L1.   Clinical Impression   Pt admitted s/p fall with compression fx. Pt currently with functional limitations due to the deficits listed below (see OT Problem List).  Pt will benefit from skilled OT to increase their safety and independence with ADL and functional mobility for ADL to facilitate discharge to venue listed below.      Follow Up Recommendations  SNF    Equipment Recommendations  None recommended by OT    Recommendations for Other Services       Precautions / Restrictions Precautions Precautions: Back;Fall Restrictions Weight Bearing Restrictions: No      Mobility Bed Mobility Overal bed mobility: Needs Assistance Bed Mobility: Supine to Sit;Rolling Rolling: Max assist   Supine to sit: Max assist Sit to supine: Total assist   General bed mobility comments: assist with trunk and LEs   Transfers Overall transfer level: Needs assistance Equipment used: 2 person hand held assist Transfers: Sit to/from Stand;Stand Pivot Transfers Sit to Stand: Max assist Stand pivot transfers: Max assist       General transfer comment: assist to rise and  balance while wt shifting to pivot to chair    Balance Overall balance assessment: Needs assistance;History of Falls Sitting-balance support: No upper extremity supported;Feet supported Sitting balance-Leahy Scale: Fair       Standing balance-Leahy Scale: Poor Standing balance comment: reliant on UE support                           ADL either performed or assessed with clinical judgement   ADL Overall ADL's : Needs assistance/impaired      Grooming: Wash/dry face;Moderate assistance;Sitting                   Toilet Transfer: Maximal assistance;Stand-pivot;Squat-pivot             General ADL Comments: limited ADL eval as pt in pain     Vision Patient Visual Report: No change from baseline              Pertinent Vitals/Pain Pain Assessment: Faces Faces Pain Scale: Hurts even more Pain Location: back Pain Descriptors / Indicators: Grimacing Pain Intervention(s): Limited activity within patient's tolerance;Monitored during session;Repositioned     Hand Dominance     Extremity/Trunk Assessment Upper Extremity Assessment Upper Extremity Assessment: Generalized weakness           Communication     Cognition Arousal/Alertness: Awake/alert Behavior During Therapy: WFL for tasks assessed/performed Overall Cognitive Status: Impaired/Different from baseline                                                Home Living Family/patient expects to be discharged to:: Private residence Living Arrangements: Alone Available Help at Discharge: Family;Available PRN/intermittently Type of Home: House Home Access: Stairs to enter Entrance Stairs-Number of Steps: 1   Home Layout: One level               Home Equipment: Walker - 4 wheels;Walker - 2  wheels;Bedside commode          Prior Functioning/Environment Level of Independence: Independent;Independent with assistive device(s)        Comments: pt reports she transfers to w/c, functions independently from w/c level, sponge bathes        OT Problem List: Decreased activity tolerance;Impaired balance (sitting and/or standing);Decreased safety awareness;Decreased knowledge of use of DME or AE;Pain      OT Treatment/Interventions: Self-care/ADL training;Patient/family education    OT Goals(Current goals can be found in the care plan section) Acute Rehab OT Goals Patient Stated Goal: son states goal is rehab OT Goal  Formulation: With patient Time For Goal Achievement: 02/08/20 Potential to Achieve Goals: Good  OT Frequency: Min 2X/week   Barriers to D/C: Decreased caregiver support             AM-PAC OT "6 Clicks" Daily Activity     Outcome Measure Help from another person eating meals?: A Lot Help from another person taking care of personal grooming?: A Lot Help from another person toileting, which includes using toliet, bedpan, or urinal?: Total Help from another person bathing (including washing, rinsing, drying)?: A Lot Help from another person to put on and taking off regular upper body clothing?: A Lot Help from another person to put on and taking off regular lower body clothing?: Total 6 Click Score: 10   End of Session Nurse Communication: Mobility status  Activity Tolerance: Patient limited by pain Patient left: in chair;with call bell/phone within reach;with family/visitor present  OT Visit Diagnosis: Unsteadiness on feet (R26.81);Other abnormalities of gait and mobility (R26.89);Muscle weakness (generalized) (M62.81);Repeated falls (R29.6)                Time: 7017-7939 OT Time Calculation (min): 21 min Charges:  OT General Charges $OT Visit: 1 Visit OT Evaluation $OT Eval Moderate Complexity: 1 Mod  Kari Baars, [place] [phone] Office- [phone]     Numa Schroeter, Edwena Felty D 02/01/2020, 1:22 PM

## 2020-02-02 LAB — PROTIME-INR
INR: 1.1 (ref 0.8–1.2)
Prothrombin Time: 14.1 seconds (ref 11.4–15.2)

## 2020-02-02 MED ORDER — LEVOTHYROXINE SODIUM 100 MCG/5ML IV SOLN
12.5000 ug | Freq: Every day | INTRAVENOUS | Status: DC
  Administered 2020-02-02 – 2020-02-04 (×3): 12.5 ug via INTRAVENOUS
  Filled 2020-02-02 (×3): qty 5

## 2020-02-02 MED ORDER — CEFAZOLIN SODIUM-DEXTROSE 2-4 GM/100ML-% IV SOLN: 2.0000 g | Freq: Once | INTRAVENOUS | Status: DC

## 2020-02-02 MED ORDER — HYDROMORPHONE HCL 1 MG/ML IJ SOLN
0.2500 mg | INTRAMUSCULAR | Status: DC | PRN
  Administered 2020-02-02 – 2020-02-03 (×2): 0.5 mg via INTRAVENOUS
  Filled 2020-02-02 (×2): qty 0.5

## 2020-02-02 MED ORDER — ENOXAPARIN SODIUM 30 MG/0.3ML ~~LOC~~ SOLN
30.0000 mg | SUBCUTANEOUS | Status: DC
  Administered 2020-02-04: 30 mg via SUBCUTANEOUS
  Filled 2020-02-02: qty 0.3

## 2020-02-02 NOTE — Consult Note (Signed)
Chief Complaint: Back pain   Referring Physician(s): Dr. Laban Emperor  Supervising Physician: Simonne Come  Patient Status: [facility] - In-pt  History of Present Illness: Tara Nolan is a 86 y.o. female History of CVA, remote history of  fall approximately 2 years ago presented to this facility with new onset of lower back pain found to have an old  compression fracture  (T10 and L1) and new compression deformity of L2 and L3 . Per daughter at bedside patient has had chronic back pain for years but was able to ambulate with a walker. Patient's new onset of back pain that has limited her mobility. Daughter states patient has new onset of bilateral lower extremity paresthesia associated with the patient's back pain that daughter reports has had minimal  improvement with gabapentin . Team is requesting kyphoplasty L2 & L3   Past Medical History:  Diagnosis Date  . Stroke (HCC)   . Thyroid disease     Past Surgical History:  Procedure Laterality Date  . ABDOMINAL HYSTERECTOMY    . APPENDECTOMY    . HERNIA REPAIR    . THYROIDECTOMY      Allergies: Other  Medications: Prior to Admission medications   Medication Sig Start Date End Date Taking? Authorizing Provider  gabapentin (NEURONTIN) 100 MG capsule Take 100 mg by mouth 3 (three) times daily.  01/20/20  Yes [provider]     History reviewed. No pertinent family history.  Social History   Socioeconomic History  . Marital status: Widowed    Spouse name: Not on file  . Number of children: Not on file  . Years of education: Not on file  . Highest education level: Not on file  Occupational History  . Not on file  Tobacco Use  . Smoking status: Never Smoker  . Smokeless tobacco: Never Used  Substance and Sexual Activity  . Alcohol use: No  . Drug use: No  . Sexual activity: Never  Other Topics Concern  . Not on file  Social History Narrative  . Not on file   Social Determinants of Health   Financial  Resource Strain:   . Difficulty of Paying Living Expenses: Not on file  Food Insecurity:   . Worried About Programme researcher, broadcasting/film/video in the Last Year: Not on file  . Ran Out of Food in the Last Year: Not on file  Transportation Needs:   . Lack of Transportation (Medical): Not on file  . Lack of Transportation (Non-Medical): Not on file  Physical Activity:   . Days of Exercise per Week: Not on file  . Minutes of Exercise per Session: Not on file  Stress:   . Feeling of Stress : Not on file  Social Connections:   . Frequency of Communication with Friends and Family: Not on file  . Frequency of Social Gatherings with Friends and Family: Not on file  . Attends Religious Services: Not on file  . Active Member of Clubs or Organizations: Not on file  . Attends Banker Meetings: Not on file  . Marital Status: Not on file     Review of Systems: A 12 point ROS discussed and pertinent positives are indicated in the HPI above.  All other systems are negative.  Review of Systems  Constitutional: Positive for fatigue. Negative for fever.  HENT: Negative for congestion.   Respiratory: Negative for cough and shortness of breath.   Gastrointestinal: Negative for abdominal pain, diarrhea, nausea and vomiting.  Musculoskeletal:  Positive for back pain ( pin point tenderness to L2 L3  with bilateral lower extremity parathesia. ).    Vital Signs: BP (!) 184/97 (BP Location: Left Arm)   Pulse (!) 102   Temp 97.8 F (36.6 C) (Oral)   Resp 14   Ht 5\' 3"  (1.6 m)   Wt 95 lb 3.8 oz (43.2 kg)   SpO2 98%   BMI 16.87 kg/m   Physical Exam Vitals and nursing note reviewed.  Constitutional:      Appearance: She is well-developed.  HENT:     Head: Normocephalic and atraumatic.  Eyes:     Conjunctiva/sclera: Conjunctivae normal.  Cardiovascular:     Rate and Rhythm: Normal rate and regular rhythm.     Heart sounds: Normal heart sounds.  Pulmonary:     Effort: Pulmonary effort is normal.       Breath sounds: Normal breath sounds.  Musculoskeletal:        General: Tenderness ( pinpoint tenderness L1-L3. ) present.     Cervical back: Normal range of motion.  Skin:    General: Skin is warm.  Neurological:     Mental Status: She is alert. Mental status is at baseline.     Comments: Baseline per daughter     Imaging: CT Head Wo Contrast  Result Date: 01/30/2020 CLINICAL DATA:  Transient ischemic attack. EXAM: CT HEAD WITHOUT CONTRAST TECHNIQUE: Contiguous axial images were obtained from the base of the skull through the vertex without intravenous contrast. COMPARISON:  12/26/2019 FINDINGS: Brain: Stable minimally enlarged ventricles and moderately enlarged subarachnoid spaces involving the cerebral hemispheres and posterior fossa. Stable mild patchy white matter low density in both cerebral hemispheres. No intracranial hemorrhage, mass lesion or CT evidence of acute infarction. Vascular: No hyperdense vessel or unexpected calcification. Skull: Normal. Negative for fracture or focal lesion. Sinuses/Orbits: Stable single opacified right ethmoid air cell. Unremarkable orbits. Other: Bilateral temporomandibular joint degenerative changes. IMPRESSION: 1. No acute abnormality. 2. Stable atrophy and chronic small vessel white matter ischemic changes. 3. Bilateral TMJ degenerative changes. Electronically Signed   By: 12/28/2019 M.D.   On: 01/30/2020 18:39   CT ABDOMEN PELVIS W CONTRAST  Result Date: 01/30/2020 CLINICAL DATA:  Diffuse abdominal pain, worse in the back. Difficulty walking. 03/31/2020 2 years ago with chronic back pain since then. The difficulty walking started today. The lower back pain is severe. EXAM: CT ABDOMEN AND PELVIS WITH CONTRAST TECHNIQUE: Multidetector CT imaging of the abdomen and pelvis was performed using the standard protocol following bolus administration of intravenous contrast. CONTRAST:  Larey Seat OMNIPAQUE IOHEXOL 300 MG/ML  SOLN COMPARISON:  12/08/2019 FINDINGS: Lower  chest: Stable mild linear scarring at both lung bases. Stable enlarged heart. Hepatobiliary: No focal liver abnormality is seen other than a tiny right lobe cyst. No gallstones, gallbladder wall thickening, or biliary dilatation. Pancreas: Mild diffuse pancreatic ductal dilatation with no obstructing stone or mass seen. Otherwise, unremarkable pancreas. Spleen: Normal in size without focal abnormality. Adrenals/Urinary Tract: Bilateral renal cysts. Normal appearing adrenal glands. Distended urinary bladder. Unremarkable visualized portions of the ureters. No calculi or hydronephrosis seen. Stomach/Bowel: Multiple sigmoid colon diverticula without evidence of diverticulitis. No evidence of appendicitis. Unremarkable stomach and small bowel. Vascular/Lymphatic: Atheromatous arterial calcifications without aneurysm. No enlarged lymph nodes. Reproductive: Status post hysterectomy. No adnexal masses. Other: No abdominal wall hernia or abnormality. No abdominopelvic ascites. Musculoskeletal: Diffuse osteopenia. Stable thoracolumbar scoliosis and degenerative changes. These include facet degenerative changes with stable grade 1 anterolisthesis at the  L4-5 level and grade 2 anterolisthesis at the L5-S1 level. Stable L1 and T10 vertebral compression deformities. No acute fractures or subluxations. IMPRESSION: 1. No acute abnormality. 2. Sigmoid diverticulosis. Electronically Signed   By: Beckie SaltsSteven  Reid M.D.   On: 01/30/2020 18:30   CT L-SPINE NO CHARGE  Result Date: 01/30/2020 CLINICAL DATA:  Diffuse abdominal pain, pain worse in back, difficulty walking, patient reports falling 2 years ago, back pain since, difficulty walking starting today. Severe low back pain. EXAM: CT LUMBAR SPINE WITHOUT CONTRAST TECHNIQUE: Multidetector CT imaging of the lumbar spine was performed without intravenous contrast administration. Multiplanar CT image reconstructions were also generated. COMPARISON:  Radiographs of the lumbar spine  12/26/2019, lumbar spine CT 11/22/2018, CT abdomen/pelvis 12/08/2019. FINDINGS: Segmentation: 5 lumbar vertebrae. Alignment: Prominent thoracic dextrocurvature. Trace L1 bony retropulsion, unchanged. Redemonstrated L4-L5 and L5-S1 grade 1 anterolisthesis. Vertebrae: Chronic L1 inferior endplate compression deformity without progressive height loss. Unchanged T10 compression fracture there are mild L2 and L3 superior endplate compression deformities which are new as compared to prior CT abdomen/pelvis 12/08/2019. The bones are diffusely demineralized. Paraspinal and other soft tissues: Please refer to CT abdomen/pelvis separately reported for description of intra-abdominal findings. Atrophy of the lumbar paraspinal musculature. Disc levels: Unless otherwise stated, the level by level findings below have not significantly changed since prior MRI lumbar spine CT 11/22/2018. Multilevel disc degeneration. Most notably there is severe disc degeneration at L3-L4 and moderate/severe disc degeneration at L2-L3. T12-L1: Disc bulge, endplate spurring and mild bony retropulsion at the L1 level. Moderate bilateral bony neural foraminal narrowing. No significant spinal canal stenosis L1-L2: Disc bulge eccentric to the right. Endplate spurring. Mild T1 bony retropulsion. Moderate right neural foraminal narrowing. Mild right subarticular narrowing. Central canal patent L2-L3: Disc bulge asymmetric to the right. Endplate spurring. Mild facet arthrosis/ligamentum flavum hypertrophy. Mild right subarticular and central canal stenosis. Moderate bilateral neural foraminal narrowing. L3-L4: Disc bulge. Endplate spurring. Mild facet arthrosis/ligamentum flavum hypertrophy. Mild to moderate spinal canal stenosis. Moderate right with moderate/severe left neural foraminal narrowing. L4-L5: Grade 1 anterolisthesis with disc uncovering. Disc bulge. Facet/ligamentum flavum hypertrophy. Suspected at least moderate spinal canal stenosis. Severe  bilateral neural foraminal narrowing. L5-S1: Grade 1 anterolisthesis. Disc uncovering. No significant spinal canal stenosis. Moderate bilateral neural foraminal narrowing (greater on the right). IMPRESSION: Mild L2 and L3 superior endplate compression deformities are new as compared to CT abdomen/pelvis 12/08/2019 and may be acute or subacute. Redemonstrated T9 and L1 compression deformities with unchanged mild L1 bony retropulsion. Lumbar spondylosis has not appreciably changed from lumbar spine CT 11/22/2018. Multilevel multifactorial spinal canal stenosis, greatest at L4-L5 (suspected at least moderate at this level). Multilevel neural foraminal narrowing as detailed and greatest on the left at L3-L4 (moderate/severe) and bilaterally at L4-L5 (severe). Please refer to concurrently performed CT abdomen/pelvis for soft tissue findings. Electronically Signed   By: Jackey LogeKyle  Golden DO   On: 01/30/2020 18:27   DG Abd Acute W/Chest  Result Date: 01/30/2020 CLINICAL DATA:  Diffuse abdominal pain for the past 2 years. Previous appendectomy and hernia repair as well as abdominal hysterectomy. EXAM: DG ABDOMEN ACUTE W/ 1V CHEST COMPARISON:  Abdomen and pelvis CT dated 12/08/2019 and chest radiographs dated 11/22/2018, 09/07/2017 and 06/20/2012. FINDINGS: Stable mildly enlarged cardiac silhouette and tortuous and partially calcified thoracic aorta. Clear lungs with normal vascularity. Stable biapical pleural and parenchymal scarring. A small calcified granuloma seen in the lateral aspect of the right upper lung zone on 09/07/2017 does not have as well defined  margins as previously. Diffuse osteopenia and multiple thoracic and lumbar vertebral compression deformities with progression. Mild scoliosis. Normal bowel gas pattern without free peritoneal air. Mildly prominent stool in the right colon. No free peritoneal air. IMPRESSION: 1. No acute abnormality. 2. Stable mild cardiomegaly. 3. Diffuse and multiple vertebral  compression deformities with progression. Electronically Signed   By: Claudie Revering M.D.   On: 01/30/2020 16:36    Labs:  CBC: Recent Labs    12/02/19 0847 12/02/19 0847 12/08/19 1133 01/30/20 1637 01/30/20 1722 01/31/20 0438  WBC 3.7*  --  6.9 6.8  --  5.1  HGB 11.3*   < > 12.7 12.2 13.9 13.1  HCT 35.0*   < > 39.2 37.0 41.0 41.9  PLT 204  --  241 195  --  194   < > = values in this interval not displayed.    COAGS: Recent Labs    02/02/20 0925  INR 1.1    BMP: Recent Labs    12/02/19 0847 12/02/19 0847 12/08/19 1133 01/30/20 1637 01/30/20 1722 01/31/20 0438  NA 137   < > 139 136 137 137  K 3.8   < > 3.8 3.4* 3.5 3.6  CL 102   < > 104 105 102 106  CO2 24  --  25 24  --  25  GLUCOSE 87   < > 80 91 84 74  BUN 19   < > 15 13 12 10   CALCIUM 8.8*  --  8.8* 8.4*  --  8.6*  CREATININE 0.72   < > 0.70 0.75 0.60 0.56  GFRNONAA >60  --  >60 >60  --  >60  GFRAA >60  --  >60 >60  --  >60   < > = values in this interval not displayed.    LIVER FUNCTION TESTS: Recent Labs    12/02/19 0847 12/08/19 1133 01/30/20 1637  BILITOT 0.8 0.9 0.9  AST 28 24 23   ALT 15 13 14   ALKPHOS 68 79 81  PROT 7.3 7.8 7.1  ALBUMIN 4.2 4.3 3.9    TUMOR MARKERS: No results for input(s): AFPTM, CEA, CA199, CHROMGRNA in the last 8760 hours.  Assessment and Plan:  86 y.o, female inpatient. History of CVA, remote history of  fall approximately 2 years ago presented to this facility with new onset of lower back pain found to have an old  compression fracture  (T10 and L1) and new compression deformity of L2 and L3 . Per daughter at bedside patient has had chronic back pain for years but was able to ambulate with a walker. Patient's new onset of back pain that has limited her mobility. Daughter states patient has new onset of bilateral lower extremity paresthesia associated with the patient's back pain. Team is requesting kyphoplasty L2 & L3  Pertinent Imaging 3.6.21 - CT Spine reads  Unchanged T10 compression fracture there are mild L2 and L3 superior endplate compression deformities which are new as compared to prior CT abdomen/pelvis 12/08/2019. The bones are diffusely demineralized.  Pertinent IR History none  Pertinent Allergies None  All labs are within acceptable parameters.  Patient is afebrile.Patient is on subcutaneous prophylactic dose of lovenox.    Patient tentatively scheduled for procedure for 3.10.21. Team instructed to: Keep Patient to be NPO after midnight Hold prophylactic anticoagulation at midnight  IR will call patient when ready.  . Risks and benefits of Kyphoplasty were discussed with the patient including, but not limited to education regarding the natural  healing process of compression fractures without intervention, bleeding, infection, cement migration which may cause spinal cord damage, paralysis, pulmonary embolism or even death.  This interventional procedure involves the use of X-rays and because of the nature of the planned procedure, it is possible that we will have prolonged use of X-ray fluoroscopy.  Potential radiation risks to you include (but are not limited to) the following: - A slightly elevated risk for cancer  several years later in life. This risk is typically less than 0.5% percent. This risk is low in comparison to the normal incidence of human cancer, which is 33% for women and 50% for men according to the [employer] Society. - Radiation induced injury can include skin redness, resembling a rash, tissue breakdown / ulcers and hair loss (which can be temporary or permanent).   The likelihood of either of these occurring depends on the difficulty of the procedure and whether you are sensitive to radiation due to previous procedures, disease, or genetic conditions.   IF your procedure requires a prolonged use of radiation, you will be notified and given written instructions for further action.  It is your  responsibility to monitor the irradiated area for the 2 weeks following the procedure and to notify your physician if you are concerned that you have suffered a radiation induced injury.    All of the patient's questions were answered, patient and patient's daughter is agreeable to proceed.  Consent signed and in chart.    Thank you for this interesting consult.  I greatly enjoyed meeting NIRVI BOEHLER and look forward to participating in their care.  A copy of this report was sent to the requesting provider on this date.  Electronically Signed: Marletta Lor, NP 02/02/2020, 2:33 PM   I spent a total of 40 Minutes   in face to face in clinical consultation, greater than 50% of which was counseling/coordinating care for kyphoplasty L2 and L3

## 2020-02-02 NOTE — Evaluation (Signed)
Clinical/Bedside Swallow Evaluation Patient Details  Name: Tara Nolan MRN: [id number] Date of Birth: [birth year]  Today's Date: 02/02/2020 Time: SLP Start Time (ACUTE ONLY): 0840 SLP Stop Time (ACUTE ONLY): 0910 SLP Time Calculation (min) (ACUTE ONLY): 30 min  Past Medical History:  Past Medical History:  Diagnosis Date  . Stroke ([place])   . Thyroid disease    Past Surgical History:  Past Surgical History:  Procedure Laterality Date  . ABDOMINAL HYSTERECTOMY    . APPENDECTOMY    . HERNIA REPAIR    . THYROIDECTOMY     HPI:  86 yo female adm to [facility] with with severe uncontrolled pain from vertebral compression fractures of lumbar spine. Pt has h/o CVA.  Plans are for pain control including possible kyphoplasty and to dc to SNF.  Swallow evaluation ordered due to concerns for dysphagia.  Per chart, pt has a MOST form that indicates no feeding tube.RN reports pt is not swallowing po medications, allows them to sit in her mouth and they leak anterior onto her gown.   Assessment / Plan / Recommendation Clinical Impression  Patient currently presents with clinical indications of severe multifactorial dysphagia. Cognitive based and sensorimotor deficits present clinically.  Pt attempts to follow directions - such as lifting her head- but she did not protrude her tongue or smile - causing SLP to suspect some motor planning deficits are present.    Provided her with minimal intake including tsp, very small cup boluses of thin water, gingerale and icecream.  Total of 5 boluses provided - Decreased labial seal and prolonged oral holding with delayed swallow present.  Pt swallowed ONLY 3 times during ALL intake.  She demonstrated continued effort to swallow evidenced by submental musculature/pharyngeal contraction observed but was not successful. Trials of dry spoon stimulation to tongue did not elicit swallow.  Providing trace amount more likely allowed spillage into pharynx with subsequently  delayed swallow.    SLP suspects pt has pharyngel retention that she is unable to clear and she did verbalize "I don't know if I'm ok" after just few boluses.  This pt will not be able to meet nutritional needs and comfort intake for her will be just a few tsps of full liquids - removing bolus if she does not swallow.  Pt helping "hand over hand' improved her willingness to open oral cavity to accept po.   If this swallow does not improve, she will absolutely not meet hydration/nutritional nor medication needs.     Did not provide pt with solid or puree po due to concern for pharyngeal retention and gross aspiration with this frail pt.  SLP spoke to MD and RN re: concerns and modified diet.  Will sign off at this time, thanks for the consult.  If any po is desired, it should be for comfort only and anticipate only few tsps will be consumed - if any.    Thankful palliative is involved as great concerns present for pt to "rehab" without ability to consume adequate po.  SLP Visit Diagnosis: Dysphagia, oropharyngeal phase (R13.12)    Aspiration Risk  Severe aspiration risk;Risk for inadequate nutrition/hydration    Diet Recommendation (full liquids only, no pudding/grits)   Liquid Administration via: Spoon Medication Administration: Via alternative means Supervision: Staff to assist with self feeding Compensations: Slow rate;Small sips/bites(comforrt feeding, few boluses only) Postural Changes: Seated upright at 90 degrees;Remain upright for at least 30 minutes after po intake    Other  Recommendations Oral Care Recommendations: Oral care  BID Other Recommendations: Other (Comment)   Follow up Recommendations None      Frequency and Duration            Prognosis        Swallow Study   General Date of Onset: 02/02/20 HPI: 86 yo female adm to [facility] with with severe uncontrolled pain from vertebral compression fractures of lumbar spine. Pt has h/o CVA.  Plans are for pain control  including possible kyphoplasty and to dc to SNF.  Swallow evaluation ordered due to concerns for dysphagia.  Per chart, pt has a MOST form that indicates no feeding tube.RN reports pt is not swallowing po medications, allows them to sit in her mouth and they leak anterior onto her gown. Type of Study: Bedside Swallow Evaluation Diet Prior to this Study: Regular;Thin liquids Respiratory Status: Room air Behavior/Cognition: Alert;Doesn't follow directions Oral Cavity Assessment: Within Functional Limits Oral Care Completed by SLP: No Oral Cavity - Dentition: Dentures, top Vision: Impaired for self-feeding Self-Feeding Abilities: Total assist Patient Positioning: Upright in bed Baseline Vocal Quality: Low vocal intensity Volitional Cough: Cognitively unable to elicit Volitional Swallow: Unable to elicit    Oral/Motor/Sensory Function Overall Oral Motor/Sensory Function: Generalized oral weakness(pt did not follow directions for ome, able to only partially close lips on spoon)   Ice Chips Ice chips: Not tested   Thin Liquid Thin Liquid: Impaired Presentation: Spoon;Cup Oral Phase Impairments: Poor awareness of bolus;Reduced labial seal;Reduced lingual movement/coordination Oral Phase Functional Implications: Oral holding;Prolonged oral transit Pharyngeal  Phase Impairments: Suspected delayed Swallow;Multiple swallows    Nectar Thick Nectar Thick Liquid: Not tested   Honey Thick Honey Thick Liquid: Not tested   Puree Puree: Impaired Presentation: Spoon Oral Phase Impairments: Reduced labial seal;Poor awareness of bolus;Reduced lingual movement/coordination Oral Phase Functional Implications: Prolonged oral transit Pharyngeal Phase Impairments: Multiple swallows Other Comments: icecream only   Solid     Solid: Not tested      Chales Abrahams 02/02/2020,10:44 AM    Rolena Infante, MS [facility] SLP Acute Rehab Services Office [phone]

## 2020-02-02 NOTE — [place] FL2 (Signed)
[place] MEDICAID FL2 LEVEL OF CARE SCREENING TOOL     IDENTIFICATION  Patient Name: Tara Nolan Birthdate: Jul 04, 1919 Sex: female Admission Date (Current Location): 01/30/2020  [facility] and [place] Number:  Producer, television/film/video and Address:  [facility],  [address] [place], [place] [address]      Provider Number: [id number]  Attending Physician Name and Address:  Tyrone Nine, MD  Relative Name and Phone Number:       Current Level of Care: Hospital Recommended Level of Care: Skilled Nursing Facility Prior Approval Number:    Date Approved/Denied:   PASRR Number: [id number]  Discharge Plan: SNF    Current Diagnoses: Patient Active Problem List   Diagnosis Date Noted  . Adverse reaction to vaccine, initial encounter 01/30/2020  . Compression fracture of lumbar vertebra (HCC) 01/30/2020  . Syncope and collapse 11/22/2018  . Back pain 11/22/2018  . Hypothyroid 11/22/2018  . History of CVA (cerebrovascular accident) 09/12/2017  . Fall 09/08/2017  . Right shoulder pain 09/08/2017  . Left wrist fracture 09/08/2017  . History of thyroid disease 09/08/2017  . Chest discomfort 09/08/2017  . Hip pain, acute 06/20/2012  . Knee pain 06/20/2012  . Hamstring tendon rupture 06/20/2012    Orientation RESPIRATION BLADDER Height & Weight     Self  Normal Continent Weight: 43.2 kg Height:  5\' 3"  (160 cm)  BEHAVIORAL SYMPTOMS/MOOD NEUROLOGICAL BOWEL NUTRITION STATUS      Continent Diet(regular)  AMBULATORY STATUS COMMUNICATION OF NEEDS Skin   Supervision Verbally Normal                       Personal Care Assistance Level of Assistance              Functional Limitations Info  Sight, Speech, Hearing Sight Info: Adequate Hearing Info: Adequate Speech Info: Adequate    SPECIAL CARE FACTORS FREQUENCY  PT (By licensed PT)     PT Frequency: 5 x weekly              Contractures Contractures Info: Not present    Additional Factors  Info  Code Status Code Status Info: dnr             Current Medications (02/02/2020):  This is the current hospital active medication list Current Facility-Administered Medications  Medication Dose Route Frequency Provider Last Rate Last Admin  . acetaminophen (TYLENOL) tablet 325 mg  325 mg Oral Q6H Pickenpack-Cousar, Athena N, NP   325 mg at 02/01/20 1816  . baclofen (LIORESAL) tablet 5 mg  5 mg Oral BID PRN 04/02/20, MD      . enoxaparin (LOVENOX) injection 30 mg  30 mg Subcutaneous Q24H Tyrone Nine Sue-Ellen, PA   30 mg at 02/01/20 1044  . gabapentin (NEURONTIN) capsule 100 mg  100 mg Oral BID 04/02/20, MD      . levothyroxine (SYNTHROID) tablet 25 mcg  25 mcg Oral QAC breakfast Tyrone Nine, MD   25 mcg at 02/02/20 0815  . ondansetron (ZOFRAN) tablet 4 mg  4 mg Oral Q6H PRN 04/03/20, DO       Or  . ondansetron [facility]) injection 4 mg  4 mg Intravenous Q6H PRN [facility], DO      . oxyCODONE (Oxy IR/ROXICODONE) immediate release tablet 5 mg  5 mg Oral Q4H PRN Hillary Bow, MD      . polyethylene glycol (MIRALAX / GLYCOLAX) packet 17  g  17 g Oral Daily Pickenpack-Cousar, Athena N, NP   17 g at 02/01/20 1052  . senna-docusate (Senokot-S) tablet 1 tablet  1 tablet Oral QHS PRN Pickenpack-Cousar, Carlena Sax, NP         Discharge Medications: Please see discharge summary for a list of discharge medications.  Relevant Imaging Results:  Relevant Lab Results:   Additional Information SS#: [id number]  Leeroy Cha, RN

## 2020-02-02 NOTE — TOC Initial Note (Addendum)
Transition of Care [facility]) - Initial/Assessment Note    Patient Details  Name: Tara Nolan MRN: [id number] Date of Birth: [birth year]  Transition of Care [facility]) CM/SW Contact:    Golda Acre, RN Phone Number: 02/02/2020, 9:42 AM  Clinical Narrative:                 fl2 sent out to area snf for reivew. tct-Candy Daughter-Has chosen [facility] for rehab after procedure tomorrow. Will discuss with the brother and call back to confirm.tcf-duaghter confirms for patient to go to [facility]. tcf-Kenisha Closs at [facility] can take tomorrow. Expected Discharge Plan: Skilled Nursing Facility Barriers to Discharge: SNF Pending bed offer   Patient Goals and CMS Choice Patient states their goals for this hospitalization and ongoing recovery are:: unable to state CMS Medicare.gov Compare Post Acute Care list provided to:: Patient Choice offered to / list presented to : Adult Children  Expected Discharge Plan and Services Expected Discharge Plan: Skilled Nursing Facility   Discharge Planning Services: CM Consult Post Acute Care Choice: Skilled Nursing Facility Living arrangements for the past 2 months: Single Family Home                                      Prior Living Arrangements/Services Living arrangements for the past 2 months: Single Family Home Lives with:: Adult Children Patient language and need for interpreter reviewed:: No Do you feel safe going back to the place where you live?: Yes      Need for Family Participation in Patient Care: Yes (Comment) Care giver support system in place?: Yes (comment)   Criminal Activity/Legal Involvement Pertinent to Current Situation/Hospitalization: No - Comment as needed  Activities of Daily Living Home Assistive Devices/Equipment: Eyeglasses, Hearing aid ADL Screening (condition at time of admission) Patient's cognitive ability adequate to safely complete daily activities?: Yes Is the patient deaf or have  difficulty hearing?: Yes Does the patient have difficulty seeing, even when wearing glasses/contacts?: No Does the patient have difficulty concentrating, remembering, or making decisions?: No Patient able to express need for assistance with ADLs?: Yes Does the patient have difficulty dressing or bathing?: Yes Independently performs ADLs?: No Communication: Independent Dressing (OT): Needs assistance Is this a change from baseline?: Pre-admission baseline Grooming: Needs assistance Is this a change from baseline?: Pre-admission baseline Feeding: Needs assistance Is this a change from baseline?: Pre-admission baseline Bathing: Needs assistance Is this a change from baseline?: Pre-admission baseline Toileting: Needs assistance, Dependent Is this a change from baseline?: Pre-admission baseline In/Out Bed: Dependent Is this a change from baseline?: Pre-admission baseline Walks in Home: Independent with device (comment) Does the patient have difficulty walking or climbing stairs?: Yes Weakness of Legs: None Weakness of Arms/Hands: None  Permission Sought/Granted                  Emotional Assessment Appearance:: Appears stated age     Orientation: : Oriented to Self, Oriented to Place Alcohol / Substance Use: Not Applicable Psych Involvement: No (comment)  Admission diagnosis:  Back pain [M54.9] Compression fracture of lumbar vertebra (HCC) [S32.000A] Compression fracture of L2 vertebra, initial encounter (HCC) [S32.020A] Patient Active Problem List   Diagnosis Date Noted  . Adverse reaction to vaccine, initial encounter 01/30/2020  . Compression fracture of lumbar vertebra (HCC) 01/30/2020  . Syncope and collapse 11/22/2018  . Back pain 11/22/2018  . Hypothyroid 11/22/2018  . History  of CVA (cerebrovascular accident) 09/12/2017  . Fall 09/08/2017  . Right shoulder pain 09/08/2017  . Left wrist fracture 09/08/2017  . History of thyroid disease 09/08/2017  . Chest  discomfort 09/08/2017  . Hip pain, acute 06/20/2012  . Knee pain 06/20/2012  . Hamstring tendon rupture 06/20/2012   PCP:  Chesley Noon, MD Pharmacy:   [id number] - [place], [place] [address] Phone: [phone] Fax: [phone]     Social Determinants of Health (SDOH) Interventions    Readmission Risk Interventions No flowsheet data found.

## 2020-02-02 NOTE — Progress Notes (Signed)
PROGRESS NOTE  Tara Nolan  XTK:[id number] DOB: 1919-10-05 DOA: 01/30/2020 PCP: [name], MD  Brief Narrative: Tara Nolan is a 86 y.o. female with a history of CVA and hypothyroidism who presented from home with severe lower back pain without history of trauma.  ED Course: Tm 100.1.  WBC nl no other SIRS.  CXR neg, COVID neg, flu neg, UA neg. Acute to subacute mild compression fx of L2 and L3, chronic of L1.  Assessment & Plan: Principal Problem:   Compression fracture of lumbar vertebra (HCC) Active Problems:   History of CVA (cerebrovascular accident)   Hypothyroid   Adverse reaction to vaccine, initial encounter  Low back pain acute on chronic due to multiple compression fractures:   - Broad multimodal pain treatment as ordered, attempting to minimize sedation (hold sedating medications for sedation): Tylenol scheduled, baclofen (prn only), gabapentin 100mg , heating pad. Bowel regimen ordered. - PT/OT/CSW, plan to pursue SNF. Will hopefully be able to rehabilitate if pain control is improved.  - IR consulted, planning vertebroplasty 3/10. NPO p MN.  Dysphagia: Evidence of some aspiration here, has had this in the past.  - SLP evaluation requested. Goals of care already discussed, would clearly not pursue artificial feeding of any kind and would allow comfort feeding if desired.  Advanced age: Candid conversation with the patient reveals her very persistent desire to avoid suffering. She is not satisfied with her limited mobility and this is now worsened by pain. We discussed the use of pain medications, code status, and consideration of palliative care following the patient during and after hospitalization.   Hypothyroidism: Undertreated, historically has had elevated TSH consistently which was confirmed again during this admission. The patient had repeatedly decided not to take treatment.  - Started synthroid 21mcg/day. This is < 0.6 mcg/kg/day. Asked family to  discuss whether they'd like to continue this or allow hypothyroidism to go untreated further.  Immune reaction to covid-19 vaccine: No other sources of infection found.  - Monitor blood culture.   DVT prophylaxis: Lovenox 30mg  Code Status: DNR Family Communication: Daughter by phone  Disposition Plan: Continue therapy and pain control efforts including kyphoplasty. Will DC to SNF with PT/OT and palliative care following.    Consultants:   Palliative care  IR  Procedures:   None  Antimicrobials:  None   Subjective: More alert this morning, repeating phrases without redirectability. Confused.  Objective: Vitals:   02/01/20 1411 02/01/20 2024 02/02/20 0400 02/02/20 1414  BP: (!) 126/54 117/65 (!) 189/77 (!) 184/97  Pulse: (!) 56 (!) 55 74 (!) 102  Resp: 16 18 14 14   Temp: 97.9 F (36.6 C) (!) 97.3 F (36.3 C) 97.7 F (36.5 C) 97.8 F (36.6 C)  TempSrc: Oral Oral Oral Oral  SpO2: 98% 97% 96% 98%  Weight:      Height:        Intake/Output Summary (Last 24 hours) at 02/02/2020 1433 Last data filed at 02/02/2020 0257 Gross per 24 hour  Intake 0 ml  Output 275 ml  Net -275 ml   Gen: Elderly female in no distress Pulm: Nonlabored breathing room air. Clear. CV: Regular rate and rhythm. No murmur, rub, or gallop. No JVD, no dependent edema. GI: Abdomen soft, non-tender, non-distended, with normoactive bowel sounds.  Ext: Warm, no deformities Skin: No rashes, lesions or ulcers on visualized skin. Neuro: Alert, interactive, disoriented, unable to follow directions for neuro exam. Psych: Judgement and insight appear impaired.   Data Reviewed: I have  personally reviewed following labs and imaging studies  CBC: Recent Labs  Lab 01/30/20 1637 01/30/20 1722 01/31/20 0438  WBC 6.8  --  5.1  NEUTROABS 4.5  --   --   HGB 12.2 13.9 13.1  HCT 37.0 41.0 41.9  MCV 95.4  --  97.9  PLT 195  --  151   Basic Metabolic Panel: Recent Labs  Lab 01/30/20 1637 01/30/20 1722  01/31/20 0438  NA 136 137 137  K 3.4* 3.5 3.6  CL 105 102 106  CO2 24  --  25  GLUCOSE 91 84 74  BUN 13 12 10   CREATININE 0.75 0.60 0.56  CALCIUM 8.4*  --  8.6*   GFR: Estimated Creatinine Clearance: 26.8 mL/min (by C-G formula based on SCr of 0.56 mg/dL). Liver Function Tests: Recent Labs  Lab 01/30/20 1637  AST 23  ALT 14  ALKPHOS 81  BILITOT 0.9  PROT 7.1  ALBUMIN 3.9   Recent Labs  Lab 01/30/20 1637  LIPASE 34   No results for input(s): AMMONIA in the last 168 hours. Coagulation Profile: Recent Labs  Lab 02/02/20 0925  INR 1.1   Cardiac Enzymes: Recent Labs  Lab 01/30/20 1640  CKTOTAL 93   BNP (last 3 results) No results for input(s): PROBNP in the last 8760 hours. HbA1C: No results for input(s): HGBA1C in the last 72 hours. CBG: No results for input(s): GLUCAP in the last 168 hours. Lipid Profile: No results for input(s): CHOL, HDL, LDLCALC, TRIG, CHOLHDL, LDLDIRECT in the last 72 hours. Thyroid Function Tests: Recent Labs    01/30/20 1932 01/31/20 0936  TSH 37.457*  --   FREET4  --  0.57*  T3FREE  --  1.8*   Anemia Panel: No results for input(s): VITAMINB12, FOLATE, FERRITIN, TIBC, IRON, RETICCTPCT in the last 72 hours. Urine analysis:    Component Value Date/Time   COLORURINE STRAW (A) 01/30/2020 1637   APPEARANCEUR CLEAR 01/30/2020 1637   LABSPEC 1.006 01/30/2020 1637   PHURINE 8.0 01/30/2020 1637   GLUCOSEU 50 (A) 01/30/2020 1637   HGBUR NEGATIVE 01/30/2020 1637   BILIRUBINUR NEGATIVE 01/30/2020 1637   KETONESUR NEGATIVE 01/30/2020 1637   PROTEINUR NEGATIVE 01/30/2020 1637   NITRITE NEGATIVE 01/30/2020 1637   LEUKOCYTESUR NEGATIVE 01/30/2020 1637   Recent Results (from the past 240 hour(s))  Blood culture (routine x 2)     Status: None (Preliminary result)   Collection Time: 01/30/20  4:39 PM   Specimen: BLOOD LEFT FOREARM  Result Value Ref Range Status   Specimen Description   Final    BLOOD LEFT FOREARM Performed at [facility], [place] [address], [place], [place] [address]    Special Requests   Final    BOTTLES DRAWN AEROBIC AND ANAEROBIC Blood Culture results may not be optimal due to an inadequate volume of blood received in culture bottles Performed at [place] [address], [place], [place] [address]    Culture   Final    NO GROWTH 3 DAYS Performed at [facility], [place] [address] [place], [place] [address]    Report Status PENDING  Incomplete  Urine culture     Status: Abnormal   Collection Time: 01/30/20  4:45 PM   Specimen: Urine, Random  Result Value Ref Range Status   Specimen Description   Final    URINE, RANDOM Performed at [place] [address] [place], [place] [address]    Special Requests  Final    NONE Performed at [facility], [address], [place], [place] [address]    Culture (A)  Final    <10,000 COLONIES/mL INSIGNIFICANT GROWTH Performed at [facility], [address] [place], [place] [address]    Report Status 02/01/2020 FINAL  Final  Blood culture (routine x 2)     Status: None (Preliminary result)   Collection Time: 01/30/20  5:56 PM   Specimen: BLOOD LEFT HAND  Result Value Ref Range Status   Specimen Description   Final    BLOOD LEFT HAND Performed at [facility], [address] [place], [place] [address]    Special Requests   Final    BOTTLES DRAWN AEROBIC ONLY Blood Culture results may not be optimal due to an inadequate volume of blood received in culture bottles Performed at [facility], [address], [place], [place] [address]    Culture   Final    NO GROWTH 3 DAYS Performed at [facility], [address], [place], [place] [address]    Report Status PENDING  Incomplete  Respiratory Panel by RT PCR (Flu A&B, Covid) - Nasopharyngeal Swab     Status: None   Collection Time: 01/30/20  5:56 PM   Specimen:  Nasopharyngeal Swab  Result Value Ref Range Status   SARS Coronavirus 2 by RT PCR NEGATIVE NEGATIVE Final    Comment: (NOTE) SARS-CoV-2 target nucleic acids are NOT DETECTED. The SARS-CoV-2 RNA is generally detectable in upper respiratoy specimens during the acute phase of infection. The lowest concentration of SARS-CoV-2 viral copies this assay can detect is 131 copies/mL. A negative result does not preclude SARS-Cov-2 infection and should not be used as the sole basis for treatment or other patient management decisions. A negative result may occur with  improper specimen collection/handling, submission of specimen other than nasopharyngeal swab, presence of viral mutation(s) within the areas targeted by this assay, and inadequate number of viral copies (<131 copies/mL). A negative result must be combined with clinical observations, patient history, and epidemiological information. The expected result is Negative. Fact Sheet for Patients:  https://www.moore.com/ Fact Sheet for Healthcare Providers:  https://www.young.biz/ This test is not yet ap proved or cleared by the [place] FDA and  has been authorized for detection and/or diagnosis of SARS-CoV-2 by FDA under an Emergency Use Authorization (EUA). This EUA will remain  in effect (meaning this test can be used) for the duration of the COVID-19 declaration under Section 564(b)(1) of the Act, 21 U.S.C. section 360bbb-3(b)(1), unless the authorization is terminated or revoked sooner.    Influenza A by PCR NEGATIVE NEGATIVE Final   Influenza B by PCR NEGATIVE NEGATIVE Final    Comment: (NOTE) The Xpert Xpress SARS-CoV-2/FLU/RSV assay is intended as an aid in  the diagnosis of influenza from Nasopharyngeal swab specimens and  should not be used as a sole basis for treatment. Nasal washings and  aspirates are unacceptable for Xpert Xpress SARS-CoV-2/FLU/RSV  testing. Fact Sheet for  Patients: https://www.moore.com/ Fact Sheet for Healthcare Providers: https://www.young.biz/ This test is not yet approved or cleared by the [place] FDA and  has been authorized for detection and/or diagnosis of SARS-CoV-2 by  FDA under an Emergency Use Authorization (EUA). This EUA will remain  in effect (meaning this test can be used) for the duration of the  Covid-19 declaration under Section 564(b)(1) of the Act, 21  U.S.C. section 360bbb-3(b)(1), unless the authorization is  terminated or revoked. Performed at [facility], [address], [place], [place] [address]       Radiology Studies: No results found.  Scheduled Meds: . acetaminophen  325 mg Oral Q6H  . enoxaparin (LOVENOX) injection  30 mg Subcutaneous Q24H  . gabapentin  100 mg Oral BID  . levothyroxine  25 mcg Oral QAC breakfast  . polyethylene glycol  17 g Oral Daily   Continuous Infusions:   LOS: 2 days   Time spent: 25 minutes.  Tyrone Nine, MD [facility] [email or web address] 02/02/2020, 2:33 PM

## 2020-02-02 NOTE — Progress Notes (Signed)
Patient not eating or drinking much today. Liquids and medication roll out of patient's mouth. Dr Jarvis Newcomer paged to inquire about an IV pain medication for patient. Lina Sar, RN

## 2020-02-03 ENCOUNTER — Inpatient Hospital Stay (HOSPITAL_COMMUNITY): Payer: Medicare Other

## 2020-02-03 HISTORY — PX: IR KYPHO LUMBAR INC FX REDUCE BONE BX UNI/BIL CANNULATION INC/IMAGING: IMG5519

## 2020-02-03 HISTORY — PX: IR KYPHO EA ADDL LEVEL THORACIC OR LUMBAR: IMG5520

## 2020-02-03 LAB — CBC
HCT: 38.5 % (ref 36.0–46.0)
Hemoglobin: 12.4 g/dL (ref 12.0–15.0)
MCH: 30.8 pg (ref 26.0–34.0)
MCHC: 32.2 g/dL (ref 30.0–36.0)
MCV: 95.8 fL (ref 80.0–100.0)
Platelets: 213 10*3/uL (ref 150–400)
RBC: 4.02 MIL/uL (ref 3.87–5.11)
RDW: 16 % — ABNORMAL HIGH (ref 11.5–15.5)
WBC: 10.1 10*3/uL (ref 4.0–10.5)
nRBC: 0 % (ref 0.0–0.2)

## 2020-02-03 LAB — PROTIME-INR
INR: 1.1 (ref 0.8–1.2)
Prothrombin Time: 14.5 seconds (ref 11.4–15.2)

## 2020-02-03 LAB — SARS CORONAVIRUS 2 (TAT 6-24 HRS): SARS Coronavirus 2: NEGATIVE

## 2020-02-03 MED ORDER — LIDOCAINE HCL (PF) 1 % IJ SOLN
INTRAMUSCULAR | Status: AC
  Filled 2020-02-03: qty 30

## 2020-02-03 MED ORDER — IOHEXOL 300 MG/ML  SOLN
50.0000 mL | Freq: Once | INTRAMUSCULAR | Status: AC | PRN
  Administered 2020-02-03: 50 mL

## 2020-02-03 MED ORDER — FENTANYL CITRATE (PF) 100 MCG/2ML IJ SOLN
INTRAMUSCULAR | Status: AC
  Filled 2020-02-03: qty 2

## 2020-02-03 MED ORDER — MIDAZOLAM HCL 2 MG/2ML IJ SOLN
INTRAMUSCULAR | Status: AC | PRN
  Administered 2020-02-03 (×2): 0.5 mg via INTRAVENOUS

## 2020-02-03 MED ORDER — LIDOCAINE HCL (PF) 1 % IJ SOLN
INTRAMUSCULAR | Status: AC | PRN
  Administered 2020-02-03: 10 mL

## 2020-02-03 MED ORDER — CEFAZOLIN SODIUM-DEXTROSE 2-4 GM/100ML-% IV SOLN: 2.0000 g | INTRAVENOUS | Status: DC

## 2020-02-03 MED ORDER — MIDAZOLAM HCL 2 MG/2ML IJ SOLN
INTRAMUSCULAR | Status: AC
  Filled 2020-02-03: qty 4

## 2020-02-03 MED ORDER — FENTANYL CITRATE (PF) 100 MCG/2ML IJ SOLN
INTRAMUSCULAR | Status: AC | PRN
  Administered 2020-02-03 (×2): 25 ug via INTRAVENOUS

## 2020-02-03 MED ORDER — CEFAZOLIN SODIUM-DEXTROSE 2-4 GM/100ML-% IV SOLN
INTRAVENOUS | Status: AC
  Administered 2020-02-03: 2 g via INTRAVENOUS
  Filled 2020-02-03: qty 100

## 2020-02-03 NOTE — Sedation Documentation (Signed)
SVT 160 for about 20 sec. MD aware VS stable

## 2020-02-03 NOTE — Progress Notes (Signed)
PROGRESS NOTE    Tara Nolan  SEG:[id number] DOB: 1919-06-23 DOA: 01/30/2020 PCP: [name], MD Brief Narrative: 86 y.o. female with a history of CVA and hypothyroidism who presented from home with severe lower back pain without history of trauma.  ED Course:Tm 100.1. WBC nl no other SIRS. CXR neg, COVID neg, flu neg, UA neg. Acute to subacute mild compression fx of L2 and L3, chronic of L1.  Assessment & Plan:   Principal Problem:   Compression fracture of lumbar vertebra (HCC) Active Problems:   History of CVA (cerebrovascular accident)   Hypothyroid   Adverse reaction to vaccine, initial encounter   #1  Multiple compression fractures patient admitted with severe low back pain.  Status post kyphoplasty 02/03/2020. Continue standing dose of Tylenol and as needed baclofen. Continue gabapentin and heating pad. Plan to discharge her to SNF tomorrow.  Covid negative 3/9  #2 dysphagia severe aspiration risk no artificial feeding, okay for comfort feeding   #3 goals of care DNR, no artificial feeding.  #4 hypothyroidism severe significant-TSH is 37.45, T4 is 0.57, T3 is 1.8.  Continue Synthroid.    Estimated body mass index is 16.87 kg/m as calculated from the following:   Height as of this encounter: 5\' 3"  (1.6 m).   Weight as of this encounter: 43.2 kg.  DVT prophylaxis: Lovenox 30mg  Code Status: DNR Family Communication:  None Disposition Plan:  Patient will be discharged to SNF tomorrow 02/04/2020 with palliative care following.  Consultants:   Palliative care  IR  Procedures:   None  Antimicrobials:  None    Subjective:  Resting in bed awake alert anxious to have the surgery done Objective: Vitals:   02/03/20 1121 02/03/20 1214 02/03/20 1328 02/03/20 1421  BP: (!) 152/99 (!) 172/96 (!) 184/98 (!) 183/93  Pulse: (!) 112 92 96 94  Resp: 15 14 16 16   Temp: 98.5 F (36.9 C) 98.6 F (37 C) 98.9 F (37.2 C) 98.4 F (36.9 C)    TempSrc: Oral Oral Oral Oral  SpO2: 95% 98% 100% 92%  Weight:      Height:        Intake/Output Summary (Last 24 hours) at 02/03/2020 1426 Last data filed at 02/03/2020 0939 Gross per 24 hour  Intake 0 ml  Output 1400 ml  Net -1400 ml   Filed Weights   01/31/20 1604  Weight: 43.2 kg    Examination:  General exam: Appears calm and comfortable  Respiratory system: Clear to auscultation. Respiratory effort normal. Cardiovascular system: S1 & S2 heard, RRR. No JVD, murmurs, rubs, gallops or clicks. No pedal edema. Gastrointestinal system: Abdomen is nondistended, soft and nontender. No organomegaly or masses felt. Normal bowel sounds heard. Central nervous system: Alert and oriented. No focal neurological deficits. Extremities: Symmetric 5 x 5 power. Skin: No rashes, lesions or ulcers Psychiatry: Judgement and insight appear normal. Mood & affect appropriate.     Data Reviewed: I have personally reviewed following labs and imaging studies  CBC: Recent Labs  Lab 01/30/20 1637 01/30/20 1722 01/31/20 0438  WBC 6.8  --  5.1  NEUTROABS 4.5  --   --   HGB 12.2 13.9 13.1  HCT 37.0 41.0 41.9  MCV 95.4  --  97.9  PLT 195  --  194   Basic Metabolic Panel: Recent Labs  Lab 01/30/20 1637 01/30/20 1722 01/31/20 0438  NA 136 137 137  K 3.4* 3.5 3.6  CL 105 102 106  CO2 24  --  25  GLUCOSE 91 84 74  BUN 13 12 10   CREATININE 0.75 0.60 0.56  CALCIUM 8.4*  --  8.6*   GFR: Estimated Creatinine Clearance: 26.8 mL/min (by C-G formula based on SCr of 0.56 mg/dL). Liver Function Tests: Recent Labs  Lab 01/30/20 1637  AST 23  ALT 14  ALKPHOS 81  BILITOT 0.9  PROT 7.1  ALBUMIN 3.9   Recent Labs  Lab 01/30/20 1637  LIPASE 34   No results for input(s): AMMONIA in the last 168 hours. Coagulation Profile: Recent Labs  Lab 02/02/20 0925  INR 1.1   Cardiac Enzymes: Recent Labs  Lab 01/30/20 1640  CKTOTAL 93   BNP (last 3 results) No results for input(s):  PROBNP in the last 8760 hours. HbA1C: No results for input(s): HGBA1C in the last 72 hours. CBG: No results for input(s): GLUCAP in the last 168 hours. Lipid Profile: No results for input(s): CHOL, HDL, LDLCALC, TRIG, CHOLHDL, LDLDIRECT in the last 72 hours. Thyroid Function Tests: No results for input(s): TSH, T4TOTAL, FREET4, T3FREE, THYROIDAB in the last 72 hours. Anemia Panel: No results for input(s): VITAMINB12, FOLATE, FERRITIN, TIBC, IRON, RETICCTPCT in the last 72 hours. Sepsis Labs: Recent Labs  Lab 01/30/20 1637 01/30/20 1756  LATICACIDVEN 1.0 1.2    Recent Results (from the past 240 hour(s))  Blood culture (routine x 2)     Status: None (Preliminary result)   Collection Time: 01/30/20  4:39 PM   Specimen: BLOOD LEFT FOREARM  Result Value Ref Range Status   Specimen Description   Final    BLOOD LEFT FOREARM Performed at [facility] [address] [place], [place] [address]    Special Requests   Final    BOTTLES DRAWN AEROBIC AND ANAEROBIC Blood Culture results may not be optimal due to an inadequate volume of blood received in culture bottles Performed at [facility] [address], [place], [place] [address]    Culture   Final    NO GROWTH 4 DAYS Performed at [facility], [place] [address], [place], [place] [address]    Report Status PENDING  Incomplete  Urine culture     Status: Abnormal   Collection Time: 01/30/20  4:45 PM   Specimen: Urine, Random  Result Value Ref Range Status   Specimen Description   Final    URINE, RANDOM Performed at [facility] [address], [place], [place] [address]    Special Requests   Final    NONE Performed at [facility], [place] [address], [place], [place] [address]    Culture (A)  Final    <10,000 COLONIES/mL INSIGNIFICANT GROWTH Performed at [facility] [address], [place], [place] [address]    Report Status 02/01/2020  FINAL  Final  Blood culture (routine x 2)     Status: None (Preliminary result)   Collection Time: 01/30/20  5:56 PM   Specimen: BLOOD LEFT HAND  Result Value Ref Range Status   Specimen Description   Final    BLOOD LEFT HAND Performed at [facility] [address], [place], [place] [address]    Special Requests   Final    BOTTLES DRAWN AEROBIC ONLY Blood Culture results may not be optimal due to an inadequate volume of blood received in culture bottles Performed at [facility] [address], [place], [place] [address]    Culture   Final  NO GROWTH 4 DAYS Performed at [facility], [address] [place], [place] [address]    Report Status PENDING  Incomplete  Respiratory Panel by RT PCR (Flu A&B, Covid) - Nasopharyngeal Swab     Status: None   Collection Time: 01/30/20  5:56 PM   Specimen: Nasopharyngeal Swab  Result Value Ref Range Status   SARS Coronavirus 2 by RT PCR NEGATIVE NEGATIVE Final    Comment: (NOTE) SARS-CoV-2 target nucleic acids are NOT DETECTED. The SARS-CoV-2 RNA is generally detectable in upper respiratoy specimens during the acute phase of infection. The lowest concentration of SARS-CoV-2 viral copies this assay can detect is 131 copies/mL. A negative result does not preclude SARS-Cov-2 infection and should not be used as the sole basis for treatment or other patient management decisions. A negative result may occur with  improper specimen collection/handling, submission of specimen other than nasopharyngeal swab, presence of viral mutation(s) within the areas targeted by this assay, and inadequate number of viral copies (<131 copies/mL). A negative result must be combined with clinical observations, patient history, and epidemiological information. The expected result is Negative. Fact Sheet for Patients:  https://www.moore.com/ Fact Sheet for Healthcare Providers:    https://www.young.biz/ This test is not yet ap proved or cleared by the [place] FDA and  has been authorized for detection and/or diagnosis of SARS-CoV-2 by FDA under an Emergency Use Authorization (EUA). This EUA will remain  in effect (meaning this test can be used) for the duration of the COVID-19 declaration under Section 564(b)(1) of the Act, 21 U.S.C. section 360bbb-3(b)(1), unless the authorization is terminated or revoked sooner.    Influenza A by PCR NEGATIVE NEGATIVE Final   Influenza B by PCR NEGATIVE NEGATIVE Final    Comment: (NOTE) The Xpert Xpress SARS-CoV-2/FLU/RSV assay is intended as an aid in  the diagnosis of influenza from Nasopharyngeal swab specimens and  should not be used as a sole basis for treatment. Nasal washings and  aspirates are unacceptable for Xpert Xpress SARS-CoV-2/FLU/RSV  testing. Fact Sheet for Patients: https://www.moore.com/ Fact Sheet for Healthcare Providers: https://www.young.biz/ This test is not yet approved or cleared by the [place] FDA and  has been authorized for detection and/or diagnosis of SARS-CoV-2 by  FDA under an Emergency Use Authorization (EUA). This EUA will remain  in effect (meaning this test can be used) for the duration of the  Covid-19 declaration under Section 564(b)(1) of the Act, 21  U.S.C. section 360bbb-3(b)(1), unless the authorization is  terminated or revoked. Performed at [facility], [address] [place], [place] [address]   SARS CORONAVIRUS 2 (TAT 6-24 HRS) Nasopharyngeal Nasopharyngeal Swab     Status: None   Collection Time: 02/02/20  2:33 PM   Specimen: Nasopharyngeal Swab  Result Value Ref Range Status   SARS Coronavirus 2 NEGATIVE NEGATIVE Final    Comment: (NOTE) SARS-CoV-2 target nucleic acids are NOT DETECTED. The SARS-CoV-2 RNA is generally detectable in upper and lower respiratory specimens during the  acute phase of infection. Negative results do not preclude SARS-CoV-2 infection, do not rule out co-infections with other pathogens, and should not be used as the sole basis for treatment or other patient management decisions. Negative results must be combined with clinical observations, patient history, and epidemiological information. The expected result is Negative. Fact Sheet for Patients: HairSlick.no Fact Sheet for Healthcare Providers: quierodirigir.com This test is not yet approved or cleared by the [place] FDA and  has been authorized for  detection and/or diagnosis of SARS-CoV-2 by FDA under an Emergency Use Authorization (EUA). This EUA will remain  in effect (meaning this test can be used) for the duration of the COVID-19 declaration under Section 56 4(b)(1) of the Act, 21 U.S.C. section 360bbb-3(b)(1), unless the authorization is terminated or revoked sooner. Performed at [facility], [address], [place], [place] [address]          Radiology Studies: IR KYPHO LUMBAR INC FX REDUCE BONE BX UNI/BIL CANNULATION INC/IMAGING  Result Date: 02/03/2020 CLINICAL DATA:  Symptomatic L2 and L3 compression fractures. EXAM: FLUOROSCOPIC GUIDED KYPHOPLASTY OF THE L2 AND L3 VERTEBRAL BODIES COMPARISON:  CT abdomen and pelvis - 01/30/2020 MEDICATIONS: As antibiotic prophylaxis, Ancef 2 gm IV was ordered pre-procedure and administered intravenously within 1 hour of incision. ANESTHESIA/SEDATION: Moderate (conscious) sedation was employed during this procedure. A total of Versed 1 mg and Fentanyl 50 mcg was administered intravenously. Moderate Sedation Time: 30 minutes. The patient's level of consciousness and vital signs were monitored continuously by radiology nursing throughout the procedure under my direct supervision. FLUOROSCOPY TIME:  8 minutes, 6 seconds (291 mGy. COMPLICATIONS: None immediate. TECHNIQUE: The  procedure, risks (including but not limited to bleeding, infection, organ damage), benefits, and alternatives were explained to the patient. Questions regarding the procedure were encouraged and answered. The patient understands and consents to the procedure. The patient was placed prone on the fluoroscopic table. The skin overlying the upper thoracic region was then prepped and draped in the usual sterile fashion. Maximal barrier sterile technique was utilized including caps, mask, sterile gowns, sterile gloves, sterile drape, hand hygiene and skin antiseptic. Intravenous Fentanyl and Versed were administered as conscious sedation during continuous cardiorespiratory monitoring by the radiology RN. The left pedicle at L2 was then infiltrated with 1% lidocaine followed by the advancement of a Kyphon trocar needle through the left pedicle into the posterior one-third of the vertebral body. Subsequently, the osteo drill was advanced to the anterior third of the vertebral body. The osteo drill was retracted. Through the working cannula, a Kyphon inflatable bone tamp 15 x 3 was advanced and positioned with the distal marker approximately 5 mm from the anterior aspect of the cortex. Appropriate positioning was confirmed on the AP projection. At this time, the balloon was expanded using contrast via a Kyphon inflation syringe device via micro tubing. In similar fashion, the left L3 pedicle was infiltrated with 1% lidocaine followed by the advancement of a second Kyphon trocar needle through the right pedicle into the posterior third of the vertebral body. Subsequently, the osteo drill was coaxially advanced to the anterior right third. The osteo drill was exchanged for a Kyphon inflatable bone tamp 15 x 3, advanced to the 5 mm of the anterior aspect of the cortex. The balloon was then expanded using contrast as above. Inflations were performed under direct fluoroscopic guidance. At this time, methylmethacrylate mixture was  reconstituted in the Kyphon bone mixing device system. This was then loaded into the delivery mechanism, attached to Kyphon bone fillers. The balloons were deflated and removed followed by the instillation of methylmethacrylate mixture with excellent filling in the AP and lateral projections. No extravasation was noted in the disk spaces or posteriorly into the spinal canal. A trace amount of epidural venous contamination with seen bilaterally at L2 and on the right side of the L3 vertebral body. The working cannulae and the bone fillers were then retrieved and removed. Hemostasis was achieved with manual compression. The patient  tolerated the procedure well without immediate postprocedural complication. IMPRESSION: 1. Technically successful L2 and L3 vertebral body augmentation using balloon kyphoplasty. 2. Per CMS PQRS reporting requirements (PQRS Measure 24): Given the patient's age of greater than 50 and the fracture site (hip, distal radius, or spine), the patient should be tested for osteoporosis using DXA, and the appropriate treatment considered based on the DXA results. Electronically Signed   By: Simonne Come M.D.   On: 02/03/2020 12:33   IR KYPHO EA ADDL LEVEL THORACIC OR LUMBAR  Result Date: 02/03/2020 CLINICAL DATA:  Symptomatic L2 and L3 compression fractures. EXAM: FLUOROSCOPIC GUIDED KYPHOPLASTY OF THE L2 AND L3 VERTEBRAL BODIES COMPARISON:  CT abdomen and pelvis - 01/30/2020 MEDICATIONS: As antibiotic prophylaxis, Ancef 2 gm IV was ordered pre-procedure and administered intravenously within 1 hour of incision. ANESTHESIA/SEDATION: Moderate (conscious) sedation was employed during this procedure. A total of Versed 1 mg and Fentanyl 50 mcg was administered intravenously. Moderate Sedation Time: 30 minutes. The patient's level of consciousness and vital signs were monitored continuously by radiology nursing throughout the procedure under my direct supervision. FLUOROSCOPY TIME:  8 minutes, 6 seconds  (291 mGy. COMPLICATIONS: None immediate. TECHNIQUE: The procedure, risks (including but not limited to bleeding, infection, organ damage), benefits, and alternatives were explained to the patient. Questions regarding the procedure were encouraged and answered. The patient understands and consents to the procedure. The patient was placed prone on the fluoroscopic table. The skin overlying the upper thoracic region was then prepped and draped in the usual sterile fashion. Maximal barrier sterile technique was utilized including caps, mask, sterile gowns, sterile gloves, sterile drape, hand hygiene and skin antiseptic. Intravenous Fentanyl and Versed were administered as conscious sedation during continuous cardiorespiratory monitoring by the radiology RN. The left pedicle at L2 was then infiltrated with 1% lidocaine followed by the advancement of a Kyphon trocar needle through the left pedicle into the posterior one-third of the vertebral body. Subsequently, the osteo drill was advanced to the anterior third of the vertebral body. The osteo drill was retracted. Through the working cannula, a Kyphon inflatable bone tamp 15 x 3 was advanced and positioned with the distal marker approximately 5 mm from the anterior aspect of the cortex. Appropriate positioning was confirmed on the AP projection. At this time, the balloon was expanded using contrast via a Kyphon inflation syringe device via micro tubing. In similar fashion, the left L3 pedicle was infiltrated with 1% lidocaine followed by the advancement of a second Kyphon trocar needle through the right pedicle into the posterior third of the vertebral body. Subsequently, the osteo drill was coaxially advanced to the anterior right third. The osteo drill was exchanged for a Kyphon inflatable bone tamp 15 x 3, advanced to the 5 mm of the anterior aspect of the cortex. The balloon was then expanded using contrast as above. Inflations were performed under direct  fluoroscopic guidance. At this time, methylmethacrylate mixture was reconstituted in the Kyphon bone mixing device system. This was then loaded into the delivery mechanism, attached to Kyphon bone fillers. The balloons were deflated and removed followed by the instillation of methylmethacrylate mixture with excellent filling in the AP and lateral projections. No extravasation was noted in the disk spaces or posteriorly into the spinal canal. A trace amount of epidural venous contamination with seen bilaterally at L2 and on the right side of the L3 vertebral body. The working cannulae and the bone fillers were then retrieved and removed. Hemostasis was achieved with manual  compression. The patient tolerated the procedure well without immediate postprocedural complication. IMPRESSION: 1. Technically successful L2 and L3 vertebral body augmentation using balloon kyphoplasty. 2. Per CMS PQRS reporting requirements (PQRS Measure 24): Given the patient's age of greater than 50 and the fracture site (hip, distal radius, or spine), the patient should be tested for osteoporosis using DXA, and the appropriate treatment considered based on the DXA results. Electronically Signed   By: Simonne ComeJohn  Watts M.D.   On: 02/03/2020 12:33        Scheduled Meds: . acetaminophen  325 mg Oral Q6H  . [START ON 02/04/2020] enoxaparin (LOVENOX) injection  30 mg Subcutaneous Q24H  . fentaNYL      . gabapentin  100 mg Oral BID  . levothyroxine  12.5 mcg Intravenous Daily  . lidocaine (PF)      . midazolam      . polyethylene glycol  17 g Oral Daily   Continuous Infusions:   LOS: 3 days     Alwyn RenElizabeth G Ayinde Swim, MD  02/03/2020, 2:26 PM

## 2020-02-03 NOTE — Progress Notes (Signed)
PT Cancellation Note  Patient Details Name: Tara Nolan MRN: [id number] DOB: October 25, 1919   Cancelled Treatment:    Reason Eval/Treat Not Completed: Medical issues which prohibited therapy- Patient had a procedure today and was to be flat on bed rest part of this afternoon, after which they were to do another bladder scan. Will reschedule and monitor closely. Harriett Rush, PT # [phone] CGV cell  Ephriam Jenkins 02/03/2020, 2:22 PM

## 2020-02-03 NOTE — Procedures (Signed)
Pre procedural Dx: Symptomatic compression fractures Post procedural Dx: Same  Technically successful cement augmentation of the L2 and L3 vertebral bodies.  Patient is to lie flat for 3 hours.  EBL: Minimal Complications: None immediate.  Katherina Right, MD Pager #: [phone]

## 2020-02-03 NOTE — Progress Notes (Signed)
Pt voided re-bladder scanned . Foley placed.

## 2020-02-03 NOTE — Progress Notes (Signed)
MEDICATION-RELATED CONSULT NOTE   IR Procedure Consult - Anticoagulant/Antiplatelet PTA/Inpatient Med List Review by Pharmacist    Procedure: kyphoplasty    Completed: 02/03/20 1100  Post-Procedural bleeding risk per IR MD assessment:  standard  Antithrombotic medications on inpatient or PTA profile prior to procedure:   enoxaparin    Recommended restart time per IR Post-Procedure Guidelines:  Day +1 AM   Other considerations:    none  Plan:     Lovenox 30 mg daily ordered starting 3/11 AM.   Luisa Hart, PharmD, BCPS

## 2020-02-03 NOTE — Progress Notes (Signed)
Pt pulse 113 making her a MEWS 2. Pt stable MD notified. MEWS protocol started.

## 2020-02-03 NOTE — Progress Notes (Signed)
Patient has not had any output during shift, pt currently has purewick in with 0 volume in canister. Patient is currently NPO beginning at midnight, KVO IV fluids running. Bladder scan showed patient retaining 534 ml 1st scan and 603 2nd scan. MD notified of findings and order placed for in and out cath. Removed of urine from bladder. Marcelle Overlie, RN

## 2020-02-03 NOTE — Progress Notes (Signed)
Pt has not voided during shift. Bladder scan showed MD notified new orders placed.

## 2020-02-04 LAB — CULTURE, BLOOD (ROUTINE X 2)
Culture: NO GROWTH
Culture: NO GROWTH

## 2020-02-04 MED ORDER — ONDANSETRON HCL 4 MG PO TABS: 4.0000 mg | ORAL_TABLET | Freq: Four times a day (QID) | ORAL | 0 refills | Status: DC | PRN

## 2020-02-04 MED ORDER — POLYETHYLENE GLYCOL 3350 17 G PO PACK: 17.0000 g | PACK | Freq: Every day | ORAL | 0 refills | Status: AC

## 2020-02-04 MED ORDER — LEVOTHYROXINE SODIUM 25 MCG PO TABS: 25.0000 ug | ORAL_TABLET | Freq: Every day | ORAL | Status: DC

## 2020-02-04 MED ORDER — ACETAMINOPHEN 325 MG PO TABS: 325.0000 mg | ORAL_TABLET | Freq: Four times a day (QID) | ORAL | Status: AC

## 2020-02-04 MED ORDER — BACLOFEN 5 MG PO TABS: 5.0000 mg | ORAL_TABLET | Freq: Two times a day (BID) | ORAL | 0 refills | Status: DC | PRN

## 2020-02-04 MED ORDER — LEVOTHYROXINE SODIUM 25 MCG PO TABS: 25.0000 ug | ORAL_TABLET | Freq: Every day | ORAL | 2 refills | Status: DC

## 2020-02-04 MED ORDER — SENNOSIDES-DOCUSATE SODIUM 8.6-50 MG PO TABS: 1.0000 | ORAL_TABLET | Freq: Every evening | ORAL | Status: AC | PRN

## 2020-02-04 NOTE — TOC Initial Note (Addendum)
Transition of Care [facility]) - Initial/Assessment Note    Patient Details  Name: Tara Nolan MRN: [id number] Date of Birth: [birth year]  Transition of Care [facility]) CM/SW Contact:    Leeroy Cha, RN Phone Number: 02/04/2020, 11:07 AM  Clinical Narrative:                 Pt being transferred to [facility] place ptar called at [phone] for transport.   Floor RN notified and packet given to RN. Chart checked for prescriptions.and papers Most form sent with patient.  Expected Discharge Plan: Skilled Nursing Facility Barriers to Discharge: No Barriers Identified   Patient Goals and CMS Choice Patient states their goals for this hospitalization and ongoing recovery are:: TO EVENTUALLY [place] CMS Medicare.gov Compare Post Acute Care list provided to:: Patient Choice offered to / list presented to : Adult Children  Expected Discharge Plan and Services Expected Discharge Plan: [place]   Discharge Planning Services: CM Consult Post Acute Care Choice: [place] Living arrangements for the past 2 months: Single Family Home Expected Discharge Date: 02/04/20                                    Prior Living Arrangements/Services Living arrangements for the past 2 months: Single Family Home Lives with:: Adult Children Patient language and need for interpreter reviewed:: No Do you feel safe going back to the place where you live?: Yes      Need for Family Participation in Patient Care: Yes (Comment) Care giver support system in place?: Yes (comment)   Criminal Activity/Legal Involvement Pertinent to Current Situation/Hospitalization: No - Comment as needed  Activities of Daily Living Home Assistive Devices/Equipment: Eyeglasses, Hearing aid ADL Screening (condition at time of admission) Patient's cognitive ability adequate to safely complete daily activities?: Yes Is the patient deaf or have difficulty hearing?: Yes Does the patient have  difficulty seeing, even when wearing glasses/contacts?: No Does the patient have difficulty concentrating, remembering, or making decisions?: No Patient able to express need for assistance with ADLs?: Yes Does the patient have difficulty dressing or bathing?: Yes Independently performs ADLs?: No Communication: Independent Dressing (OT): Needs assistance Is this a change from baseline?: Pre-admission baseline Grooming: Needs assistance Is this a change from baseline?: Pre-admission baseline Feeding: Needs assistance Is this a change from baseline?: Pre-admission baseline Bathing: Needs assistance Is this a change from baseline?: Pre-admission baseline Toileting: Needs assistance, Dependent Is this a change from baseline?: Pre-admission baseline In/Out Bed: Dependent Is this a change from baseline?: Pre-admission baseline Walks in Home: Independent with device (comment) Does the patient have difficulty walking or climbing stairs?: Yes Weakness of Legs: None Weakness of Arms/Hands: None  Permission Sought/Granted                  Emotional Assessment Appearance:: Appears stated age Attitude/Demeanor/Rapport: Engaged Affect (typically observed): Pleasant Orientation: : Oriented to Self, Oriented to Place, Oriented to  Time, Oriented to Situation Alcohol / Substance Use: Not Applicable Psych Involvement: No (comment)  Admission diagnosis:  Back pain [M54.9] Compression fracture of lumbar vertebra ([place]) [S32.000A] Compression fracture of L2 vertebra, initial encounter ([place]) [S32.020A] Patient Active Problem List   Diagnosis Date Noted  . Adverse reaction to vaccine, initial encounter 01/30/2020  . Compression fracture of lumbar vertebra ([place]) 01/30/2020  . Syncope and collapse 11/22/2018  . Back pain 11/22/2018  . Hypothyroid 11/22/2018  . History  of CVA (cerebrovascular accident) 09/12/2017  . Fall 09/08/2017  . Right shoulder pain 09/08/2017  . Left wrist fracture  09/08/2017  . History of thyroid disease 09/08/2017  . Chest discomfort 09/08/2017  . Hip pain, acute 06/20/2012  . Knee pain 06/20/2012  . Hamstring tendon rupture 06/20/2012   PCP:  [name], MD Pharmacy:   [facility] [address] [place] - [address] [place] [address] Phone: [phone] Fax: [phone]     Social Determinants of Health (SDOH) Interventions    Readmission Risk Interventions No flowsheet data found.

## 2020-02-04 NOTE — Progress Notes (Signed)
Occupational Therapy Treatment Patient Details Name: Tara Nolan MRN: [id number] DOB: Apr 07, 1919 Today's Date: 02/04/2020    History of present illness Tara Nolan is a 86 y.o. female with medical history significant of CVA, hypothyroidism. admitted with weakness, found to have  acute to subacute mild compression fx of L2 and L3, chronic of L1.   OT comments  Plans are for SNF.  Pt MUCH improved this day.  Decreased pain, more awake and participative.  Follow Up Recommendations  SNF    Equipment Recommendations  None recommended by OT    Recommendations for Other Services      Precautions / Restrictions Precautions Precautions: Fall       Mobility Bed Mobility Overal bed mobility: Needs Assistance Bed Mobility: Rolling;Sidelying to Sit   Sidelying to sit: Mod assist       General bed mobility comments: assist with trunk and LEs   Transfers Overall transfer level: Needs assistance Equipment used: 1 person hand held assist Transfers: Sit to/from Stand;Stand Pivot Transfers Sit to Stand: Mod assist Stand pivot transfers: Mod assist       General transfer comment: assist to rise and  balance while wt shifting to pivot to chair    Balance Overall balance assessment: Needs assistance;History of Falls Sitting-balance support: No upper extremity supported;Feet supported Sitting balance-Leahy Scale: Fair       Standing balance-Leahy Scale: Poor Standing balance comment: reliant on UE support                           ADL either performed or assessed with clinical judgement   ADL       Grooming: Wash/dry face;Sitting;Minimal assistance;Wash/dry Teacher, music: Stand-pivot;Squat-pivot;Moderate assistance Toilet Transfer Details (indicate cue type and reason): bed to chair Toileting- Clothing Manipulation and Hygiene: Maximal assistance;Cueing for sequencing;Cueing for safety         General ADL  Comments: pt did get to chair from bed.  Pt much more awake this day! Daughter present     Vision Patient Visual Report: No change from baseline            Cognition Arousal/Alertness: Awake/alert Behavior During Therapy: WFL for tasks assessed/performed Overall Cognitive Status: Impaired/Different from baseline                                                     Pertinent Vitals/ Pain       Pain Assessment: 0-10 Pain Score: 4  Pain Location: back Pain Descriptors / Indicators: Grimacing;Dull;Discomfort Pain Intervention(s): Repositioned         Frequency  Min 2X/week        Progress Toward Goals  OT Goals(current goals can now be found in the care plan section)  Progress towards OT goals: Progressing toward goals      AM-PAC OT "6 Clicks" Daily Activity     Outcome Measure   Help from another person eating meals?: A Lot Help from another person taking care of personal grooming?: A Lot Help from another person toileting, which includes using toliet, bedpan, or urinal?: A Lot Help from another person bathing (including washing, rinsing, drying)?: A Lot Help from another person to put on and taking  off regular upper body clothing?: A Lot Help from another person to put on and taking off regular lower body clothing?: A Lot 6 Click Score: 12    End of Session    OT Visit Diagnosis: Unsteadiness on feet (R26.81);Other abnormalities of gait and mobility (R26.89);Muscle weakness (generalized) (M62.81);Repeated falls (R29.6)   Activity Tolerance Patient tolerated treatment well   Patient Left in chair;with call bell/phone within reach;with family/visitor present   Nurse Communication Mobility status        Time: 1040-1100 OT Time Calculation (min): 20 min  Charges: OT General Charges $OT Visit: 1 Visit OT Treatments $Self Care/Home Management : 8-22 mins  Lise Auer, OT Acute Rehabilitation Services [phone] Office-  [phone]      Kemara Quigley, Karin Golden D 02/04/2020, 3:13 PM

## 2020-02-04 NOTE — Discharge Summary (Addendum)
Physician Discharge Summary  Angeligue Bowne Leng BPZ:[id number] DOB: May 06, 1919 DOA: 01/30/2020  PCP: Chesley Noon, MD  Admit date: 01/30/2020 Discharge date: 02/04/2020  Admitted From: Home Disposition: Skilled nursing facility Recommendations for Outpatient Follow-up:  1. Follow up with PCP in 1-2 weeks 2. Please obtain BMP/CBC in one week 3. Palliative care to follow at the facility   Home Health: None Equipment/Devices: None Discharge Condition: Stable and improved  CODE STATUS: DO NOT RESUSCITATE diet recommendation: Regular diet Brief/Interim Summary:86 y.o. female with a history of CVA and hypothyroidism who presented from home with severe lower back pain without history of trauma.  ED Course:Tm 100.1. WBC nl no other SIRS. CXR neg, COVID neg, flu neg, UA neg. Acute to subacute mild compression fx of L2 and L3, chronic of L1.  Discharge Diagnoses:  Principal Problem:   Compression fracture of lumbar vertebra (HCC) Active Problems:   History of CVA (cerebrovascular accident)   Hypothyroid   Adverse reaction to vaccine, initial encounter     #1  Multiple compression fractures patient admitted with severe low back pain.  Status post kyphoplasty 02/03/2020. Continue standing dose of Tylenol and as needed baclofen. Continue gabapentin and heating pad. Plan to discharge her to SNF today Covid negative 3/9  #2 dysphagia severe aspiration risk no artificial feeding, okay for comfort feeding   #3 goals of care DNR, no artificial feeding.  #4 hypothyroidism severe significant-TSH is 37.45, T4 is 0.57, T3 is 1.8.  Continue Synthroid.  Patient does not like to take Synthroid as it makes her feel bad she cannot describe what effect Synthroid has on her.  However we will continue Synthroid at 25 MCG daily.  #5 Covid negative patient received her first dose of vaccine already she must get her second dose as scheduled at the [facility] or at the facility.  #6 urinary  retention-foley placed.foley care on dc.follow up at [facility] urology   Estimated body mass index is 16.87 kg/m as calculated from the following:   Height as of this encounter: 5\' 3"  (1.6 m).   Weight as of this encounter: 43.2 kg.  Discharge Instructions  Discharge Instructions    Call MD for:  persistant nausea and vomiting   Complete by: As directed    Call MD for:  temperature >100.4   Complete by: As directed    Diet - low sodium heart healthy   Complete by: As directed    Increase activity slowly   Complete by: As directed      Allergies as of 02/04/2020      Reactions   Other Other (See Comments)   All medications make me bonkers       Medication List    TAKE these medications   acetaminophen 325 MG tablet Commonly known as: TYLENOL Take 1 tablet (325 mg total) by mouth every 6 (six) hours.   Baclofen 5 MG Tabs Take 5 mg by mouth 2 (two) times daily as needed for muscle spasms.   gabapentin 100 MG capsule Commonly known as: NEURONTIN Take 100 mg by mouth 3 (three) times daily.   levothyroxine 25 MCG tablet Commonly known as: SYNTHROID Take 1 tablet (25 mcg total) by mouth daily at 6 (six) AM.   ondansetron 4 MG tablet Commonly known as: ZOFRAN Take 1 tablet (4 mg total) by mouth every 6 (six) hours as needed for nausea.   polyethylene glycol 17 g packet Commonly known as: MIRALAX / GLYCOLAX Take 17 g by mouth daily.  senna-docusate 8.6-50 MG tablet Commonly known as: Senokot-S Take 1 tablet by mouth at bedtime as needed for mild constipation or moderate constipation.      Follow-up Information    [name], MD Follow up.   Specialty: Family Medicine Contact information: [address] [place] [address] [phone]          Allergies  Allergen Reactions  . Other Other (See Comments)    All medications make me bonkers     Consultations: Interventional radiology  Procedures/Studies: CT Head Wo Contrast  Result  Date: 01/30/2020 CLINICAL DATA:  Transient ischemic attack. EXAM: CT HEAD WITHOUT CONTRAST TECHNIQUE: Contiguous axial images were obtained from the base of the skull through the vertex without intravenous contrast. COMPARISON:  12/26/2019 FINDINGS: Brain: Stable minimally enlarged ventricles and moderately enlarged subarachnoid spaces involving the cerebral hemispheres and posterior fossa. Stable mild patchy white matter low density in both cerebral hemispheres. No intracranial hemorrhage, mass lesion or CT evidence of acute infarction. Vascular: No hyperdense vessel or unexpected calcification. Skull: Normal. Negative for fracture or focal lesion. Sinuses/Orbits: Stable single opacified right ethmoid air cell. Unremarkable orbits. Other: Bilateral temporomandibular joint degenerative changes. IMPRESSION: 1. No acute abnormality. 2. Stable atrophy and chronic small vessel white matter ischemic changes. 3. Bilateral TMJ degenerative changes. Electronically Signed   By: Beckie Salts M.D.   On: 01/30/2020 18:39   CT ABDOMEN PELVIS W CONTRAST  Result Date: 01/30/2020 CLINICAL DATA:  Diffuse abdominal pain, worse in the back. Difficulty walking. Larey Seat 2 years ago with chronic back pain since then. The difficulty walking started today. The lower back pain is severe. EXAM: CT ABDOMEN AND PELVIS WITH CONTRAST TECHNIQUE: Multidetector CT imaging of the abdomen and pelvis was performed using the standard protocol following bolus administration of intravenous contrast. CONTRAST:  OMNIPAQUE IOHEXOL 300 MG/ML  SOLN COMPARISON:  12/08/2019 FINDINGS: Lower chest: Stable mild linear scarring at both lung bases. Stable enlarged heart. Hepatobiliary: No focal liver abnormality is seen other than a tiny right lobe cyst. No gallstones, gallbladder wall thickening, or biliary dilatation. Pancreas: Mild diffuse pancreatic ductal dilatation with no obstructing stone or mass seen. Otherwise, unremarkable pancreas. Spleen: Normal in  size without focal abnormality. Adrenals/Urinary Tract: Bilateral renal cysts. Normal appearing adrenal glands. Distended urinary bladder. Unremarkable visualized portions of the ureters. No calculi or hydronephrosis seen. Stomach/Bowel: Multiple sigmoid colon diverticula without evidence of diverticulitis. No evidence of appendicitis. Unremarkable stomach and small bowel. Vascular/Lymphatic: Atheromatous arterial calcifications without aneurysm. No enlarged lymph nodes. Reproductive: Status post hysterectomy. No adnexal masses. Other: No abdominal wall hernia or abnormality. No abdominopelvic ascites. Musculoskeletal: Diffuse osteopenia. Stable thoracolumbar scoliosis and degenerative changes. These include facet degenerative changes with stable grade 1 anterolisthesis at the L4-5 level and grade 2 anterolisthesis at the L5-S1 level. Stable L1 and T10 vertebral compression deformities. No acute fractures or subluxations. IMPRESSION: 1. No acute abnormality. 2. Sigmoid diverticulosis. Electronically Signed   By: Beckie Salts M.D.   On: 01/30/2020 18:30   CT L-SPINE NO CHARGE  Result Date: 01/30/2020 CLINICAL DATA:  Diffuse abdominal pain, pain worse in back, difficulty walking, patient reports falling 2 years ago, back pain since, difficulty walking starting today. Severe low back pain. EXAM: CT LUMBAR SPINE WITHOUT CONTRAST TECHNIQUE: Multidetector CT imaging of the lumbar spine was performed without intravenous contrast administration. Multiplanar CT image reconstructions were also generated. COMPARISON:  Radiographs of the lumbar spine 12/26/2019, lumbar spine CT 11/22/2018, CT abdomen/pelvis 12/08/2019. FINDINGS: Segmentation: 5 lumbar vertebrae.  Alignment: Prominent thoracic dextrocurvature. Trace L1 bony retropulsion, unchanged. Redemonstrated L4-L5 and L5-S1 grade 1 anterolisthesis. Vertebrae: Chronic L1 inferior endplate compression deformity without progressive height loss. Unchanged T10 compression  fracture there are mild L2 and L3 superior endplate compression deformities which are new as compared to prior CT abdomen/pelvis 12/08/2019. The bones are diffusely demineralized. Paraspinal and other soft tissues: Please refer to CT abdomen/pelvis separately reported for description of intra-abdominal findings. Atrophy of the lumbar paraspinal musculature. Disc levels: Unless otherwise stated, the level by level findings below have not significantly changed since prior MRI lumbar spine CT 11/22/2018. Multilevel disc degeneration. Most notably there is severe disc degeneration at L3-L4 and moderate/severe disc degeneration at L2-L3. T12-L1: Disc bulge, endplate spurring and mild bony retropulsion at the L1 level. Moderate bilateral bony neural foraminal narrowing. No significant spinal canal stenosis L1-L2: Disc bulge eccentric to the right. Endplate spurring. Mild T1 bony retropulsion. Moderate right neural foraminal narrowing. Mild right subarticular narrowing. Central canal patent L2-L3: Disc bulge asymmetric to the right. Endplate spurring. Mild facet arthrosis/ligamentum flavum hypertrophy. Mild right subarticular and central canal stenosis. Moderate bilateral neural foraminal narrowing. L3-L4: Disc bulge. Endplate spurring. Mild facet arthrosis/ligamentum flavum hypertrophy. Mild to moderate spinal canal stenosis. Moderate right with moderate/severe left neural foraminal narrowing. L4-L5: Grade 1 anterolisthesis with disc uncovering. Disc bulge. Facet/ligamentum flavum hypertrophy. Suspected at least moderate spinal canal stenosis. Severe bilateral neural foraminal narrowing. L5-S1: Grade 1 anterolisthesis. Disc uncovering. No significant spinal canal stenosis. Moderate bilateral neural foraminal narrowing (greater on the right). IMPRESSION: Mild L2 and L3 superior endplate compression deformities are new as compared to CT abdomen/pelvis 12/08/2019 and may be acute or subacute. Redemonstrated T9 and L1  compression deformities with unchanged mild L1 bony retropulsion. Lumbar spondylosis has not appreciably changed from lumbar spine CT 11/22/2018. Multilevel multifactorial spinal canal stenosis, greatest at L4-L5 (suspected at least moderate at this level). Multilevel neural foraminal narrowing as detailed and greatest on the left at L3-L4 (moderate/severe) and bilaterally at L4-L5 (severe). Please refer to concurrently performed CT abdomen/pelvis for soft tissue findings. Electronically Signed   By: Jackey Loge DO   On: 01/30/2020 18:27   DG Abd Acute W/Chest  Result Date: 01/30/2020 CLINICAL DATA:  Diffuse abdominal pain for the past 2 years. Previous appendectomy and hernia repair as well as abdominal hysterectomy. EXAM: DG ABDOMEN ACUTE W/ 1V CHEST COMPARISON:  Abdomen and pelvis CT dated 12/08/2019 and chest radiographs dated 11/22/2018, 09/07/2017 and 06/20/2012. FINDINGS: Stable mildly enlarged cardiac silhouette and tortuous and partially calcified thoracic aorta. Clear lungs with normal vascularity. Stable biapical pleural and parenchymal scarring. A small calcified granuloma seen in the lateral aspect of the right upper lung zone on 09/07/2017 does not have as well defined margins as previously. Diffuse osteopenia and multiple thoracic and lumbar vertebral compression deformities with progression. Mild scoliosis. Normal bowel gas pattern without free peritoneal air. Mildly prominent stool in the right colon. No free peritoneal air. IMPRESSION: 1. No acute abnormality. 2. Stable mild cardiomegaly. 3. Diffuse and multiple vertebral compression deformities with progression. Electronically Signed   By: Beckie Salts M.D.   On: 01/30/2020 16:36   IR KYPHO LUMBAR INC FX REDUCE BONE BX UNI/BIL CANNULATION INC/IMAGING  Result Date: 02/03/2020 CLINICAL DATA:  Symptomatic L2 and L3 compression fractures. EXAM: FLUOROSCOPIC GUIDED KYPHOPLASTY OF THE L2 AND L3 VERTEBRAL BODIES COMPARISON:  CT abdomen and pelvis  - 01/30/2020 MEDICATIONS: As antibiotic prophylaxis, Ancef 2 gm IV was ordered pre-procedure and administered intravenously within 1  hour of incision. ANESTHESIA/SEDATION: Moderate (conscious) sedation was employed during this procedure. A total of Versed 1 mg and Fentanyl 50 mcg was administered intravenously. Moderate Sedation Time: 30 minutes. The patient's level of consciousness and vital signs were monitored continuously by radiology nursing throughout the procedure under my direct supervision. FLUOROSCOPY TIME:  8 minutes, 6 seconds (291 mGy. COMPLICATIONS: None immediate. TECHNIQUE: The procedure, risks (including but not limited to bleeding, infection, organ damage), benefits, and alternatives were explained to the patient. Questions regarding the procedure were encouraged and answered. The patient understands and consents to the procedure. The patient was placed prone on the fluoroscopic table. The skin overlying the upper thoracic region was then prepped and draped in the usual sterile fashion. Maximal barrier sterile technique was utilized including caps, mask, sterile gowns, sterile gloves, sterile drape, hand hygiene and skin antiseptic. Intravenous Fentanyl and Versed were administered as conscious sedation during continuous cardiorespiratory monitoring by the radiology RN. The left pedicle at L2 was then infiltrated with 1% lidocaine followed by the advancement of a Kyphon trocar needle through the left pedicle into the posterior one-third of the vertebral body. Subsequently, the osteo drill was advanced to the anterior third of the vertebral body. The osteo drill was retracted. Through the working cannula, a Kyphon inflatable bone tamp 15 x 3 was advanced and positioned with the distal marker approximately 5 mm from the anterior aspect of the cortex. Appropriate positioning was confirmed on the AP projection. At this time, the balloon was expanded using contrast via a Kyphon inflation syringe device  via micro tubing. In similar fashion, the left L3 pedicle was infiltrated with 1% lidocaine followed by the advancement of a second Kyphon trocar needle through the right pedicle into the posterior third of the vertebral body. Subsequently, the osteo drill was coaxially advanced to the anterior right third. The osteo drill was exchanged for a Kyphon inflatable bone tamp 15 x 3, advanced to the 5 mm of the anterior aspect of the cortex. The balloon was then expanded using contrast as above. Inflations were performed under direct fluoroscopic guidance. At this time, methylmethacrylate mixture was reconstituted in the Kyphon bone mixing device system. This was then loaded into the delivery mechanism, attached to Kyphon bone fillers. The balloons were deflated and removed followed by the instillation of methylmethacrylate mixture with excellent filling in the AP and lateral projections. No extravasation was noted in the disk spaces or posteriorly into the spinal canal. A trace amount of epidural venous contamination with seen bilaterally at L2 and on the right side of the L3 vertebral body. The working cannulae and the bone fillers were then retrieved and removed. Hemostasis was achieved with manual compression. The patient tolerated the procedure well without immediate postprocedural complication. IMPRESSION: 1. Technically successful L2 and L3 vertebral body augmentation using balloon kyphoplasty. 2. Per CMS PQRS reporting requirements (PQRS Measure 24): Given the patient's age of greater than 50 and the fracture site (hip, distal radius, or spine), the patient should be tested for osteoporosis using DXA, and the appropriate treatment considered based on the DXA results. Electronically Signed   By: Simonne Come M.D.   On: 02/03/2020 12:33   IR KYPHO EA ADDL LEVEL THORACIC OR LUMBAR  Result Date: 02/03/2020 CLINICAL DATA:  Symptomatic L2 and L3 compression fractures. EXAM: FLUOROSCOPIC GUIDED KYPHOPLASTY OF THE L2  AND L3 VERTEBRAL BODIES COMPARISON:  CT abdomen and pelvis - 01/30/2020 MEDICATIONS: As antibiotic prophylaxis, Ancef 2 gm IV was ordered pre-procedure and administered  intravenously within 1 hour of incision. ANESTHESIA/SEDATION: Moderate (conscious) sedation was employed during this procedure. A total of Versed 1 mg and Fentanyl 50 mcg was administered intravenously. Moderate Sedation Time: 30 minutes. The patient's level of consciousness and vital signs were monitored continuously by radiology nursing throughout the procedure under my direct supervision. FLUOROSCOPY TIME:  8 minutes, 6 seconds (291 mGy. COMPLICATIONS: None immediate. TECHNIQUE: The procedure, risks (including but not limited to bleeding, infection, organ damage), benefits, and alternatives were explained to the patient. Questions regarding the procedure were encouraged and answered. The patient understands and consents to the procedure. The patient was placed prone on the fluoroscopic table. The skin overlying the upper thoracic region was then prepped and draped in the usual sterile fashion. Maximal barrier sterile technique was utilized including caps, mask, sterile gowns, sterile gloves, sterile drape, hand hygiene and skin antiseptic. Intravenous Fentanyl and Versed were administered as conscious sedation during continuous cardiorespiratory monitoring by the radiology RN. The left pedicle at L2 was then infiltrated with 1% lidocaine followed by the advancement of a Kyphon trocar needle through the left pedicle into the posterior one-third of the vertebral body. Subsequently, the osteo drill was advanced to the anterior third of the vertebral body. The osteo drill was retracted. Through the working cannula, a Kyphon inflatable bone tamp 15 x 3 was advanced and positioned with the distal marker approximately 5 mm from the anterior aspect of the cortex. Appropriate positioning was confirmed on the AP projection. At this time, the balloon was  expanded using contrast via a Kyphon inflation syringe device via micro tubing. In similar fashion, the left L3 pedicle was infiltrated with 1% lidocaine followed by the advancement of a second Kyphon trocar needle through the right pedicle into the posterior third of the vertebral body. Subsequently, the osteo drill was coaxially advanced to the anterior right third. The osteo drill was exchanged for a Kyphon inflatable bone tamp 15 x 3, advanced to the 5 mm of the anterior aspect of the cortex. The balloon was then expanded using contrast as above. Inflations were performed under direct fluoroscopic guidance. At this time, methylmethacrylate mixture was reconstituted in the Kyphon bone mixing device system. This was then loaded into the delivery mechanism, attached to Kyphon bone fillers. The balloons were deflated and removed followed by the instillation of methylmethacrylate mixture with excellent filling in the AP and lateral projections. No extravasation was noted in the disk spaces or posteriorly into the spinal canal. A trace amount of epidural venous contamination with seen bilaterally at L2 and on the right side of the L3 vertebral body. The working cannulae and the bone fillers were then retrieved and removed. Hemostasis was achieved with manual compression. The patient tolerated the procedure well without immediate postprocedural complication. IMPRESSION: 1. Technically successful L2 and L3 vertebral body augmentation using balloon kyphoplasty. 2. Per CMS PQRS reporting requirements (PQRS Measure 24): Given the patient's age of greater than 50 and the fracture site (hip, distal radius, or spine), the patient should be tested for osteoporosis using DXA, and the appropriate treatment considered based on the DXA results. Electronically Signed   By: Simonne Come M.D.   On: 02/03/2020 12:33    (Echo, Carotid, EGD, Colonoscopy, ERCP)    Subjective: Very pleasant young lady in no acute  distress  Discharge Exam: Vitals:   02/04/20 0230 02/04/20 0617  BP: (!) 149/70 (!) 162/87  Pulse: 93 92  Resp: 20 20  Temp: 97.9 F (36.6 C) 98.7 F (  37.1 C)  SpO2: 96% 98%   Vitals:   02/03/20 2036 02/03/20 2330 02/04/20 0230 02/04/20 0617  BP: (!) 168/94 125/72 (!) 149/70 (!) 162/87  Pulse: 95 85 93 92  Resp: Temp: 98.7 F (37.1 C) 98 F (36.7 C) 97.9 F (36.6 C) 98.7 F (37.1 C)  TempSrc: Oral Oral Oral Oral  SpO2: 97% 97% 96% 98%  Weight:      Height:        General: Pt is alert, awake, not in acute distress Cardiovascular: RRR, S1/S2 +, no rubs, no gallops Respiratory: CTA bilaterally, no wheezing, no rhonchi Abdominal: Soft, NT, ND, bowel sounds + Extremities: no edema, no cyanosis    The results of significant diagnostics from this hospitalization (including imaging, microbiology, ancillary and laboratory) are listed below for reference.     Microbiology: Recent Results (from the past 240 hour(s))  Blood culture (routine x 2)     Status: None   Collection Time: 01/30/20  4:39 PM   Specimen: BLOOD LEFT FOREARM  Result Value Ref Range Status   Specimen Description   Final    BLOOD LEFT FOREARM Performed at [facility], [address], [place], [place] [address]    Special Requests   Final    BOTTLES DRAWN AEROBIC AND ANAEROBIC Blood Culture results may not be optimal due to an inadequate volume of blood received in culture bottles Performed at [facility], [address] [place], [place] [address]    Culture   Final    NO GROWTH 5 DAYS Performed at [facility], [address] [place], [place] [address]    Report Status 02/04/2020 FINAL  Final  Urine culture     Status: Abnormal   Collection Time: 01/30/20  4:45 PM   Specimen: Urine, Random  Result Value Ref Range Status   Specimen Description   Final    URINE, RANDOM Performed at [facility], [address]  [place], [place] [address]    Special Requests   Final    NONE Performed at [facility], [address] [place], [place] [address]    Culture (A)  Final    <10,000 COLONIES/mL INSIGNIFICANT GROWTH Performed at [facility], [address], [place], [place] [address]    Report Status 02/01/2020 FINAL  Final  Blood culture (routine x 2)     Status: None   Collection Time: 01/30/20  5:56 PM   Specimen: BLOOD LEFT HAND  Result Value Ref Range Status   Specimen Description   Final    BLOOD LEFT HAND Performed at [facility], [address], [place], [place] [address]    Special Requests   Final    BOTTLES DRAWN AEROBIC ONLY Blood Culture results may not be optimal due to an inadequate volume of blood received in culture bottles Performed at [facility], [address] [place], [place] [address]    Culture   Final    NO GROWTH 5 DAYS Performed at [facility], [address], [place], [place] [address]    Report Status 02/04/2020 FINAL  Final  Respiratory Panel by RT PCR (Flu A&B, Covid) - Nasopharyngeal Swab     Status: None   Collection Time: 01/30/20  5:56 PM   Specimen: Nasopharyngeal Swab  Result Value Ref Range Status   SARS Coronavirus 2 by RT PCR NEGATIVE NEGATIVE Final  Comment: (NOTE) SARS-CoV-2 target nucleic acids are NOT DETECTED. The SARS-CoV-2 RNA is generally detectable in upper respiratoy specimens during the acute phase of infection. The lowest concentration of SARS-CoV-2 viral copies this assay can detect is 131 copies/mL. A negative result does not preclude SARS-Cov-2 infection and should not be used as the sole basis for treatment or other patient management decisions. A negative result may occur with  improper specimen collection/handling, submission of specimen other than nasopharyngeal swab, presence of viral mutation(s) within the areas targeted by this assay, and inadequate number of  viral copies (<131 copies/mL). A negative result must be combined with clinical observations, patient history, and epidemiological information. The expected result is Negative. Fact Sheet for Patients:  https://www.moore.com/ Fact Sheet for Healthcare Providers:  https://www.young.biz/ This test is not yet ap proved or cleared by the [place] FDA and  has been authorized for detection and/or diagnosis of SARS-CoV-2 by FDA under an Emergency Use Authorization (EUA). This EUA will remain  in effect (meaning this test can be used) for the duration of the COVID-19 declaration under Section 564(b)(1) of the Act, 21 U.S.C. section 360bbb-3(b)(1), unless the authorization is terminated or revoked sooner.    Influenza A by PCR NEGATIVE NEGATIVE Final   Influenza B by PCR NEGATIVE NEGATIVE Final    Comment: (NOTE) The Xpert Xpress SARS-CoV-2/FLU/RSV assay is intended as an aid in  the diagnosis of influenza from Nasopharyngeal swab specimens and  should not be used as a sole basis for treatment. Nasal washings and  aspirates are unacceptable for Xpert Xpress SARS-CoV-2/FLU/RSV  testing. Fact Sheet for Patients: https://www.moore.com/ Fact Sheet for Healthcare Providers: https://www.young.biz/ This test is not yet approved or cleared by the [place] FDA and  has been authorized for detection and/or diagnosis of SARS-CoV-2 by  FDA under an Emergency Use Authorization (EUA). This EUA will remain  in effect (meaning this test can be used) for the duration of the  Covid-19 declaration under Section 564(b)(1) of the Act, 21  U.S.C. section 360bbb-3(b)(1), unless the authorization is  terminated or revoked. Performed at [facility], [address] [place], [place] [address]   SARS CORONAVIRUS 2 (TAT 6-24 HRS) Nasopharyngeal Nasopharyngeal Swab     Status: None   Collection Time: 02/02/20   2:33 PM   Specimen: Nasopharyngeal Swab  Result Value Ref Range Status   SARS Coronavirus 2 NEGATIVE NEGATIVE Final    Comment: (NOTE) SARS-CoV-2 target nucleic acids are NOT DETECTED. The SARS-CoV-2 RNA is generally detectable in upper and lower respiratory specimens during the acute phase of infection. Negative results do not preclude SARS-CoV-2 infection, do not rule out co-infections with other pathogens, and should not be used as the sole basis for treatment or other patient management decisions. Negative results must be combined with clinical observations, patient history, and epidemiological information. The expected result is Negative. Fact Sheet for Patients: HairSlick.no Fact Sheet for Healthcare Providers: quierodirigir.com This test is not yet approved or cleared by the [place] FDA and  has been authorized for detection and/or diagnosis of SARS-CoV-2 by FDA under an Emergency Use Authorization (EUA). This EUA will remain  in effect (meaning this test can be used) for the duration of the COVID-19 declaration under Section 56 4(b)(1) of the Act, 21 U.S.C. section 360bbb-3(b)(1), unless the authorization is terminated or revoked sooner. Performed at [facility], [address], [place], [place] [address]      Labs: BNP (last 3 results) No results  for input(s): BNP in the last 8760 hours. Basic Metabolic Panel: Recent Labs  Lab 01/30/20 1637 01/30/20 1722 01/31/20 0438  NA 136 137 137  K 3.4* 3.5 3.6  CL 105 102 106  CO2 24  --  25  GLUCOSE 91 84 74  BUN CREATININE 0.75 0.60 0.56  CALCIUM 8.4*  --  8.6*   Liver Function Tests: Recent Labs  Lab 01/30/20 1637  AST 23  ALT 14  ALKPHOS 81  BILITOT 0.9  PROT 7.1  ALBUMIN 3.9   Recent Labs  Lab 01/30/20 1637  LIPASE 34   No results for input(s): AMMONIA in the last 168 hours. CBC: Recent Labs  Lab 01/30/20 1637  01/30/20 1722 01/31/20 0438 02/03/20 1516  WBC 6.8  --  5.1 10.1  NEUTROABS 4.5  --   --   --   HGB 12.2 13.9 13.1 12.4  HCT 37.0 41.0 41.9 38.5  MCV 95.4  --  97.9 95.8  PLT 195  --  194 213   Cardiac Enzymes: Recent Labs  Lab 01/30/20 1640  CKTOTAL 93   BNP: Invalid input(s): POCBNP CBG: No results for input(s): GLUCAP in the last 168 hours. D-Dimer No results for input(s): DDIMER in the last 72 hours. Hgb A1c No results for input(s): HGBA1C in the last 72 hours. Lipid Profile No results for input(s): CHOL, HDL, LDLCALC, TRIG, CHOLHDL, LDLDIRECT in the last 72 hours. Thyroid function studies No results for input(s): TSH, T4TOTAL, T3FREE, THYROIDAB in the last 72 hours.  Invalid input(s): FREET3 Anemia work up No results for input(s): VITAMINB12, FOLATE, FERRITIN, TIBC, IRON, RETICCTPCT in the last 72 hours. Urinalysis    Component Value Date/Time   COLORURINE STRAW (A) 01/30/2020 1637   APPEARANCEUR CLEAR 01/30/2020 1637   LABSPEC 1.006 01/30/2020 1637   PHURINE 8.0 01/30/2020 1637   GLUCOSEU 50 (A) 01/30/2020 1637   HGBUR NEGATIVE 01/30/2020 1637   BILIRUBINUR NEGATIVE 01/30/2020 1637   KETONESUR NEGATIVE 01/30/2020 1637   PROTEINUR NEGATIVE 01/30/2020 1637   NITRITE NEGATIVE 01/30/2020 1637   LEUKOCYTESUR NEGATIVE 01/30/2020 1637   Sepsis Labs Invalid input(s): PROCALCITONIN,  WBC,  LACTICIDVEN Microbiology Recent Results (from the past 240 hour(s))  Blood culture (routine x 2)     Status: None   Collection Time: 01/30/20  4:39 PM   Specimen: BLOOD LEFT FOREARM  Result Value Ref Range Status   Specimen Description   Final    BLOOD LEFT FOREARM Performed at [facility], [address] [place], [place] [address]    Special Requests   Final    BOTTLES DRAWN AEROBIC AND ANAEROBIC Blood Culture results may not be optimal due to an inadequate volume of blood received in culture bottles Performed at [facility], [address], [place], [place] [address]    Culture   Final    NO GROWTH 5 DAYS Performed at [facility], [address] [place], [place] [address]    Report Status 02/04/2020 FINAL  Final  Urine culture     Status: Abnormal   Collection Time: 01/30/20  4:45 PM   Specimen: Urine, Random  Result Value Ref Range Status   Specimen Description   Final    URINE, RANDOM Performed at [facility], [address], [place], [place] [address]    Special Requests   Final    NONE Performed at [facility], [address] [place], [place]  [address]    Culture (A)  Final    <10,000 COLONIES/mL INSIGNIFICANT GROWTH Performed at [facility], [address], [place], [place] [address]    Report Status 02/01/2020 FINAL  Final  Blood culture (routine x 2)     Status: None   Collection Time: 01/30/20  5:56 PM   Specimen: BLOOD LEFT HAND  Result Value Ref Range Status   Specimen Description   Final    BLOOD LEFT HAND Performed at [facility], [address] [place], [place] [address]    Special Requests   Final    BOTTLES DRAWN AEROBIC ONLY Blood Culture results may not be optimal due to an inadequate volume of blood received in culture bottles Performed at [facility], [address], [place], [place] [address]    Culture   Final    NO GROWTH 5 DAYS Performed at [facility], [address] [place], [place] [address]    Report Status 02/04/2020 FINAL  Final  Respiratory Panel by RT PCR (Flu A&B, Covid) - Nasopharyngeal Swab     Status: None   Collection Time: 01/30/20  5:56 PM   Specimen: Nasopharyngeal Swab  Result Value Ref Range Status   SARS Coronavirus 2 by RT PCR NEGATIVE NEGATIVE Final    Comment: (NOTE) SARS-CoV-2 target nucleic acids are NOT DETECTED. The SARS-CoV-2 RNA is generally detectable in upper respiratoy specimens during the acute phase of infection. The  lowest concentration of SARS-CoV-2 viral copies this assay can detect is 131 copies/mL. A negative result does not preclude SARS-Cov-2 infection and should not be used as the sole basis for treatment or other patient management decisions. A negative result may occur with  improper specimen collection/handling, submission of specimen other than nasopharyngeal swab, presence of viral mutation(s) within the areas targeted by this assay, and inadequate number of viral copies (<131 copies/mL). A negative result must be combined with clinical observations, patient history, and epidemiological information. The expected result is Negative. Fact Sheet for Patients:  https://www.moore.com/ Fact Sheet for Healthcare Providers:  https://www.young.biz/ This test is not yet ap proved or cleared by the [place] FDA and  has been authorized for detection and/or diagnosis of SARS-CoV-2 by FDA under an Emergency Use Authorization (EUA). This EUA will remain  in effect (meaning this test can be used) for the duration of the COVID-19 declaration under Section 564(b)(1) of the Act, 21 U.S.C. section 360bbb-3(b)(1), unless the authorization is terminated or revoked sooner.    Influenza A by PCR NEGATIVE NEGATIVE Final   Influenza B by PCR NEGATIVE NEGATIVE Final    Comment: (NOTE) The Xpert Xpress SARS-CoV-2/FLU/RSV assay is intended as an aid in  the diagnosis of influenza from Nasopharyngeal swab specimens and  should not be used as a sole basis for treatment. Nasal washings and  aspirates are unacceptable for Xpert Xpress SARS-CoV-2/FLU/RSV  testing. Fact Sheet for Patients: https://www.moore.com/ Fact Sheet for Healthcare Providers: https://www.young.biz/ This test is not yet approved or cleared by the [place] FDA and  has been authorized for detection and/or diagnosis of SARS-CoV-2 by  FDA under an Emergency  Use Authorization (EUA). This EUA will remain  in effect (meaning this test can be used) for the duration of the  Covid-19 declaration under Section 564(b)(1) of the Act, 21  U.S.C. section 360bbb-3(b)(1), unless the authorization is  terminated or revoked. Performed at [facility], [address], [place], [place] [address]  SARS CORONAVIRUS 2 (TAT 6-24 HRS) Nasopharyngeal Nasopharyngeal Swab     Status: None   Collection Time: 02/02/20  2:33 PM   Specimen: Nasopharyngeal Swab  Result Value Ref Range Status   SARS Coronavirus 2 NEGATIVE NEGATIVE Final    Comment: (NOTE) SARS-CoV-2 target nucleic acids are NOT DETECTED. The SARS-CoV-2 RNA is generally detectable in upper and lower respiratory specimens during the acute phase of infection. Negative results do not preclude SARS-CoV-2 infection, do not rule out co-infections with other pathogens, and should not be used as the sole basis for treatment or other patient management decisions. Negative results must be combined with clinical observations, patient history, and epidemiological information. The expected result is Negative. Fact Sheet for Patients: HairSlick.nohttps://www.fda.gov/media/138098/download Fact Sheet for Healthcare Providers: quierodirigir.comhttps://www.fda.gov/media/138095/download This test is not yet approved or cleared by the [place] FDA and  has been authorized for detection and/or diagnosis of SARS-CoV-2 by FDA under an Emergency Use Authorization (EUA). This EUA will remain  in effect (meaning this test can be used) for the duration of the COVID-19 declaration under Section 56 4(b)(1) of the Act, 21 U.S.C. section 360bbb-3(b)(1), unless the authorization is terminated or revoked sooner. Performed at [facility], [address] [place], [place] [address]      Time coordinating discharge: 36 minutes  SIGNED:   Alwyn RenElizabeth G Jamee Keach, MD  [facility] 02/04/2020, 9:26 AM Pager   If  7PM-7AM, please contact night-coverage [email or web address] Password [email or web address]

## 2020-02-08 ENCOUNTER — Non-Acute Institutional Stay: Payer: Medicare Other | Admitting: Internal Medicine

## 2020-02-10 ENCOUNTER — Other Ambulatory Visit: Payer: Self-pay

## 2020-02-17 ENCOUNTER — Telehealth: Payer: Self-pay | Admitting: *Deleted

## 2020-02-17 ENCOUNTER — Other Ambulatory Visit: Payer: Self-pay | Admitting: Interventional Radiology

## 2020-02-17 DIAGNOSIS — S32000A Wedge compression fracture of unspecified lumbar vertebra, initial encounter for closed fracture: Secondary | ICD-10-CM

## 2020-02-17 NOTE — Telephone Encounter (Signed)
Called [facility] to see how Tara Nolan is doing from Kyphoplasty and the nurse states they cant keep up with her because she is walking everywhere, doing great./vm

## 2020-02-24 ENCOUNTER — Non-Acute Institutional Stay: Payer: Medicare Other | Admitting: Internal Medicine

## 2020-02-25 ENCOUNTER — Other Ambulatory Visit: Payer: Self-pay

## 2020-04-11 NOTE — Progress Notes (Signed)
May 18th, 2021 [facility] Palliative Care Consult Note Telephone: [phone]  Fax: [phone]  PATIENT NAME: Tara Nolan DOB: 30-Nov-1918 MRN: [id number] [place] [address] Room 30. Move in date 03/14/2020  PRIMARY CARE PROVIDER:  [name], MD [address] [place],  [place] [address]  REFERRING PROVIDER: Mikael Spray NP (O) [phone].   RESPONSIBLE PARTY: (dtr/[place]) Candy Hausman [phone]. John Hausman [phone]. (son/HCA/GBO) Tom Huser [phone]  ASSESSMENT / RECOMMENDATIONS:  1. Advance Care Planning: A. Directives: DNR/MOST: DNR/DNI. Limited Additional Scope of Medical Interventions Yes to Antibiotics, Yes to IVFs. No Feeding Tube. Forms present in facility chart; I uploaded into [facility] EMR. B. Goals of Care:  Patient to acclimate to life at [facility], and be happy there.  2. Symptom Management:  Spoke with son Elijah Birk, who visits patient qam (they share coffee/brings fruit). Confirm rambling speech. Discussed f/u with urology. Discussed improved appetite/spirits. Recommended celexa in future if signs rebound depression. Prob situational with change in residence; seems to be tucking in okay. Son would like to encourage patient going to the dining room for meals  3. Cognitive / Functional status:  Patient is alert and conversant. He speech is clear but her thought process can be rambling and tangential. She knows she isn't home but couldn't remember the name of the facility. She recognizes her family and staff members. Greatly enjoys her son's qam visits with coffee and fruits. Staff report patient with depressed affect earlier this month, with poor oral intake, expressing wishes that she was dead. I think she was having some adjustment disorder with transition to the facility. Today finds her in much better spirits. She is upbeat though still wishing she was back living in her old home with her old neighbors. She remembers her  upcoming ophthalmology appointment to fix her lower eyelashes impinging on her eye. Elijah Birk believes patient's affect improved since starting Trazodone; better since getting more rest. Also, recent adjustment upwards of her Thyroid medicine.  Patient can transfer and toilet independently. Mostly continent of urine. She tends to stay in her wheelchair and push herself about with her feet. She forgets to use her walker when up and about.   Move in weight 03/14/20 was 84.5lbs, which was a loss of 10.9 lbs (11.4% of body weight) from over the prior month. At a height of 5'3" her BMI was 15kg/m2. Staff report patient with improved appetite over this past month. Consumes most of 3 meals/day. Son Elijah Birk believes patient as gained weight over this last month.  Past pain from lumbar compression fractures is much improved after kyphoplasty in March.   -discussed with Tom that patient should f/u with urologist (urinary retention recent hospital admission)  4. Family Supports:  Patient is a retired Careers information officer. She is widowed and has 2 children; son Elijah Birk (GBO) and Sonny Masters (who resides in [place]),  5. Follow up Palliative Care Visit:  I spent 60 minutes providing this consultation from 8:30am to 9:30am. More than 50% of the time in this consultation was spent coordinating communication.   HISTORY OF PRESENT ILLNESS:  Tara Nolan is a 86 y.o. female with h/o CVA, hypothyroid, adverse reaction to vaccine, dysphagia (severe aspiration risk; no artificial feeding; ok for comfort feedings), urinary retention (foley last hosp adm; f/u [facility])  3/6-3/09/2020: Hospitalized (from home) with severe lower back pain 2/2 compression fx (acute L2 and L3; chronic L1) lumbar vertebra. Kyphoplasty 02/03/2020 (tylenol/baclofen/gabapentin/heating pad). Discharged to [facility].  Palliative  Care was asked consult d/t patient's functional decline and depressed affect.    CODE STATUS: DNR  PPS: 40% HOSPICE  ELIGIBILITY/DIAGNOSIS: TBD  PAST MEDICAL HISTORY:  Past Medical History:  Diagnosis Date  . Stroke ([place])   . Thyroid disease     SOCIAL HX:  Social History   Tobacco Use  . Smoking status: Never Smoker  . Smokeless tobacco: Never Used  Substance Use Topics  . Alcohol use: No    ALLERGIES:  Allergies  Allergen Reactions  . Other Other (See Comments)    All medications make me bonkers      PERTINENT MEDICATIONS:  Outpatient Encounter Medications as of 04/12/2020  Medication Sig  . acetaminophen (TYLENOL) 325 MG tablet Take 1 tablet (325 mg total) by mouth every 6 (six) hours.  . baclofen 5 MG TABS Take 5 mg by mouth 2 (two) times daily as needed for muscle spasms.  Marland Kitchen gabapentin (NEURONTIN) 100 MG capsule Take 100 mg by mouth 3 (three) times daily.   Marland Kitchen levothyroxine (SYNTHROID) 25 MCG tablet Take 1 tablet (25 mcg total) by mouth daily at 6 (six) AM.  . ondansetron (ZOFRAN) 4 MG tablet Take 1 tablet (4 mg total) by mouth every 6 (six) hours as needed for nausea.  . polyethylene glycol (MIRALAX / GLYCOLAX) 17 g packet Take 17 g by mouth daily.  Marland Kitchen senna-docusate (SENOKOT-S) 8.6-50 MG tablet Take 1 tablet by mouth at bedtime as needed for mild constipation or moderate constipation.   No facility-administered encounter medications on file as of 04/12/2020.    PHYSICAL EXAM:   BP 98/80  General: NAD, frail appearing, thin Cardiovascular: regular rate and rhythm, systolic M loudest LL sternal boarder. Pulmonary: clear ant fields Abdomen: soft, nontender, + bowel sounds GU: no suprapubic tenderness Extremities: no edema, no joint deformities Skin: no rashes Neurological: Weakness but otherwise non-focal  Julianne Handler, NP

## 2020-04-12 ENCOUNTER — Other Ambulatory Visit: Payer: Self-pay

## 2020-04-12 ENCOUNTER — Encounter: Payer: Self-pay | Admitting: Internal Medicine

## 2020-04-12 ENCOUNTER — Non-Acute Institutional Stay: Payer: Medicare Other | Admitting: Internal Medicine

## 2020-04-12 VITALS — Ht 65.0 in | Wt 84.5 lb

## 2020-04-12 DIAGNOSIS — Z515 Encounter for palliative care: Secondary | ICD-10-CM

## 2020-04-12 DIAGNOSIS — Z7189 Other specified counseling: Secondary | ICD-10-CM

## 2020-04-15 ENCOUNTER — Emergency Department (HOSPITAL_COMMUNITY): Payer: Medicare Other

## 2020-04-15 ENCOUNTER — Emergency Department (HOSPITAL_COMMUNITY)
Admission: EM | Admit: 2020-04-15 | Discharge: 2020-04-16 | Disposition: A | Discharge status: Home / Self Care | Payer: Medicare Other | Attending: Emergency Medicine | Admitting: Emergency Medicine

## 2020-04-15 ENCOUNTER — Other Ambulatory Visit: Payer: Self-pay

## 2020-04-15 ENCOUNTER — Encounter (HOSPITAL_COMMUNITY): Payer: Self-pay | Admitting: Emergency Medicine

## 2020-04-15 DIAGNOSIS — R0789 Other chest pain: Secondary | ICD-10-CM | POA: Diagnosis not present

## 2020-04-15 DIAGNOSIS — Z8673 Personal history of transient ischemic attack (TIA), and cerebral infarction without residual deficits: Secondary | ICD-10-CM | POA: Insufficient documentation

## 2020-04-15 DIAGNOSIS — R071 Chest pain on breathing: Secondary | ICD-10-CM | POA: Diagnosis not present

## 2020-04-15 DIAGNOSIS — E039 Hypothyroidism, unspecified: Secondary | ICD-10-CM | POA: Diagnosis not present

## 2020-04-15 DIAGNOSIS — W19XXXA Unspecified fall, initial encounter: Secondary | ICD-10-CM

## 2020-04-15 DIAGNOSIS — R079 Chest pain, unspecified: Secondary | ICD-10-CM | POA: Diagnosis present

## 2020-04-15 DIAGNOSIS — I10 Essential (primary) hypertension: Secondary | ICD-10-CM | POA: Insufficient documentation

## 2020-04-15 MED ORDER — ACETAMINOPHEN 325 MG PO TABS
650.0000 mg | ORAL_TABLET | Freq: Once | ORAL | Status: AC
  Administered 2020-04-15: 650 mg via ORAL
  Filled 2020-04-15: qty 2

## 2020-04-15 NOTE — ED Provider Notes (Signed)
[facility] DEPT Provider Note   CSN: [id number] Arrival date & time: 04/15/20  2105     History Chief Complaint  Patient presents with  . Fall    center chest pain     Tara Nolan is a 86 y.o. female.  Patient with a history of dementia presents from [facility] for evaluation of chest pain since yesterday. History is obtained from nursing home RN and from son by phone. No witnessed fall. The patient told her daughter she bent over and felt a "pop", but told RN she fell. The patient points to her lower center chest. She states she feels the pain when she moves, breathes deeply or pushes. No abdominal pain, vomiting, cough. Per RN and son, there is no change in her mental status.   The history is provided by the patient and a relative. No language interpreter was used.  Fall Associated symptoms include chest pain. Pertinent negatives include no abdominal pain and no shortness of breath.       Past Medical History:  Diagnosis Date  . Stroke ([place])   . Thyroid disease     Patient Active Problem List   Diagnosis Date Noted  . Adverse reaction to vaccine, initial encounter 01/30/2020  . Compression fracture of lumbar vertebra ([place]) 01/30/2020  . Syncope and collapse 11/22/2018  . Back pain 11/22/2018  . Hypothyroid 11/22/2018  . History of CVA (cerebrovascular accident) 09/12/2017  . Fall 09/08/2017  . Right shoulder pain 09/08/2017  . Left wrist fracture 09/08/2017  . History of thyroid disease 09/08/2017  . Chest discomfort 09/08/2017  . Hip pain, acute 06/20/2012  . Knee pain 06/20/2012  . Hamstring tendon rupture 06/20/2012    Past Surgical History:  Procedure Laterality Date  . ABDOMINAL HYSTERECTOMY    . APPENDECTOMY    . HERNIA REPAIR    . IR KYPHO EA ADDL LEVEL THORACIC OR LUMBAR  02/03/2020  . IR KYPHO LUMBAR INC FX REDUCE BONE BX UNI/BIL CANNULATION INC/IMAGING  02/03/2020  . THYROIDECTOMY       OB History    No obstetric history on file.     No family history on file.  Social History   Tobacco Use  . Smoking status: Never Smoker  . Smokeless tobacco: Never Used  Substance Use Topics  . Alcohol use: No  . Drug use: No    Home Medications Prior to Admission medications   Medication Sig Start Date End Date Taking? Authorizing Provider  acetaminophen (TYLENOL) 325 MG tablet Take 1 tablet (325 mg total) by mouth every 6 (six) hours. Patient taking differently: Take 650 mg by mouth daily as needed. Two tabs qd prn 02/04/20   [name], MD  amLODipine (NORVASC) 5 MG tablet Take 1 tablet (5 mg total) by mouth daily. 04/16/20   [name], PA-C  carboxymethylcellulose 1 % ophthalmic solution 2 drops every 2 (two) hours as needed. As needed for dry eyes    [provider]  HM LIDOCAINE PATCH 4 % PTCH Apply topically. Apply to lower back in am and remove qhs    [provider]  levothyroxine (SYNTHROID) 50 MCG tablet Take 50 mcg by mouth daily before breakfast.    [provider]  polyethylene glycol (MIRALAX / GLYCOLAX) 17 g packet Take 17 g by mouth daily. 02/04/20   [name], MD  senna-docusate (SENOKOT-S) 8.6-50 MG tablet Take 1 tablet by mouth at bedtime as needed for mild constipation or  moderate constipation. 02/04/20   [name], MD  traZODone (DESYREL) 50 MG tablet Take 50 mg by mouth at bedtime.    [provider]    Allergies    Other  Review of Systems   Review of Systems  Constitutional: Negative for fever.  Respiratory: Negative for cough and shortness of breath.   Cardiovascular: Positive for chest pain.  Gastrointestinal: Negative for abdominal pain and vomiting.  Musculoskeletal: Positive for back pain (Chronic low back pain, recent h/o kyphoplasty).    Physical Exam Updated Vital Signs BP (!) 185/93   Pulse 83   Temp 97.7 F (36.5 C)   Resp 18   SpO2 97%   Physical Exam Vitals and nursing  note reviewed.  Constitutional:      General: She is not in acute distress. HENT:     Head:     Comments: There is facial bruising under her left eye - per son, from recent ophthalmic procedure.    Nose: Nose normal.  Cardiovascular:     Rate and Rhythm: Normal rate and regular rhythm.     Heart sounds: No murmur.  Pulmonary:     Effort: Pulmonary effort is normal.     Breath sounds: No wheezing, rhonchi or rales.  Chest:     Chest wall: Tenderness present.       Comments: There is moderate tenderness to palpation of the lower sternum.  Abdominal:     General: There is no distension.     Palpations: Abdomen is soft.     Tenderness: There is no abdominal tenderness.  Musculoskeletal:        General: Normal range of motion.     Cervical back: Normal range of motion and neck supple.  Skin:    General: Skin is warm and dry.  Neurological:     General: No focal deficit present.     Mental Status: She is alert.     ED Results / Procedures / Treatments   Labs (all labs ordered are listed, but only abnormal results are displayed) Labs Reviewed - No data to display  EKG None EKG: normal sinus rhythm, RBBB, LAFB. No significant change from previous. Confirmed by Dr. Fredderick Phenix  Radiology DG Chest 2 View  Result Date: 04/15/2020 CLINICAL DATA:  Larey Seat yesterday, sternal chest pain EXAM: CHEST - 2 VIEW COMPARISON:  11/22/2018 FINDINGS: Frontal and lateral views of the chest demonstrate a stable cardiac silhouette. Marked ectasia and atherosclerosis of the thoracic aorta. No acute airspace disease, effusion, or pneumothorax. Chronic scarring within the right mid lung zone and bilateral apices. No acute displaced fractures. IMPRESSION: 1. Stable exam, no acute process. Electronically Signed   By: Sharlet Salina M.D.   On: 04/15/2020 22:28   DG Sternum  Result Date: 04/15/2020 CLINICAL DATA:  Larey Seat yesterday, sternal chest pain EXAM: STERNUM - 2+ VIEW COMPARISON:  11/22/2018 FINDINGS:  Lateral view of the sternum is performed. No acute displaced fracture. Visualized soft tissues are unremarkable. IMPRESSION: 1. No acute displaced fracture. Electronically Signed   By: Sharlet Salina M.D.   On: 04/15/2020 22:26    Procedures Procedures (including critical care time)  Medications Ordered in ED Medications  acetaminophen (TYLENOL) tablet 650 mg (650 mg Oral Given 04/15/20 2345)    ED Course  I have reviewed the triage vital signs and the nursing notes.  Pertinent labs & imaging results that were available during my care of the patient were reviewed by me and considered in my medical  decision making (see chart for details).    MDM Rules/Calculators/A&P                      Patient to ED for evaluation of chest pain per HPI.   Patient appears well, in NAD. Blood pressure elevated. EKG unchanged from previous. There is reproducible chest wall tenderness. CXR, sternum x-ray both negative for fracture.   She is felt to have chest wall pain, however, unclear mechanism. Do not feel ACS, infection, dissection, PE are likely causes of her pain. She appears stable for discharge back to the nursing home.   Chart reviewed. Her blood pressure has been elevated on multiple hospital visits. On arrival here it is 202/111, now 185/93. Feel she would benefit from starting a low dose antihypertensive medication. Rx Norvasc 5 mg provided.   Final Clinical Impression(s) / ED Diagnoses Final diagnoses:  Fall  Chest wall pain  Hypertension, unspecified type    Rx / DC Orders ED Discharge Orders         Ordered    amLODipine (NORVASC) 5 MG tablet  Daily     04/16/20 0038           Elpidio Anis, PA-C 04/16/20 0050    Rolan Bucco, MD 04/16/20 1457

## 2020-04-15 NOTE — ED Triage Notes (Addendum)
Pt is a 86 yr old female comes to ed via ems, pt comes from [facility], c/o pt had a mechanical fall yesterday morning , and today complains of sternal chest pain. Pts daughter requested her to be evaluated at Ed. Pt v/s on arrival 188/116, pluses 80, rr18, spo2 96 room air. Pt DNR. Pt was seen at eye doctor yesterday.

## 2020-04-16 DIAGNOSIS — R0789 Other chest pain: Secondary | ICD-10-CM | POA: Diagnosis not present

## 2020-04-16 MED ORDER — AMLODIPINE BESYLATE 5 MG PO TABS: 5.0000 mg | ORAL_TABLET | Freq: Every day | ORAL | 0 refills | Status: AC

## 2020-04-16 NOTE — Discharge Instructions (Signed)
Images of your chest do not show any fractures. Continue Tylenol for any pain.   Your blood pressure tonight was elevated. Review of your chart shows that it has been elevated on multiple previous hospital encounters. You have been prescribed Norvasc 5 mg to take daily for blood pressure control. This should be discussed with your doctor.

## 2020-08-21 ENCOUNTER — Emergency Department (HOSPITAL_COMMUNITY): Payer: Medicare Other

## 2020-08-21 ENCOUNTER — Emergency Department (HOSPITAL_COMMUNITY)
Admission: EM | Admit: 2020-08-21 | Discharge: 2020-08-21 | Disposition: A | Discharge status: Home / Self Care | Payer: Medicare Other | Attending: Emergency Medicine | Admitting: Emergency Medicine

## 2020-08-21 DIAGNOSIS — Z79890 Hormone replacement therapy (postmenopausal): Secondary | ICD-10-CM | POA: Insufficient documentation

## 2020-08-21 DIAGNOSIS — W010XXA Fall on same level from slipping, tripping and stumbling without subsequent striking against object, initial encounter: Secondary | ICD-10-CM | POA: Diagnosis not present

## 2020-08-21 DIAGNOSIS — M545 Low back pain: Secondary | ICD-10-CM | POA: Insufficient documentation

## 2020-08-21 DIAGNOSIS — E039 Hypothyroidism, unspecified: Secondary | ICD-10-CM | POA: Insufficient documentation

## 2020-08-21 DIAGNOSIS — R519 Headache, unspecified: Secondary | ICD-10-CM | POA: Diagnosis not present

## 2020-08-21 DIAGNOSIS — W19XXXA Unspecified fall, initial encounter: Secondary | ICD-10-CM

## 2020-08-21 DIAGNOSIS — Z79899 Other long term (current) drug therapy: Secondary | ICD-10-CM | POA: Insufficient documentation

## 2020-08-21 DIAGNOSIS — Y92009 Unspecified place in unspecified non-institutional (private) residence as the place of occurrence of the external cause: Secondary | ICD-10-CM | POA: Diagnosis not present

## 2020-08-21 DIAGNOSIS — M549 Dorsalgia, unspecified: Secondary | ICD-10-CM | POA: Diagnosis present

## 2020-08-21 NOTE — Discharge Instructions (Signed)
Follow-up with your primary care provider. Return to the ER if you start to experience chest pain, shortness of breath, additional injuries or falls, headache or blurry vision.

## 2020-08-21 NOTE — ED Notes (Signed)
Pt resting and talking. Son is at bedside.

## 2020-08-21 NOTE — ED Provider Notes (Signed)
[facility] Provider Note   CSN: [id number] Arrival date & time: 08/21/20  [id number]     History Chief Complaint  Patient presents with  . Fall    Patient had an unwitnessed fall, no LOC, no blood thinners.    Tara Nolan is a 86 y.o. female with a past medical history of prior stroke presenting to the ED with a chief complaint of fall.  History is limited as patient is unsure why she is here.  When asked about pain she states that "honey, I hurt everywhere all the time. I just do what they tell me to do."  Denies chest pain, vomiting, back pain, neck pain.  She is asking if she could be helped to use the bathroom.  HPI     Past Medical History:  Diagnosis Date  . Stroke ([facility])   . Thyroid disease     Patient Active Problem List   Diagnosis Date Noted  . Adverse reaction to vaccine, initial encounter 01/30/2020  . Compression fracture of lumbar vertebra ([facility]) 01/30/2020  . Syncope and collapse 11/22/2018  . Back pain 11/22/2018  . Hypothyroid 11/22/2018  . History of CVA (cerebrovascular accident) 09/12/2017  . Fall 09/08/2017  . Right shoulder pain 09/08/2017  . Left wrist fracture 09/08/2017  . History of thyroid disease 09/08/2017  . Chest discomfort 09/08/2017  . Hip pain, acute 06/20/2012  . Knee pain 06/20/2012  . Hamstring tendon rupture 06/20/2012    Past Surgical History:  Procedure Laterality Date  . ABDOMINAL HYSTERECTOMY    . APPENDECTOMY    . HERNIA REPAIR    . IR KYPHO EA ADDL LEVEL THORACIC OR LUMBAR  02/03/2020  . IR KYPHO LUMBAR INC FX REDUCE BONE BX UNI/BIL CANNULATION INC/IMAGING  02/03/2020  . THYROIDECTOMY       OB History   No obstetric history on file.     No family history on file.  Social History   Tobacco Use  . Smoking status: Never Smoker  . Smokeless tobacco: Never Used  Substance Use Topics  . Alcohol use: No  . Drug use: No    Home Medications Prior to Admission medications     Medication Sig Start Date End Date Taking? Authorizing Provider  acetaminophen (TYLENOL) 325 MG tablet Take 1 tablet (325 mg total) by mouth every 6 (six) hours. Patient taking differently: Take 650 mg by mouth daily as needed. Two tabs qd prn 02/04/20   Alwyn RenMathews, Elizabeth G, MD  amLODipine (NORVASC) 5 MG tablet Take 1 tablet (5 mg total) by mouth daily. 04/16/20   Elpidio AnisUpstill, Shari, PA-C  carboxymethylcellulose 1 % ophthalmic solution 2 drops every 2 (two) hours as needed. As needed for dry eyes    [provider]  HM LIDOCAINE PATCH 4 % PTCH Apply topically. Apply to lower back in am and remove qhs    [provider]  levothyroxine (SYNTHROID) 50 MCG tablet Take 50 mcg by mouth daily before breakfast.    [provider]  polyethylene glycol (MIRALAX / GLYCOLAX) 17 g packet Take 17 g by mouth daily. 02/04/20   Alwyn RenMathews, Elizabeth G, MD  senna-docusate (SENOKOT-S) 8.6-50 MG tablet Take 1 tablet by mouth at bedtime as needed for mild constipation or moderate constipation. 02/04/20   Alwyn RenMathews, Elizabeth G, MD  traZODone (DESYREL) 50 MG tablet Take 50 mg by mouth at bedtime.    [provider]    Allergies    Other  Review of Systems  Review of Systems  Unable to perform ROS: Age  Cardiovascular: Negative for chest pain.  Gastrointestinal: Negative for abdominal pain.  Neurological: Negative for headaches.    Physical Exam Updated Vital Signs BP (!) 174/84   Pulse 87   Temp 97.7 F (36.5 C) (Oral)   Resp (!) 24   SpO2 100%   Physical Exam Vitals and nursing note reviewed.  Constitutional:      General: She is not in acute distress.    Appearance: She is well-developed.  HENT:     Head: Normocephalic and atraumatic.     Nose: Nose normal.  Eyes:     General: No scleral icterus.       Right eye: No discharge.        Left eye: No discharge.     Conjunctiva/sclera: Conjunctivae normal.     Pupils: Pupils are equal, round, and reactive to light.   Cardiovascular:     Rate and Rhythm: Normal rate and regular rhythm.     Heart sounds: Normal heart sounds. No murmur heard.  No friction rub. No gallop.   Pulmonary:     Effort: Pulmonary effort is normal. No respiratory distress.     Breath sounds: Normal breath sounds.  Abdominal:     General: Bowel sounds are normal. There is no distension.     Palpations: Abdomen is soft.     Tenderness: There is no abdominal tenderness. There is no guarding.  Musculoskeletal:        General: Normal range of motion.     Cervical back: Normal range of motion and neck supple.     Comments: No midline spinal tenderness present in lumbar, thoracic or cervical spine. No step-off palpated. No visible bruising, edema or temperature change noted. No objective signs of numbness present. No saddle anesthesia. 2+ DP pulses bilaterally. Sensation intact to light touch. Strength 5/5 in bilateral lower extremities.  Skin:    General: Skin is warm and dry.     Findings: No rash.  Neurological:     Mental Status: She is alert.     Cranial Nerves: No cranial nerve deficit.     Sensory: No sensory deficit.     Motor: No weakness or abnormal muscle tone.     Coordination: Coordination normal.     ED Results / Procedures / Treatments   Labs (all labs ordered are listed, but only abnormal results are displayed) Labs Reviewed - No data to display  EKG None  Radiology DG Chest 2 View  Result Date: 08/21/2020 CLINICAL DATA:  Fall. EXAM: CHEST - 2 VIEW COMPARISON:  04/15/2020 and prior exams FINDINGS: The cardiomediastinal silhouette is unchanged. Scarring within both UPPER lungs again noted. There is no evidence of focal airspace disease, pulmonary edema, suspicious pulmonary nodule/mass, pleural effusion, or pneumothorax. No acute bony abnormalities are identified. Vertebral augmentation changes of the lumbar spine noted. IMPRESSION: No active cardiopulmonary disease. Electronically Signed   By: Harmon Pier  M.D.   On: 08/21/2020 08:50   CT Head Wo Contrast  Result Date: 08/21/2020 CLINICAL DATA:  86 year old female with headache and neck pain following fall. Initial encounter. EXAM: CT HEAD WITHOUT CONTRAST CT CERVICAL SPINE WITHOUT CONTRAST TECHNIQUE: Multidetector CT imaging of the head and cervical spine was performed following the standard protocol without intravenous contrast. Multiplanar CT image reconstructions of the cervical spine were also generated. COMPARISON:  01/30/2020 head CT and prior exams FINDINGS: CT HEAD FINDINGS Brain: No evidence of acute infarction, hemorrhage, hydrocephalus,  extra-axial collection or mass lesion/mass effect. Atrophy and chronic small-vessel white matter ischemic changes are again noted. Vascular: Carotid atherosclerotic calcifications are noted. Skull: Normal. Negative for fracture or focal lesion. Sinuses/Orbits: No acute finding. Other: None. CT CERVICAL SPINE FINDINGS Alignment: No acute subluxation. 2 mm anterolisthesis of C3 on 4 and C7 on T1 are unchanged. Skull base and vertebrae: No acute fracture. No primary bone lesion or focal pathologic process. Soft tissues and spinal canal: No prevertebral fluid or swelling. No visible canal hematoma. Disc levels: Moderate degenerative disc disease from C4-C7 again noted. Upper chest: No acute abnormality. Biapical pleuroparenchymal scarring again noted. Other: None IMPRESSION: 1. No evidence of acute intracranial abnormality. Atrophy and chronic small-vessel white matter ischemic changes. 2. No static evidence of acute injury to the cervical spine. Electronically Signed   By: Harmon Pier M.D.   On: 08/21/2020 08:49   CT Cervical Spine Wo Contrast  Result Date: 08/21/2020 CLINICAL DATA:  86 year old female with headache and neck pain following fall. Initial encounter. EXAM: CT HEAD WITHOUT CONTRAST CT CERVICAL SPINE WITHOUT CONTRAST TECHNIQUE: Multidetector CT imaging of the head and cervical spine was performed following  the standard protocol without intravenous contrast. Multiplanar CT image reconstructions of the cervical spine were also generated. COMPARISON:  01/30/2020 head CT and prior exams FINDINGS: CT HEAD FINDINGS Brain: No evidence of acute infarction, hemorrhage, hydrocephalus, extra-axial collection or mass lesion/mass effect. Atrophy and chronic small-vessel white matter ischemic changes are again noted. Vascular: Carotid atherosclerotic calcifications are noted. Skull: Normal. Negative for fracture or focal lesion. Sinuses/Orbits: No acute finding. Other: None. CT CERVICAL SPINE FINDINGS Alignment: No acute subluxation. 2 mm anterolisthesis of C3 on 4 and C7 on T1 are unchanged. Skull base and vertebrae: No acute fracture. No primary bone lesion or focal pathologic process. Soft tissues and spinal canal: No prevertebral fluid or swelling. No visible canal hematoma. Disc levels: Moderate degenerative disc disease from C4-C7 again noted. Upper chest: No acute abnormality. Biapical pleuroparenchymal scarring again noted. Other: None IMPRESSION: 1. No evidence of acute intracranial abnormality. Atrophy and chronic small-vessel white matter ischemic changes. 2. No static evidence of acute injury to the cervical spine. Electronically Signed   By: Harmon Pier M.D.   On: 08/21/2020 08:49   DG Hip Unilat W or Wo Pelvis 2-3 Views Left  Result Date: 08/21/2020 CLINICAL DATA:  Acute LEFT hip pain following fall. EXAM: DG HIP (WITH OR WITHOUT PELVIS) 2-3V LEFT COMPARISON:  12/26/2019 radiograph FINDINGS: There is no evidence of hip fracture or dislocation. There is no evidence of arthropathy or other focal bone abnormality. IMPRESSION: Negative. Electronically Signed   By: Harmon Pier M.D.   On: 08/21/2020 08:51    Procedures Procedures (including critical care time)  Medications Ordered in ED Medications - No data to display  ED Course  I have reviewed the triage vital signs and the nursing notes.  Pertinent labs  & imaging results that were available during my care of the patient were reviewed by me and considered in my medical decision making (see chart for details).    MDM Rules/Calculators/A&P                          86 year old female presenting to the ED after unwitnessed fall at facility.  Patient denying any complaints other than wanting to use the bathroom.  She has no C, T or L-spine tenderness to palpation.  Moving all extremities without difficulty.  Alert and  oriented to her baseline.  Pupils equal and reactive.  CT of the head and cervical spine are negative for acute abnormalities.  Chest x-ray and hip/pelvis x-rays without any acute abnormalities.  Patient is hemodynamically stable here.  We will have her follow-up with PCP and return for worsening symptoms.  Patient is hemodynamically stable, in NAD. Evaluation does not show pathology that would require ongoing emergent intervention or inpatient treatment. I explained the diagnosis to the patient. Pain has been managed and has no complaints prior to discharge. Patient is comfortable with above plan and is stable for discharge at this time. All questions were answered prior to disposition. Strict return precautions for returning to the ED were discussed. Encouraged follow up with PCP.   An After Visit Summary was printed and given to the patient.   Portions of this note were generated with Scientist, clinical (histocompatibility and immunogenetics). Dictation errors may occur despite best attempts at proofreading.  Final Clinical Impression(s) / ED Diagnoses Final diagnoses:  Fall in home, initial encounter    Rx / DC Orders ED Discharge Orders    None       Dietrich Pates, [place] 08/21/20 4174    Tegeler, Canary Brim, MD 08/22/20 [phone]

## 2020-08-21 NOTE — ED Triage Notes (Signed)
Patient here by EMS from [facility] with c/o unwitnessed fall, patient found on the floor on her back.  Patient has dementia, c/o tenderness with low back pain and hips.  Denies loc, no blood thinners.  Patient in C-collar, 120palp,96,96%ra, cbg 98.

## 2020-08-21 NOTE — ED Notes (Signed)
Call received from pt son Daizee Firmin [phone] requesting rtn call for pt status updates. [employer]

## 2020-09-29 ENCOUNTER — Non-Acute Institutional Stay: Payer: Medicare Other | Admitting: Nurse Practitioner

## 2020-09-29 ENCOUNTER — Other Ambulatory Visit: Payer: Self-pay

## 2020-09-29 DIAGNOSIS — Z515 Encounter for palliative care: Secondary | ICD-10-CM

## 2020-09-29 DIAGNOSIS — E43 Unspecified severe protein-calorie malnutrition: Secondary | ICD-10-CM

## 2020-09-29 NOTE — Progress Notes (Signed)
[facility] Consult Note Telephone: [phone]  Fax: [phone]  PATIENT NAME: Tara Nolan [address] [place] [address] [phone] (home)  DOB: October 15, 1919 MRN: [id number]  PRIMARY CARE PROVIDER:    Chesley Noon, MD,  [place] [address] [phone]  REFERRING PROVIDER:   Roetta Sessions, [place], [place] [address] [phone]  RESPONSIBLE PARTY:   Extended Emergency Contact Information Primary Emergency Contact: Steege,Tom Address: [address]          [place], [place] [address] [place]nette Litter of [place] Phone: [phone] Mobile Phone: [phone] Relation: Son Secondary Emergency Contact: [place], [place] Phone: [phone] Relation: Daughter  I met face to face with patient in facility.  ASSESSMENT AND RECOMMENDATIONS:   1. Advance care planning: Goal of care: Goal of care is function and to be happy in new home and the Assisted Living facility. Directives: Patent's code status is DNR. Signed DNR and MOST form in the facility and on [facility] EMR.  2. Symptom Management:  Severe protein calorie malnutrition: BMI 15.2kg/m2. Patient on Mirtazapine 7.31m daily for appetite stimulation. Son report improvement in appetite, slight increase in weight noted. Recommendation: Continue current plan of care, consider addition of nutritional supplements like Ensure between meals to augment calorie intake from meals. Encourage snacking between meals. To maintain skin integrity and prevent skin tears, recommend moisturizing skin daily with lotion, wear long sleeves for protection, and maintaining adequate hydration.  3. Follow up Palliative Care Visit: Palliative care will continue to follow for goals of care clarification and symptom management. Return in about 6-8 weeks or prn.  4. 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 Son very involved in her care.  5. Cognitive / Functional decline: Patient awake and alert, confused, oriented to self. Requires assistance with bathing and dressing. Able to self transfer from her bed to wheelchair which she uses for ambulation. Feeds self. Patient with frequent falls.  I spent 48 minutes providing this consultation, time includes time spent with patient and on phone with family, chart review, and documentation. More than 50% of the time in this consultation was spent coordinating communication.   HISTORY OF PRESENT ILLNESS:  Tara SYLERis a 86y.o. female with h/o CVA, hypothyroidism, hx of urinary retention, hx of compression fracture s/p Kyphoplasty. Palliative care was asked to assist with advance care planning and symptoms management.  CODE STATUS: DNR  PPS: 40%  HOSPICE ELIGIBILITY/DIAGNOSIS: TBD  PHYSICAL EXAM / ROS:   Current and past weights: 85.6lbs up from 84.5lbs at last palliative care visit 6 months ago. Ht 525f", BMI 15.2kg/m2 General:  frail appearing, cachetic, lying in bed in NAD Cardiovascular:  no edema. S1S2 normal Pulmonary: no cough, no SOB, room air GI: No report of swallowing issues, appetite fair, denied constipation, continent of bowel GU: denies dysuria, incontinent of urine MSK:  no joint and ROM abnormalities, ambulatory Skin: no rashes or wounds reported, skin thin and fragile, wound dressing to left fore arm Neurological: Weakness  PAST MEDICAL HISTORY:  Past Medical History:  Diagnosis Date  . [place]  . Thyroid disease     SOCIAL HX:  Social History   Tobacco Use  . Smoking status: Never Smoker  . Smokeless tobacco: Never Used  Substance Use  Topics  . Alcohol use: No   FAMILY HX: No family history on file.  ALLERGIES:  Allergies  Allergen Reactions  . Other Other (See Comments)     All medications make me bonkers      PERTINENT MEDICATIONS:  Outpatient Encounter Medications as of 09/29/2020  Medication Sig  . acetaminophen (TYLENOL) 325 MG tablet Take 1 tablet (325 mg total) by mouth every 6 (six) hours. (Patient taking differently: Take 650 mg by mouth daily as needed. Two tabs qd prn)  . amLODipine (NORVASC) 5 MG tablet Take 1 tablet (5 mg total) by mouth daily.  . carboxymethylcellulose 1 % ophthalmic solution 2 drops every 2 (two) hours as needed. As needed for dry eyes  . HM LIDOCAINE PATCH 4 % PTCH Apply topically. Apply to lower back in am and remove qhs  . levothyroxine (SYNTHROID) 50 MCG tablet Take 50 mcg by mouth daily before breakfast.  . polyethylene glycol (MIRALAX / GLYCOLAX) 17 g packet Take 17 g by mouth daily.  Marland Kitchen senna-docusate (SENOKOT-S) 8.6-50 MG tablet Take 1 tablet by mouth at bedtime as needed for mild constipation or moderate constipation.  . traZODone (DESYREL) 50 MG tablet Take 50 mg by mouth at bedtime.   No facility-administered encounter medications on file as of 09/29/2020.    Jari Favre , DNP, AGPCNP-BC

## 2020-12-08 ENCOUNTER — Other Ambulatory Visit: Payer: Self-pay

## 2020-12-08 ENCOUNTER — Non-Acute Institutional Stay: Payer: Medicare Other | Admitting: Nurse Practitioner

## 2020-12-08 DIAGNOSIS — R634 Abnormal weight loss: Secondary | ICD-10-CM

## 2020-12-08 DIAGNOSIS — Z515 Encounter for palliative care: Secondary | ICD-10-CM

## 2020-12-08 NOTE — Progress Notes (Signed)
[facility] Consult Note Telephone: [phone]  Fax: [phone]  PATIENT NAME: Tara Nolan [address] [place] [address] [phone] (home)  DOB: [birth year] MRN: [id number]  PRIMARY CARE PROVIDER:    Clide Deutscher, NP  REFERRING PROVIDER:   Clide Deutscher, NP  RESPONSIBLE PARTY:   Extended Emergency Contact Information Primary Emergency Contact: Tara Nolan Address: [address]          [place], [place] [address] [place] Phone: [phone] Mobile Phone: [phone] Relation: Son Secondary Emergency Contact: Tara Nolan Mobile Phone: [phone] Relation: Daughter  I met face to face with patient in facility.   ASSESSMENT AND RECOMMENDATIONS:   Advance Care Planning: Patient's goal of care is comfort. Signed DNR and MOST form on file in the facility and on [facility] EMR.  Symptom Management:  Patient with severe protein calorie malnutrition related to ongoing weight loss. Weight this month is 73.8lbs down from 85.6lbs at last Palliative care visit 2 months ago, current BMI 13.1kg/m2. Staff report ongoing poor oral intake related to poor appetite and advance dementia, report less than 50% meal intake, patient on Remeron 7.20m QHS.  Son report patient not compliant with taking nutritional supplement (Ensure) brought in by son. Staff report patient stays in bed all day refuses to ambulate. No report of fever, chills, or SOB. No report of recent falls. Patient denied pain, pain controlled on Tramadol 536mtwice a day and Tylenol 65087my mouth daily. Recommendation: recommend patient be fed during meals. Spoke with facility Resident care coordinator Tara Nolan said the services will incur a cost to patient. Patient son made aware, also discussed patient's current condition and what it means in the larger context of patient's on-going weight loss which may not sustain  life for long. Hospice services and philosophy was discussed and explained in detail. Son Tara Musselpressed interest in Hospice care services, stating his goal of care for patient at this time is comfort.  Follow up Palliative Care Visit: Palliative care will continue to follow for goals of care clarification and symptom management. Return as needed.  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  Cognitive / Functional decline: Patient awake, alert, and confused, oriented to self. Requires assistance with bathing and dressing. One person assist with transfer from bed to wheelchair. Wheelchair dependent for ambulation. Feeds self, requires reminders.  I spent 60 minutes providing this consultation, time includes time spent with patient and on phone with son, chart review, provider coordination, and documentation. More than 50% of the time in this consultation was spent counseling and coordinating communication.   CHIEF COMPLAINT: weight loss  History obtained from review of EMR, discussion with facility staff, interview with family. Records reviewed and summarized bellow.  HISTORY OF PRESENT ILLNESS:Tara S Greningeris a [age]o.femalewith h/o CVA, Dementia (FAST 7a), hypothyroidism, hx of urinary retention, hx of compression fracture s/p Kyphoplasty. Palliative care was asked to assist with advance care planning and symptoms management. This is a follow up visit.  CODE STATUS: DNR  PPS: 30%  HOSPICE ELIGIBILITY/DIAGNOSIS: yes/dementia. Patient deemed eligible for Hospice services. Son agreed to services. Verbal order received from her PCP CouClide DeutscherP. She agreed to be her attending.  PHYSICAL EXAM / ROS:   Current and past weights: 73.8lbs down from 85.6lbs two months ago, BMI 13.1kg/m2 General:  frail appearing,  cachetic, lying in bed in NAD Cardiovascular:  no edema.  S1S2 normal Pulmonary: no cough, no SOB, room air GI: No report of swallowing issues, appetite fair, denied constipation,incontinent of bowel GU: denies dysuria, incontinent of urine MSK:  no joint and ROM abnormalities, ambulatory Skin: no rashes or wounds reported, skin thin and fragile, healing skin tear noted to left lower leg Neurological: Weakness, confused Psych: non -anxious affect  PAST MEDICAL HISTORY:  Past Medical History:  Diagnosis Date  . Stroke ([facility])   . Thyroid disease     SOCIAL HX:  Social History   Tobacco Use  . Smoking status: Never Smoker  . Smokeless tobacco: Never Used  Substance Use Topics  . Alcohol use: No   FAMILY HX: No family history on file.  ALLERGIES:  Allergies  Allergen Reactions  . Other Other (See Comments)    All medications make me bonkers      PERTINENT MEDICATIONS:  Outpatient Encounter Medications as of 12/08/2020  Medication Sig  . acetaminophen (TYLENOL) 325 MG tablet Take 1 tablet (325 mg total) by mouth every 6 (six) hours. (Patient taking differently: Take 650 mg by mouth daily as needed. Two tabs qd prn)  . amLODipine (NORVASC) 5 MG tablet Take 1 tablet (5 mg total) by mouth daily.  . carboxymethylcellulose 1 % ophthalmic solution 2 drops every 2 (two) hours as needed. As needed for dry eyes  . HM LIDOCAINE PATCH 4 % PTCH Apply topically. Apply to lower back in am and remove qhs  . levothyroxine (SYNTHROID) 50 MCG tablet Take 50 mcg by mouth daily before breakfast.  . polyethylene glycol (MIRALAX / GLYCOLAX) 17 g packet Take 17 g by mouth daily.  Marland Kitchen senna-docusate (SENOKOT-S) 8.6-50 MG tablet Take 1 tablet by mouth at bedtime as needed for mild constipation or moderate constipation.  . traZODone (DESYREL) 50 MG tablet Take 50 mg by mouth at bedtime.   No facility-administered encounter medications on file as of 12/08/2020.    Thank you for the opportunity to participate in the care of Tara Nolan. The palliative  care team will continue to follow. Please call our office at [phone] if we can be of additional assistance.  Tara Favre, DNP, AGPCNP-BC

## 2021-05-12 IMAGING — CT CT HEAD W/O CM
3 series · 16 of 47 positions shown, 19 images · non-contrast
Comparison: 12/26/2019

CLINICAL DATA: Transient ischemic attack.

EXAM:
CT HEAD WITHOUT CONTRAST
TECHNIQUE: Contiguous axial images were obtained from the base of the skull
through the vertex without intravenous contrast.

[Series 3: head wo · axial · 0.44mm/px · z∈[-131,-6]mm · 10 of 31 slices shown, 13 images]
[im 3/31  brain]
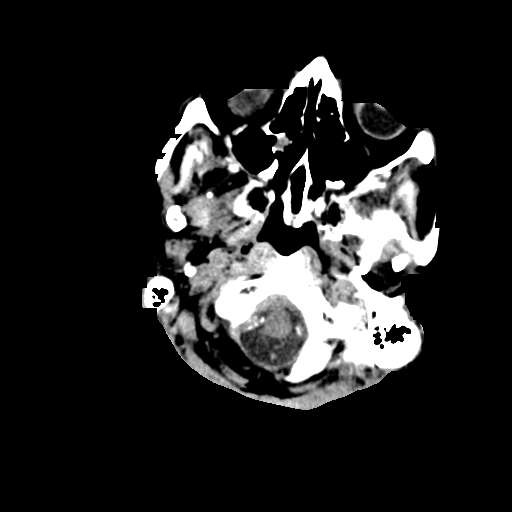
[im 3/31  bone]
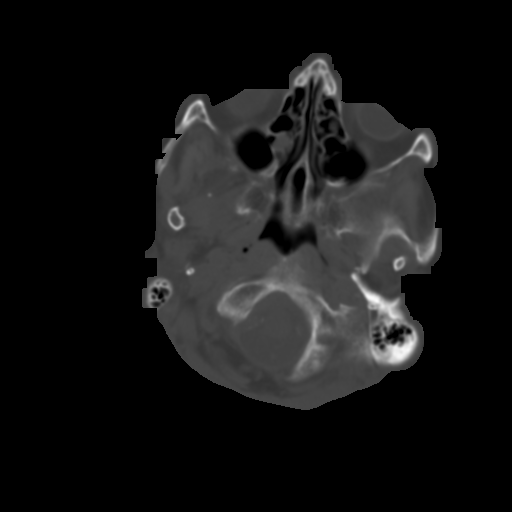
[im 6/31  brain]
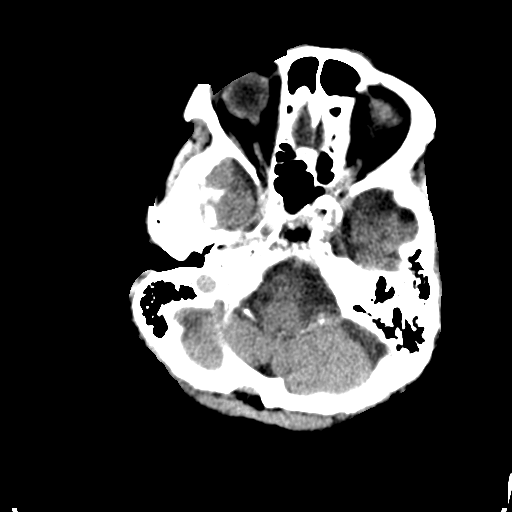
[im 9/31  brain]
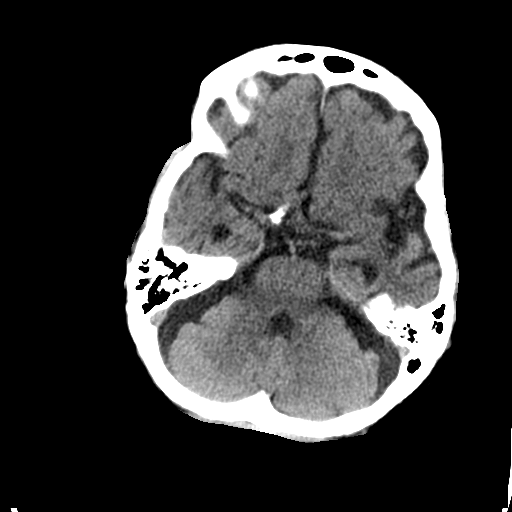
[im 11/31  brain]
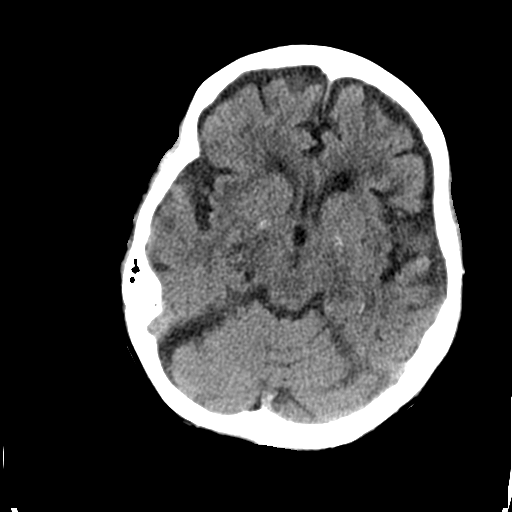
[im 14/31  brain]
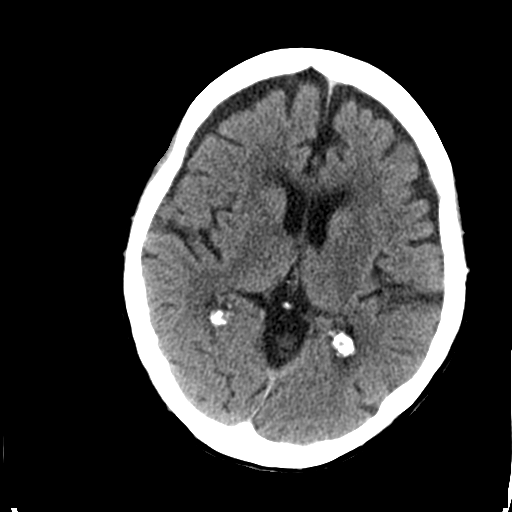
[im 14/31  bone]
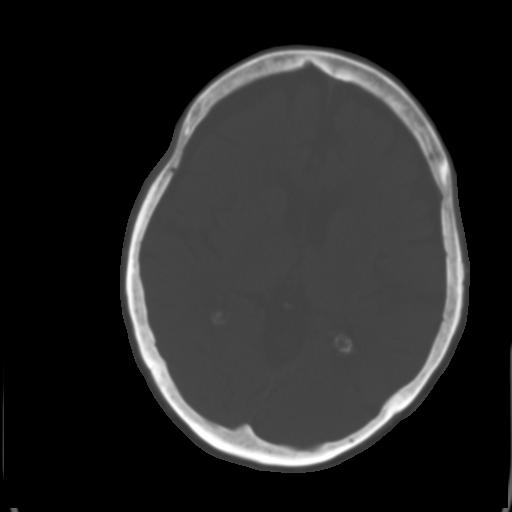
[im 17/31  brain]
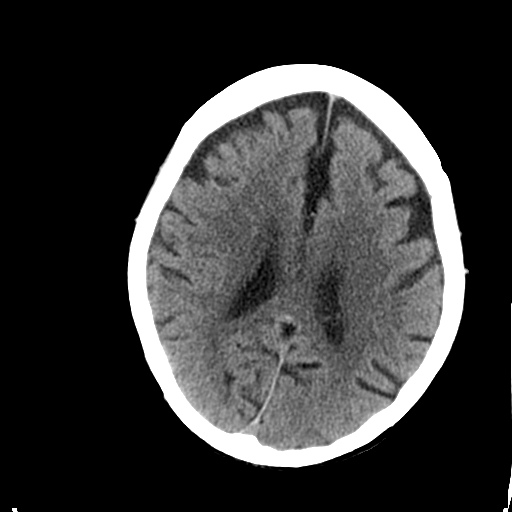
[im 20/31  brain]
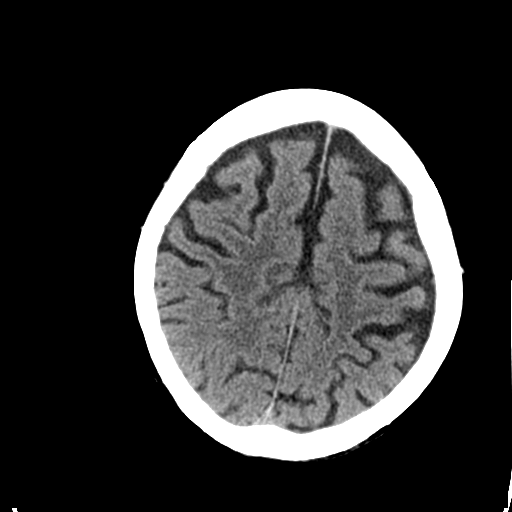
[im 23/31  brain]
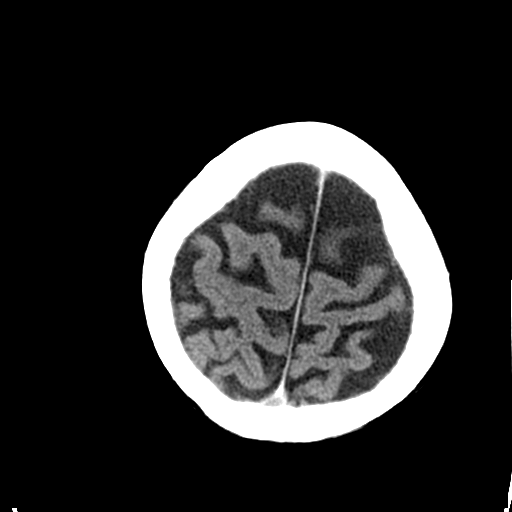
[im 25/31  brain]
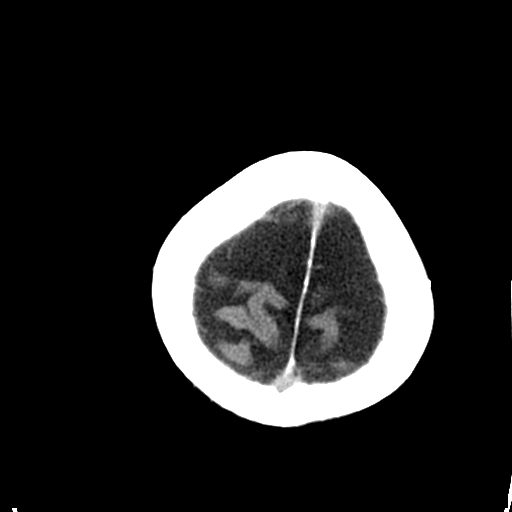
[im 25/31  bone]
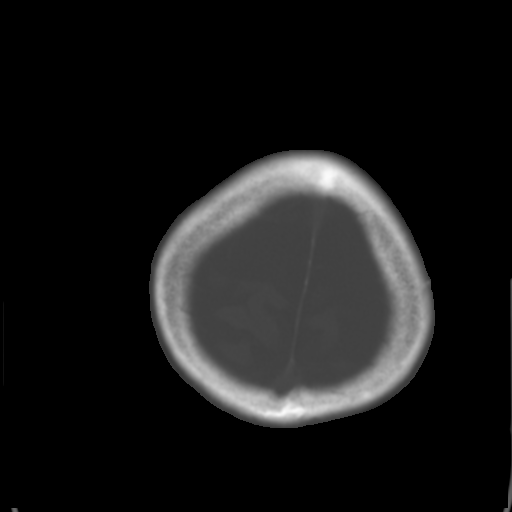
[im 28/31  brain]
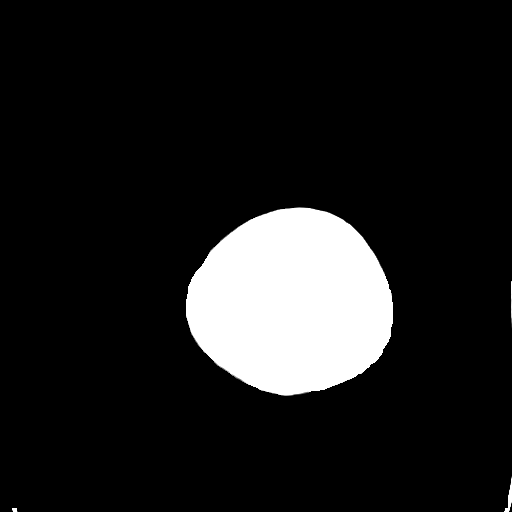

[Series 5: coronal soft tissue · coronal · 0.32mm/px · 3 of 69 slices shown]
[im 23/69  brain]
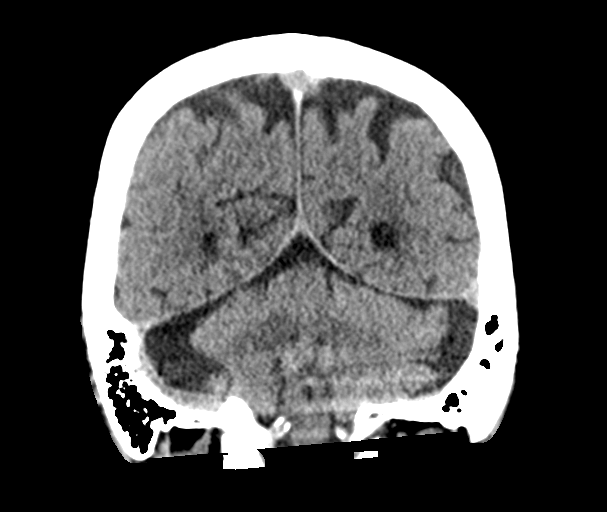
[im 31/69  brain]
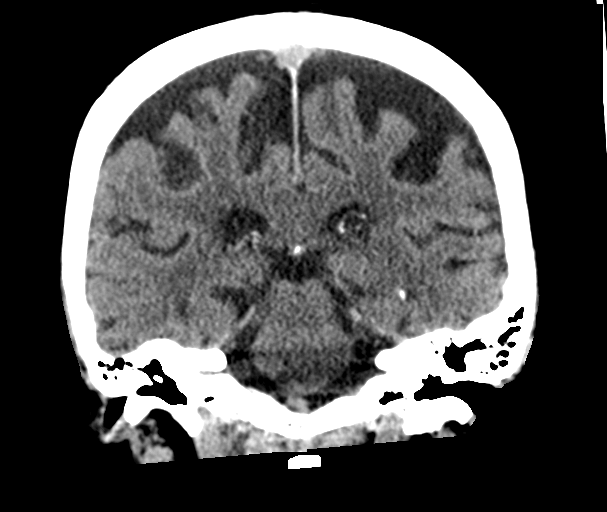
[im 38/69  brain]
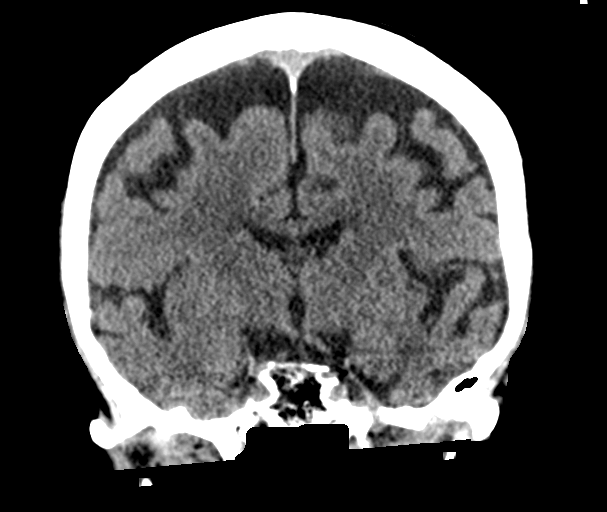

[Series 6: sagittal soft tissue · sagittal · 0.32mm/px · 3 of 56 slices shown]
[im 19/56  brain]
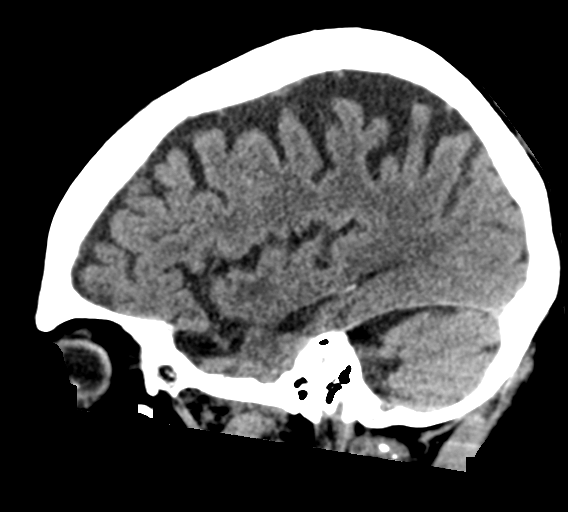
[im 28/56  brain]
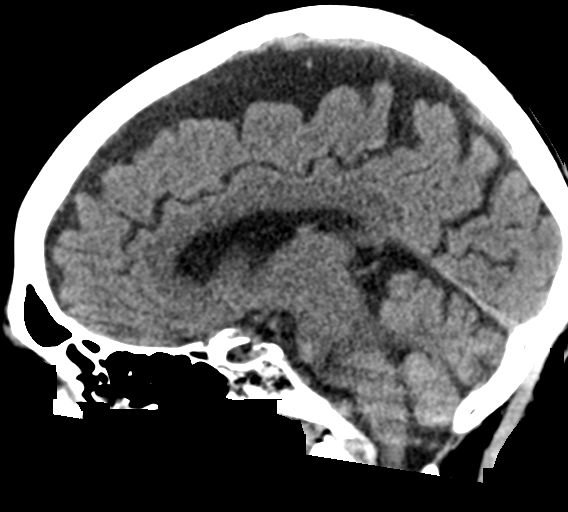
[im 37/56  brain]
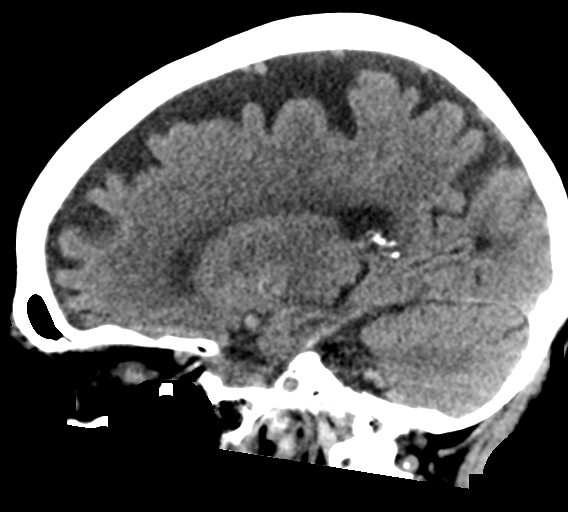

[16 of 47 positions shown; findings below may reference images not displayed]

FINDINGS: Brain: Stable minimally enlarged ventricles and moderately enlarged
subarachnoid spaces involving the cerebral hemispheres and posterior
fossa. Stable mild patchy white matter low density in both cerebral
hemispheres. No intracranial hemorrhage, mass lesion or CT evidence
of acute infarction.

Vascular: No hyperdense vessel or unexpected calcification.

Skull: Normal. Negative for fracture or focal lesion.

Sinuses/Orbits: Stable single opacified right ethmoid air cell.
Unremarkable orbits.

Other: Bilateral temporomandibular joint degenerative changes.
IMPRESSION: 1. No acute abnormality.
2. Stable atrophy and chronic small vessel white matter ischemic
changes.
3. Bilateral TMJ degenerative changes.

## 2021-08-03 ENCOUNTER — Inpatient Hospital Stay (HOSPITAL_COMMUNITY): Admitting: Anesthesiology

## 2021-08-03 ENCOUNTER — Inpatient Hospital Stay (HOSPITAL_COMMUNITY)

## 2021-08-03 ENCOUNTER — Encounter (HOSPITAL_COMMUNITY): Payer: Self-pay

## 2021-08-03 ENCOUNTER — Other Ambulatory Visit: Payer: Self-pay

## 2021-08-03 ENCOUNTER — Encounter (HOSPITAL_COMMUNITY)
Admission: EM | Disposition: A | Discharge status: Skilled Nursing Facility | Payer: Self-pay | Source: Skilled Nursing Facility | Attending: Internal Medicine

## 2021-08-03 ENCOUNTER — Emergency Department (HOSPITAL_COMMUNITY)

## 2021-08-03 ENCOUNTER — Inpatient Hospital Stay (HOSPITAL_COMMUNITY)
Admission: EM | Admit: 2021-08-03 | Discharge: 2021-08-07 | DRG: 481 | Disposition: A | Discharge status: Skilled Nursing Facility | Source: Skilled Nursing Facility | Attending: Internal Medicine | Admitting: Internal Medicine

## 2021-08-03 DIAGNOSIS — Z20822 Contact with and (suspected) exposure to covid-19: Secondary | ICD-10-CM | POA: Diagnosis present

## 2021-08-03 DIAGNOSIS — F0280 Dementia in other diseases classified elsewhere without behavioral disturbance: Secondary | ICD-10-CM | POA: Diagnosis present

## 2021-08-03 DIAGNOSIS — Z66 Do not resuscitate: Secondary | ICD-10-CM | POA: Diagnosis present

## 2021-08-03 DIAGNOSIS — R339 Retention of urine, unspecified: Secondary | ICD-10-CM | POA: Diagnosis present

## 2021-08-03 DIAGNOSIS — S72142A Displaced intertrochanteric fracture of left femur, initial encounter for closed fracture: Secondary | ICD-10-CM | POA: Diagnosis present

## 2021-08-03 DIAGNOSIS — Z8673 Personal history of transient ischemic attack (TIA), and cerebral infarction without residual deficits: Secondary | ICD-10-CM

## 2021-08-03 DIAGNOSIS — M5136 Other intervertebral disc degeneration, lumbar region: Secondary | ICD-10-CM | POA: Diagnosis present

## 2021-08-03 DIAGNOSIS — Z79890 Hormone replacement therapy (postmenopausal): Secondary | ICD-10-CM

## 2021-08-03 DIAGNOSIS — Z8616 Personal history of COVID-19: Secondary | ICD-10-CM

## 2021-08-03 DIAGNOSIS — I10 Essential (primary) hypertension: Secondary | ICD-10-CM | POA: Diagnosis present

## 2021-08-03 DIAGNOSIS — W1830XA Fall on same level, unspecified, initial encounter: Secondary | ICD-10-CM | POA: Diagnosis present

## 2021-08-03 DIAGNOSIS — Z8249 Family history of ischemic heart disease and other diseases of the circulatory system: Secondary | ICD-10-CM | POA: Diagnosis not present

## 2021-08-03 DIAGNOSIS — M51369 Other intervertebral disc degeneration, lumbar region without mention of lumbar back pain or lower extremity pain: Secondary | ICD-10-CM | POA: Diagnosis present

## 2021-08-03 DIAGNOSIS — D62 Acute posthemorrhagic anemia: Secondary | ICD-10-CM | POA: Diagnosis not present

## 2021-08-03 DIAGNOSIS — Y92129 Unspecified place in nursing home as the place of occurrence of the external cause: Secondary | ICD-10-CM

## 2021-08-03 DIAGNOSIS — Z79899 Other long term (current) drug therapy: Secondary | ICD-10-CM | POA: Diagnosis not present

## 2021-08-03 DIAGNOSIS — G301 Alzheimer's disease with late onset: Secondary | ICD-10-CM | POA: Diagnosis present

## 2021-08-03 DIAGNOSIS — E890 Postprocedural hypothyroidism: Secondary | ICD-10-CM | POA: Diagnosis present

## 2021-08-03 DIAGNOSIS — W19XXXA Unspecified fall, initial encounter: Secondary | ICD-10-CM | POA: Diagnosis present

## 2021-08-03 DIAGNOSIS — S7290XA Unspecified fracture of unspecified femur, initial encounter for closed fracture: Secondary | ICD-10-CM

## 2021-08-03 DIAGNOSIS — E039 Hypothyroidism, unspecified: Secondary | ICD-10-CM | POA: Diagnosis present

## 2021-08-03 HISTORY — DX: Other intervertebral disc degeneration, lumbar region without mention of lumbar back pain or lower extremity pain: M51.369

## 2021-08-03 HISTORY — DX: Abnormal weight loss: R63.4

## 2021-08-03 HISTORY — PX: FEMUR IM NAIL: SHX1597

## 2021-08-03 HISTORY — DX: Unspecified dementia, unspecified severity, without behavioral disturbance, psychotic disturbance, mood disturbance, and anxiety: F03.90

## 2021-08-03 HISTORY — DX: Other intervertebral disc degeneration, lumbar region: M51.36

## 2021-08-03 HISTORY — DX: Dysphagia, unspecified: R13.10

## 2021-08-03 HISTORY — DX: Unspecified protein-calorie malnutrition: E46

## 2021-08-03 HISTORY — DX: Hypothyroidism, unspecified: E03.9

## 2021-08-03 LAB — CBC WITH DIFFERENTIAL/PLATELET
Abs Immature Granulocytes: 0.06 10*3/uL (ref 0.00–0.07)
Basophils Absolute: 0 10*3/uL (ref 0.0–0.1)
Basophils Relative: 1 %
Eosinophils Absolute: 0.1 10*3/uL (ref 0.0–0.5)
Eosinophils Relative: 1 %
HCT: 33.2 % — ABNORMAL LOW (ref 36.0–46.0)
Hemoglobin: 11 g/dL — ABNORMAL LOW (ref 12.0–15.0)
Immature Granulocytes: 1 %
Lymphocytes Relative: 13 %
Lymphs Abs: 1.1 10*3/uL (ref 0.7–4.0)
MCH: 32.1 pg (ref 26.0–34.0)
MCHC: 33.1 g/dL (ref 30.0–36.0)
MCV: 96.8 fL (ref 80.0–100.0)
Monocytes Absolute: 0.6 10*3/uL (ref 0.1–1.0)
Monocytes Relative: 7 %
Neutro Abs: 6.6 10*3/uL (ref 1.7–7.7)
Neutrophils Relative %: 77 %
Platelets: 284 10*3/uL (ref 150–400)
RBC: 3.43 MIL/uL — ABNORMAL LOW (ref 3.87–5.11)
RDW: 17.7 % — ABNORMAL HIGH (ref 11.5–15.5)
WBC: 8.5 10*3/uL (ref 4.0–10.5)
nRBC: 0 % (ref 0.0–0.2)

## 2021-08-03 LAB — PROTIME-INR
INR: 1.2 (ref 0.8–1.2)
Prothrombin Time: 15.1 seconds (ref 11.4–15.2)

## 2021-08-03 LAB — COMPREHENSIVE METABOLIC PANEL
ALT: 14 U/L (ref 0–44)
AST: 22 U/L (ref 15–41)
Albumin: 3.7 g/dL (ref 3.5–5.0)
Alkaline Phosphatase: 64 U/L (ref 38–126)
Anion gap: 8 (ref 5–15)
BUN: 14 mg/dL (ref 8–23)
CO2: 24 mmol/L (ref 22–32)
Calcium: 8.6 mg/dL — ABNORMAL LOW (ref 8.9–10.3)
Chloride: 106 mmol/L (ref 98–111)
Creatinine, Ser: 0.66 mg/dL (ref 0.44–1.00)
GFR, Estimated: >60 mL/min (ref >60)
Glucose, Bld: 109 mg/dL — ABNORMAL HIGH (ref 70–99)
Potassium: 3.5 mmol/L (ref 3.5–5.1)
Sodium: 138 mmol/L (ref 135–145)
Total Bilirubin: 0.5 mg/dL (ref 0.3–1.2)
Total Protein: 7 g/dL (ref 6.5–8.1)

## 2021-08-03 LAB — RESP PANEL BY RT-PCR (FLU A&B, COVID) ARPGX2
Influenza A by PCR: NEGATIVE
Influenza B by PCR: NEGATIVE
SARS Coronavirus 2 by RT PCR: POSITIVE — AB

## 2021-08-03 SURGERY — INSERTION, INTRAMEDULLARY ROD, FEMUR
Anesthesia: General

## 2021-08-03 MED ORDER — PROMETHAZINE HCL 25 MG/ML IJ SOLN: 6.2500 mg | INTRAMUSCULAR | Status: DC | PRN

## 2021-08-03 MED ORDER — CEFAZOLIN SODIUM-DEXTROSE 2-4 GM/100ML-% IV SOLN
2.0000 g | Freq: Four times a day (QID) | INTRAVENOUS | Status: AC
  Administered 2021-08-03 – 2021-08-04 (×2): 2 g via INTRAVENOUS
  Filled 2021-08-03 (×2): qty 100

## 2021-08-03 MED ORDER — ONDANSETRON HCL 4 MG PO TABS: 4.0000 mg | ORAL_TABLET | Freq: Four times a day (QID) | ORAL | Status: DC | PRN

## 2021-08-03 MED ORDER — EPHEDRINE 5 MG/ML INJ
INTRAVENOUS | Status: AC
  Filled 2021-08-03: qty 5

## 2021-08-03 MED ORDER — LORAZEPAM 0.5 MG PO TABS: 0.2500 mg | ORAL_TABLET | Freq: Four times a day (QID) | ORAL | Status: DC | PRN

## 2021-08-03 MED ORDER — HYDROCODONE-ACETAMINOPHEN 5-325 MG PO TABS
1.0000 | ORAL_TABLET | ORAL | Status: DC | PRN
  Administered 2021-08-04 – 2021-08-05 (×3): 1 via ORAL
  Filled 2021-08-03 (×3): qty 1

## 2021-08-03 MED ORDER — ASPIRIN EC 325 MG PO TBEC
325.0000 mg | DELAYED_RELEASE_TABLET | Freq: Every day | ORAL | Status: DC
  Administered 2021-08-04 – 2021-08-07 (×4): 325 mg via ORAL
  Filled 2021-08-03 (×4): qty 1

## 2021-08-03 MED ORDER — MIRTAZAPINE 15 MG PO TABS
7.5000 mg | ORAL_TABLET | Freq: Every day | ORAL | Status: DC
  Administered 2021-08-04 – 2021-08-06 (×3): 7.5 mg via ORAL
  Filled 2021-08-03 (×3): qty 1

## 2021-08-03 MED ORDER — OXYCODONE HCL 5 MG/5ML PO SOLN: 5.0000 mg | Freq: Once | ORAL | Status: DC | PRN

## 2021-08-03 MED ORDER — AMISULPRIDE (ANTIEMETIC) 5 MG/2ML IV SOLN: 10.0000 mg | Freq: Once | INTRAVENOUS | Status: DC | PRN

## 2021-08-03 MED ORDER — ACETAMINOPHEN 325 MG PO TABS
650.0000 mg | ORAL_TABLET | Freq: Four times a day (QID) | ORAL | Status: DC | PRN
  Administered 2021-08-07: 650 mg via ORAL
  Filled 2021-08-03: qty 2

## 2021-08-03 MED ORDER — CHLORHEXIDINE GLUCONATE 4 % EX LIQD: 60.0000 mL | Freq: Once | CUTANEOUS | Status: DC

## 2021-08-03 MED ORDER — PSYLLIUM 95 % PO PACK
1.0000 | PACK | Freq: Every day | ORAL | Status: DC
  Administered 2021-08-04 – 2021-08-07 (×3): 1 via ORAL
  Filled 2021-08-03 (×5): qty 1

## 2021-08-03 MED ORDER — DEXAMETHASONE SODIUM PHOSPHATE 10 MG/ML IJ SOLN
INTRAMUSCULAR | Status: AC
  Filled 2021-08-03: qty 1

## 2021-08-03 MED ORDER — MORPHINE SULFATE (PF) 2 MG/ML IV SOLN
2.0000 mg | INTRAVENOUS | Status: DC | PRN
Start: 2021-08-03 — End: 2021-08-03

## 2021-08-03 MED ORDER — BISACODYL 5 MG PO TBEC
5.0000 mg | DELAYED_RELEASE_TABLET | Freq: Every day | ORAL | Status: DC | PRN
  Administered 2021-08-06: 5 mg via ORAL
  Filled 2021-08-03: qty 1

## 2021-08-03 MED ORDER — ROCURONIUM BROMIDE 10 MG/ML (PF) SYRINGE
PREFILLED_SYRINGE | INTRAVENOUS | Status: DC | PRN
Start: 2021-08-03 — End: 2021-08-03
  Administered 2021-08-03: 50 mg via INTRAVENOUS

## 2021-08-03 MED ORDER — ALBUMIN HUMAN 5 % IV SOLN
INTRAVENOUS | Status: AC
  Filled 2021-08-03: qty 250

## 2021-08-03 MED ORDER — TRAMADOL HCL 50 MG PO TABS
50.0000 mg | ORAL_TABLET | Freq: Two times a day (BID) | ORAL | Status: DC
  Administered 2021-08-04 – 2021-08-06 (×5): 50 mg via ORAL
  Filled 2021-08-03 (×6): qty 1

## 2021-08-03 MED ORDER — METOPROLOL TARTRATE 5 MG/5ML IV SOLN: 5.0000 mg | Freq: Four times a day (QID) | INTRAVENOUS | Status: DC | PRN

## 2021-08-03 MED ORDER — PROPOFOL 10 MG/ML IV BOLUS
INTRAVENOUS | Status: DC | PRN
  Administered 2021-08-03: 80 mg via INTRAVENOUS

## 2021-08-03 MED ORDER — OXYCODONE HCL 5 MG PO TABS: 5.0000 mg | ORAL_TABLET | Freq: Once | ORAL | Status: DC | PRN

## 2021-08-03 MED ORDER — HYDROMORPHONE HCL 1 MG/ML IJ SOLN
0.2500 mg | INTRAMUSCULAR | Status: DC | PRN
  Administered 2021-08-03: 0.5 mg via INTRAVENOUS
  Administered 2021-08-03 (×2): 0.25 mg via INTRAVENOUS

## 2021-08-03 MED ORDER — HYDROMORPHONE HCL 1 MG/ML IJ SOLN
INTRAMUSCULAR | Status: AC
  Administered 2021-08-03: 0.25 mg via INTRAVENOUS
  Filled 2021-08-03: qty 1

## 2021-08-03 MED ORDER — PHENOL 1.4 % MT LIQD: 1.0000 | OROMUCOSAL | Status: DC | PRN

## 2021-08-03 MED ORDER — OXYCODONE HCL 5 MG PO TABS
ORAL_TABLET | ORAL | Status: AC
  Filled 2021-08-03: qty 1

## 2021-08-03 MED ORDER — ACETAMINOPHEN 650 MG RE SUPP: 650.0000 mg | Freq: Four times a day (QID) | RECTAL | Status: DC | PRN

## 2021-08-03 MED ORDER — LEVOTHYROXINE SODIUM 50 MCG PO TABS
50.0000 ug | ORAL_TABLET | Freq: Every day | ORAL | Status: DC
  Administered 2021-08-04 – 2021-08-07 (×4): 50 ug via ORAL
  Filled 2021-08-03 (×4): qty 1

## 2021-08-03 MED ORDER — PROMETHAZINE HCL 25 MG/ML IJ SOLN
INTRAMUSCULAR | Status: AC
  Filled 2021-08-03: qty 1

## 2021-08-03 MED ORDER — TRANEXAMIC ACID-NACL 1000-0.7 MG/100ML-% IV SOLN
INTRAVENOUS | Status: AC
  Filled 2021-08-03: qty 100

## 2021-08-03 MED ORDER — SUGAMMADEX SODIUM 200 MG/2ML IV SOLN
INTRAVENOUS | Status: DC | PRN
Start: 2021-08-03 — End: 2021-08-03
  Administered 2021-08-03: 200 mg via INTRAVENOUS

## 2021-08-03 MED ORDER — ONDANSETRON HCL 4 MG/2ML IJ SOLN: 4.0000 mg | Freq: Four times a day (QID) | INTRAMUSCULAR | Status: DC | PRN

## 2021-08-03 MED ORDER — PHENYLEPHRINE 40 MCG/ML (10ML) SYRINGE FOR IV PUSH (FOR BLOOD PRESSURE SUPPORT)
PREFILLED_SYRINGE | INTRAVENOUS | Status: AC
  Filled 2021-08-03: qty 10

## 2021-08-03 MED ORDER — MENTHOL 3 MG MT LOZG: 1.0000 | LOZENGE | OROMUCOSAL | Status: DC | PRN

## 2021-08-03 MED ORDER — NALOXONE HCL 0.4 MG/ML IJ SOLN: 0.4000 mg | INTRAMUSCULAR | Status: DC | PRN

## 2021-08-03 MED ORDER — CEFAZOLIN SODIUM-DEXTROSE 2-4 GM/100ML-% IV SOLN
2.0000 g | INTRAVENOUS | Status: AC
  Administered 2021-08-03: 2 g via INTRAVENOUS
  Filled 2021-08-03: qty 100

## 2021-08-03 MED ORDER — ALBUMIN HUMAN 5 % IV SOLN: INTRAVENOUS | Status: DC | PRN

## 2021-08-03 MED ORDER — PHENYLEPHRINE HCL-NACL 20-0.9 MG/250ML-% IV SOLN
INTRAVENOUS | Status: AC
  Filled 2021-08-03: qty 250

## 2021-08-03 MED ORDER — FENTANYL CITRATE PF 50 MCG/ML IJ SOSY
100.0000 ug | PREFILLED_SYRINGE | Freq: Once | INTRAMUSCULAR | Status: AC
  Administered 2021-08-03: 100 ug via INTRAVENOUS
  Filled 2021-08-03: qty 2

## 2021-08-03 MED ORDER — MORPHINE SULFATE (PF) 2 MG/ML IV SOLN: 2.0000 mg | INTRAVENOUS | Status: DC | PRN

## 2021-08-03 MED ORDER — MORPHINE SULFATE (PF) 2 MG/ML IV SOLN
2.0000 mg | Freq: Once | INTRAVENOUS | Status: AC
  Administered 2021-08-03: 2 mg via INTRAVENOUS
  Filled 2021-08-03: qty 1

## 2021-08-03 MED ORDER — CHLORHEXIDINE GLUCONATE 0.12 % MT SOLN: 15.0000 mL | Freq: Once | OROMUCOSAL | Status: DC

## 2021-08-03 MED ORDER — DOCUSATE SODIUM 100 MG PO CAPS
100.0000 mg | ORAL_CAPSULE | Freq: Two times a day (BID) | ORAL | Status: DC
  Administered 2021-08-04 – 2021-08-05 (×2): 100 mg via ORAL
  Filled 2021-08-03 (×5): qty 1

## 2021-08-03 MED ORDER — PHENYLEPHRINE HCL-NACL 20-0.9 MG/250ML-% IV SOLN
INTRAVENOUS | Status: DC | PRN
  Administered 2021-08-03: 50 ug/min via INTRAVENOUS

## 2021-08-03 MED ORDER — POVIDONE-IODINE 10 % EX SWAB: 2.0000 "application " | Freq: Once | CUTANEOUS | Status: DC

## 2021-08-03 MED ORDER — TRAZODONE HCL 50 MG PO TABS
50.0000 mg | ORAL_TABLET | Freq: Every day | ORAL | Status: DC
  Administered 2021-08-04 – 2021-08-05 (×2): 50 mg via ORAL
  Filled 2021-08-03 (×2): qty 1

## 2021-08-03 MED ORDER — POLYVINYL ALCOHOL 1.4 % OP SOLN
2.0000 [drp] | OPHTHALMIC | Status: DC | PRN
  Filled 2021-08-03: qty 15

## 2021-08-03 MED ORDER — LIDOCAINE 2% (20 MG/ML) 5 ML SYRINGE
INTRAMUSCULAR | Status: AC
  Filled 2021-08-03: qty 5

## 2021-08-03 MED ORDER — ONDANSETRON HCL 4 MG/2ML IJ SOLN
4.0000 mg | Freq: Once | INTRAMUSCULAR | Status: AC
  Administered 2021-08-03: 4 mg via INTRAVENOUS
  Filled 2021-08-03: qty 2

## 2021-08-03 MED ORDER — NALOXONE HCL 0.4 MG/ML IJ SOLN
INTRAMUSCULAR | Status: AC
  Administered 2021-08-03: 0.4 mg via INTRAVENOUS
  Filled 2021-08-03: qty 1

## 2021-08-03 MED ORDER — FENTANYL CITRATE (PF) 100 MCG/2ML IJ SOLN
INTRAMUSCULAR | Status: DC | PRN
  Administered 2021-08-03 (×2): 25 ug via INTRAVENOUS

## 2021-08-03 MED ORDER — LACTATED RINGERS IV SOLN: INTRAVENOUS | Status: DC

## 2021-08-03 MED ORDER — ONDANSETRON HCL 4 MG/2ML IJ SOLN
INTRAMUSCULAR | Status: AC
  Filled 2021-08-03: qty 2

## 2021-08-03 MED ORDER — LACTATED RINGERS IV SOLN: INTRAVENOUS | Status: DC | PRN

## 2021-08-03 MED ORDER — SENNOSIDES-DOCUSATE SODIUM 8.6-50 MG PO TABS: 1.0000 | ORAL_TABLET | Freq: Every evening | ORAL | Status: DC | PRN

## 2021-08-03 MED ORDER — ONDANSETRON HCL 4 MG/2ML IJ SOLN
4.0000 mg | Freq: Four times a day (QID) | INTRAMUSCULAR | Status: DC | PRN
  Administered 2021-08-03: 4 mg via INTRAVENOUS

## 2021-08-03 MED ORDER — MORPHINE SULFATE (PF) 2 MG/ML IV SOLN
1.0000 mg | INTRAVENOUS | Status: DC | PRN
  Administered 2021-08-04 (×2): 1 mg via INTRAVENOUS
  Filled 2021-08-03 (×2): qty 1

## 2021-08-03 MED ORDER — DEXAMETHASONE SODIUM PHOSPHATE 10 MG/ML IJ SOLN
INTRAMUSCULAR | Status: DC | PRN
  Administered 2021-08-03: 4 mg via INTRAVENOUS

## 2021-08-03 MED ORDER — PROPOFOL 10 MG/ML IV BOLUS
INTRAVENOUS | Status: AC
  Filled 2021-08-03: qty 20

## 2021-08-03 MED ORDER — LIDOCAINE 2% (20 MG/ML) 5 ML SYRINGE
INTRAMUSCULAR | Status: DC | PRN
Start: 2021-08-03 — End: 2021-08-03
  Administered 2021-08-03: 40 mg via INTRAVENOUS

## 2021-08-03 MED ORDER — EPHEDRINE SULFATE-NACL 50-0.9 MG/10ML-% IV SOSY
PREFILLED_SYRINGE | INTRAVENOUS | Status: DC | PRN
  Administered 2021-08-03: 10 mg via INTRAVENOUS

## 2021-08-03 MED ORDER — PHENYLEPHRINE 40 MCG/ML (10ML) SYRINGE FOR IV PUSH (FOR BLOOD PRESSURE SUPPORT)
PREFILLED_SYRINGE | INTRAVENOUS | Status: DC | PRN
  Administered 2021-08-03 (×2): 120 ug via INTRAVENOUS

## 2021-08-03 MED ORDER — METOCLOPRAMIDE HCL 5 MG/ML IJ SOLN: 5.0000 mg | Freq: Three times a day (TID) | INTRAMUSCULAR | Status: DC | PRN

## 2021-08-03 MED ORDER — METOCLOPRAMIDE HCL 5 MG PO TABS: 5.0000 mg | ORAL_TABLET | Freq: Three times a day (TID) | ORAL | Status: DC | PRN

## 2021-08-03 MED ORDER — AMLODIPINE BESYLATE 5 MG PO TABS
5.0000 mg | ORAL_TABLET | Freq: Every day | ORAL | Status: DC
  Administered 2021-08-04: 5 mg via ORAL
  Filled 2021-08-03: qty 1

## 2021-08-03 MED ORDER — FENTANYL CITRATE (PF) 100 MCG/2ML IJ SOLN
INTRAMUSCULAR | Status: AC
  Filled 2021-08-03: qty 2

## 2021-08-03 SURGICAL SUPPLY — 38 items
APL PRP STRL LF DISP 70% ISPRP (MISCELLANEOUS) ×1
BAG COUNTER SPONGE SURGICOUNT (BAG) IMPLANT
BAG SPEC THK2 15X12 ZIP CLS (MISCELLANEOUS)
BAG SPNG CNTER NS LX DISP (BAG)
BAG ZIPLOCK 12X15 (MISCELLANEOUS) IMPLANT
BIT DRILL CANN LG 4.3MM (BIT) IMPLANT
BNDG GAUZE ELAST 4 BULKY (GAUZE/BANDAGES/DRESSINGS) ×2 IMPLANT
BOOTIES KNEE HIGH SLOAN (MISCELLANEOUS) ×2 IMPLANT
CHLORAPREP W/TINT 26 (MISCELLANEOUS) ×2 IMPLANT
COVER PERINEAL POST (MISCELLANEOUS) ×2 IMPLANT
COVER SURGICAL LIGHT HANDLE (MISCELLANEOUS) ×2 IMPLANT
DRAPE C-ARM 42X120 X-RAY (DRAPES) ×2 IMPLANT
DRAPE SHEET LG 3/4 BI-LAMINATE (DRAPES) ×2 IMPLANT
DRAPE STERI IOBAN 125X83 (DRAPES) ×2 IMPLANT
DRESSING MEPILEX FLEX 4X4 (GAUZE/BANDAGES/DRESSINGS) IMPLANT
DRILL BIT CANN LG 4.3MM (BIT) ×2
DRSG MEPILEX FLEX 4X4 (GAUZE/BANDAGES/DRESSINGS) ×6
DRSG TEGADERM 4X4.75 (GAUZE/BANDAGES/DRESSINGS) ×8 IMPLANT
ELECT REM PT RETURN 15FT ADLT (MISCELLANEOUS) ×2 IMPLANT
GAUZE SPONGE 4X4 12PLY STRL (GAUZE/BANDAGES/DRESSINGS) ×2 IMPLANT
GLOVE SRG 8 PF TXTR STRL LF DI (GLOVE) ×1 IMPLANT
GLOVE SURG ENC TEXT LTX SZ7.5 (GLOVE) ×2 IMPLANT
GLOVE SURG UNDER POLY LF SZ8 (GLOVE) ×2
GOWN STRL REUS W/TWL XL LVL3 (GOWN DISPOSABLE) ×2 IMPLANT
GUIDEPIN 3.2X17.5 THRD DISP (PIN) ×1 IMPLANT
HIP FRA NAIL LAG SCREW 10.5X90 (Orthopedic Implant) ×2 IMPLANT
KIT BASIN OR (CUSTOM PROCEDURE TRAY) ×2 IMPLANT
KIT TURNOVER KIT A (KITS) ×2 IMPLANT
MANIFOLD NEPTUNE II (INSTRUMENTS) ×2 IMPLANT
NAIL HIP FRACT 130D 11X180 (Screw) ×1 IMPLANT
PACK GENERAL/GYN (CUSTOM PROCEDURE TRAY) ×2 IMPLANT
PROTECTOR NERVE ULNAR (MISCELLANEOUS) ×2 IMPLANT
SCREW BONE CORTICAL 5.0X32 (Screw) ×1 IMPLANT
SCREW LAG HIP FRA NAIL 10.5X90 (Orthopedic Implant) IMPLANT
STAPLER VISISTAT 35W (STAPLE) ×2 IMPLANT
SUT MNCRL AB 3-0 PS2 18 (SUTURE) ×2 IMPLANT
SUT PDS AB 2-0 CT2 27 (SUTURE) ×2 IMPLANT
TOWEL OR 17X26 10 PK STRL BLUE (TOWEL DISPOSABLE) ×2 IMPLANT

## 2021-08-03 NOTE — Plan of Care (Signed)
  Problem: Clinical Measurements: Goal: Respiratory complications will improve Outcome: Progressing   Problem: Clinical Measurements: Goal: Cardiovascular complication will be avoided Outcome: Progressing   Problem: Coping: Goal: Level of anxiety will decrease Outcome: Progressing   Problem: Elimination: Goal: Will not experience complications related to bowel motility Outcome: Progressing   Problem: Skin Integrity: Goal: Risk for impaired skin integrity will decrease Outcome: Progressing   Problem: Self-Concept: Goal: Ability to maintain and perform role responsibilities to the fullest extent possible will improve Outcome: Progressing

## 2021-08-03 NOTE — ED Notes (Signed)
Called [facility], per staff. Pt tested positive COVID August 3rd,2022-verified by Rozell Searing at [facility]. No lab sheet available for fax.

## 2021-08-03 NOTE — ED Triage Notes (Signed)
Patient brought in via ems from [facility]. Staff went in to check on patient, found her on the floor. Unknown fall time. Staff picked her up and placed her in the bed. Patient c/o left hip pain with deformity

## 2021-08-03 NOTE — Consult Note (Signed)
Reason for Consult: Left intertrochanteric hip fracture Referring Physician: [facility]  Tara Nolan is an 87 y.o. female.  HPI: Patient had a fall at her assisted living.  She normally uses a wheelchair and does some standing.  She was found to have a left intertrochanteric hip fracture.  Patient complained of pain in her left hip.  Orthopedics was consulted.  Her son is at bedside.  They are amenable to proceeding with surgery.  Past Medical History:  Diagnosis Date   Dementia (HCC)    Dysphagia    Hypothyroidism (acquired)    Lumbar degenerative disc disease    Protein calorie malnutrition (HCC)    Stroke (HCC)    Weight loss     Past Surgical History:  Procedure Laterality Date   ABDOMINAL HYSTERECTOMY     APPENDECTOMY     HERNIA REPAIR     IR KYPHO EA ADDL LEVEL THORACIC OR LUMBAR  02/03/2020   IR KYPHO LUMBAR INC FX REDUCE BONE BX UNI/BIL CANNULATION INC/IMAGING  02/03/2020   THYROIDECTOMY      History reviewed. No pertinent family history.  Social History:  reports that she has never smoked. She has never used smokeless tobacco. She reports that she does not drink alcohol and does not use drugs.  Allergies: No Known Allergies  Medications: I have reviewed the patient's current medications.  Results for orders placed or performed during the hospital encounter of 08/03/21 (from the past 48 hour(s))  Comprehensive metabolic panel     Status: Abnormal   Collection Time: 08/03/21 10:01 AM  Result Value Ref Range   Sodium 138 135 - 145 mmol/L   Potassium 3.5 3.5 - 5.1 mmol/L   Chloride 106 98 - 111 mmol/L   CO2 24 22 - 32 mmol/L   Glucose, Bld 109 (H) 70 - 99 mg/dL    Comment: Glucose reference range applies only to samples taken after fasting for at least 8 hours.   BUN 14 8 - 23 mg/dL   Creatinine, Ser 1.910.66 0.44 - 1.00 mg/dL   Calcium 8.6 (L) 8.9 - 10.3 mg/dL   Total Protein 7.0 6.5 - 8.1 g/dL   Albumin 3.7 3.5 - 5.0 g/dL   AST 22 15 -  41 U/L   ALT 14 0 - 44 U/L   Alkaline Phosphatase 64 38 - 126 U/L   Total Bilirubin 0.5 0.3 - 1.2 mg/dL   GFR, Estimated >47>60 >82>60 mL/min    Comment: (NOTE) Calculated using the CKD-EPI Creatinine Equation (2021)    Anion gap 8 5 - 15    Comment: Performed at [facility], [address] [place], [place] [address]  CBC with Differential     Status: Abnormal   Collection Time: 08/03/21 10:01 AM  Result Value Ref Range   WBC 8.5 4.0 - 10.5 K/uL   RBC 3.43 (L) 3.87 - 5.11 MIL/uL   Hemoglobin 11.0 (L) 12.0 - 15.0 g/dL   HCT 30.833.2 (L) 65.736.0 - 84.646.0 %   MCV 96.8 80.0 - 100.0 fL   MCH 32.1 26.0 - 34.0 pg   MCHC 33.1 30.0 - 36.0 g/dL   RDW 96.217.7 (H) 95.211.5 - 84.115.5 %   Platelets 284 150 - 400 K/uL   nRBC 0.0 0.0 - 0.2 %   Neutrophils Relative % 77 %   Neutro Abs 6.6 1.7 - 7.7 K/uL   Lymphocytes Relative 13 %   Lymphs Abs 1.1 0.7 - 4.0 K/uL   Monocytes  Relative 7 %   Monocytes Absolute 0.6 0.1 - 1.0 K/uL   Eosinophils Relative 1 %   Eosinophils Absolute 0.1 0.0 - 0.5 K/uL   Basophils Relative 1 %   Basophils Absolute 0.0 0.0 - 0.1 K/uL   Immature Granulocytes 1 %   Abs Immature Granulocytes 0.06 0.00 - 0.07 K/uL    Comment: Performed at [facility], [address], [place], [place] [address]  Protime-INR     Status: None   Collection Time: 08/03/21 10:01 AM  Result Value Ref Range   Prothrombin Time 15.1 11.4 - 15.2 seconds   INR 1.2 0.8 - 1.2    Comment: (NOTE) INR goal varies based on device and disease states. Performed at [facility], [address] [place], [place] [address]   Resp Panel by RT-PCR (Flu A&B, Covid) Nasopharyngeal Swab     Status: Abnormal   Collection Time: 08/03/21 10:01 AM   Specimen: Nasopharyngeal Swab; Nasopharyngeal(NP) swabs in vial transport medium  Result Value Ref Range   SARS Coronavirus 2 by RT PCR POSITIVE (A) NEGATIVE    Comment: RESULTS CONFIRMED BY MANUAL DILUTION RESULT CALLED TO, READ  BACK BY AND VERIFIED WITH: HALL J. RN ON 08/03/2021 @ 1144 BY MECIAL J (NOTE) SARS-CoV-2 target nucleic acids are DETECTED.  The SARS-CoV-2 RNA is generally detectable in upper respiratory specimens during the acute phase of infection. Positive results are indicative of the presence of the identified virus, but do not rule out bacterial infection or co-infection with other pathogens not detected by the test. Clinical correlation with patient history and other diagnostic information is necessary to determine patient infection status. The expected result is Negative.  Fact Sheet for Patients: BloggerCourse.com  Fact Sheet for Healthcare Providers: SeriousBroker.it  This test is not yet approved or cleared by the [place] FDA and  has been authorized for detection and/or diagnosis of SARS-CoV-2 by FDA under an Emergency Use Authorization (EUA).  This EUA  will remain in effect (meaning this test can be used) for the duration of  the COVID-19 declaration under Section 564(b)(1) of the Act, 21 U.S.C. section 360bbb-3(b)(1), unless the authorization is terminated or revoked sooner.     Influenza A by PCR NEGATIVE NEGATIVE   Influenza B by PCR NEGATIVE NEGATIVE    Comment: (NOTE) The Xpert Xpress SARS-CoV-2/FLU/RSV plus assay is intended as an aid in the diagnosis of influenza from Nasopharyngeal swab specimens and should not be used as a sole basis for treatment. Nasal washings and aspirates are unacceptable for Xpert Xpress SARS-CoV-2/FLU/RSV testing.  Fact Sheet for Patients: BloggerCourse.com  Fact Sheet for Healthcare Providers: SeriousBroker.it  This test is not yet approved or cleared by the [place] FDA and has been authorized for detection and/or diagnosis of SARS-CoV-2 by FDA under an Emergency Use Authorization (EUA). This EUA will remain in effect (meaning  this test can be used) for the duration of the COVID-19 declaration under Section 564(b)(1) of the Act, 21 U.S.C. section 360bbb-3(b)(1), unless the authorization is terminated or revoked.  Performed at [facility], [address], [place], [place] [address]     CT HEAD WO CONTRAST ( )  Result Date: 08/03/2021 CLINICAL DATA:  Unwitnessed fall, found on floor EXAM: CT HEAD WITHOUT CONTRAST CT CERVICAL SPINE WITHOUT CONTRAST TECHNIQUE: Multidetector CT imaging of the head and cervical spine was performed following the standard protocol without intravenous contrast. Multiplanar CT image reconstructions of the cervical spine were also generated. COMPARISON:  08/21/2020 FINDINGS: CT HEAD FINDINGS Brain: No evidence of acute infarction, hemorrhage, hydrocephalus, extra-axial collection or mass lesion/mass effect. Periventricular and deep white matter hypodensity. Mild global cerebral volume loss. Vascular: No hyperdense vessel or unexpected calcification. Skull: Normal. Negative for fracture or focal lesion. Sinuses/Orbits: No acute finding. Other: None. CT CERVICAL SPINE FINDINGS Alignment: Normal. Skull base and vertebrae: No acute fracture. No primary bone lesion or focal pathologic process. Soft tissues and spinal canal: No prevertebral fluid or swelling. No visible canal hematoma. Disc levels: Moderate multilevel disc space height loss and osteophytosis. Upper chest: Negative. Other: None. IMPRESSION: 1. No acute intracranial pathology. Small-vessel white matter disease and mild global cerebral volume loss in keeping with advanced patient age. 2. No fracture or static subluxation of the cervical spine. 3. Moderate multilevel cervical disc degenerative disease. Electronically Signed   By: Lauralyn Primes M.D.   On: 08/03/2021 11:01   CT Cervical Spine Wo Contrast  Result Date: 08/03/2021 CLINICAL DATA:  Unwitnessed fall, found on floor EXAM: CT HEAD WITHOUT CONTRAST CT CERVICAL SPINE  WITHOUT CONTRAST TECHNIQUE: Multidetector CT imaging of the head and cervical spine was performed following the standard protocol without intravenous contrast. Multiplanar CT image reconstructions of the cervical spine were also generated. COMPARISON:  08/21/2020 FINDINGS: CT HEAD FINDINGS Brain: No evidence of acute infarction, hemorrhage, hydrocephalus, extra-axial collection or mass lesion/mass effect. Periventricular and deep white matter hypodensity. Mild global cerebral volume loss. Vascular: No hyperdense vessel or unexpected calcification. Skull: Normal. Negative for fracture or focal lesion. Sinuses/Orbits: No acute finding. Other: None. CT CERVICAL SPINE FINDINGS Alignment: Normal. Skull base and vertebrae: No acute fracture. No primary bone lesion or focal pathologic process. Soft tissues and spinal canal: No prevertebral fluid or swelling. No visible canal hematoma. Disc levels: Moderate multilevel disc space height loss and osteophytosis. Upper chest: Negative. Other: None. IMPRESSION: 1. No acute intracranial pathology. Small-vessel white matter disease and mild global cerebral volume loss in keeping with advanced patient age. 2. No fracture or static subluxation of the cervical spine. 3. Moderate multilevel cervical disc degenerative disease. Electronically Signed   By: Lauralyn Primes M.D.   On: 08/03/2021 11:01   DG Chest Portable 1 View  Result Date: 08/03/2021 CLINICAL DATA:  Larey Seat. Left hip fracture. EXAM: PORTABLE CHEST 1 VIEW COMPARISON:  Chest x-ray 08/21/2020 FINDINGS: The cardiac silhouette, mediastinal and hilar contours are within normal limits given the patient's age, AP projection and portable technique. There is moderate tortuosity and calcification of the thoracic aorta. Chronic pulmonary scarring changes but no acute pulmonary findings. The bony thorax is grossly intact. IMPRESSION: Chronic lung changes but no acute pulmonary findings. Electronically Signed   By: Rudie Meyer M.D.    On: 08/03/2021 09:55   DG Hip Unilat W or Wo Pelvis 2-3 Views Left  Result Date: 08/03/2021 CLINICAL DATA:  Larey Seat. Left hip pain. EXAM: DG HIP (WITH OR WITHOUT PELVIS) 2-3V LEFT COMPARISON:  08/21/2020 FINDINGS: There is a displaced intertrochanteric fracture of the left hip with a severe varus deformity. The hips are normally located. The bony pelvis is grossly intact. IMPRESSION: Displaced intertrochanteric fracture of the left hip. Electronically Signed   By: Rudie Meyer M.D.   On: 08/03/2021 09:56    Review of Systems  Unable to perform ROS: Dementia  Blood pressure 116/88, pulse (!) 105, temperature 97.7 F (36.5 C), temperature source Axillary, resp. rate 16, height 5\' 5"  (1.651 m), weight 38.6 kg, SpO2 96 %. Physical Exam HENT:  Head: Atraumatic.     Mouth/Throat:     Mouth: Mucous membranes are moist.  Eyes:     Extraocular Movements: Extraocular movements intact.  Cardiovascular:     Rate and Rhythm: Tachycardia present.  Pulmonary:     Effort: Pulmonary effort is normal.  Abdominal:     General: Abdomen is flat.  Musculoskeletal:     Cervical back: Neck supple.     Comments: Left hip pain.  Shortened.  Hip is rotated.  Shortened.  Tender to palpation.  Did not assess for range of motion or strength.  Skin:    General: Skin is warm.  Neurological:     General: No focal deficit present.     Mental Status: She is alert.  Psychiatric:        Mood and Affect: Mood normal.    Assessment/Plan: Patient has a left intertrochanteric hip fracture.  It is shortened and rotated.  She does stand some but mostly uses a wheelchair.  She is indicated for cephalomedullary nail of her intertrochanteric hip fracture given the displacement and nature of the fracture.  We will plan to do that today.  Keep patient NPO.  Patient's son is amenable to proceeding with surgery as he is her power of attorney.  We discussed the risk, benefits and alternatives of surgery which include but are not  limited to wound healing complication, infection, nonunion, malunion, need for further surgery and damage to surrounding structures.  We briefly discussed the anesthetic risk in the perioperative mortality rate of hip fractures in this patient population.  He would like to proceed.  Terance Hart 08/03/2021, 2:21 PM

## 2021-08-03 NOTE — Anesthesia Preprocedure Evaluation (Signed)
Anesthesia Evaluation  Patient identified by MRN, date of birth, ID band Patient awake    Reviewed: Allergy & Precautions, NPO status , Patient's Chart, lab work & pertinent test results  Airway Mallampati: II  TM Distance: >3 FB Neck ROM: Full    Dental no notable dental hx.    Pulmonary neg pulmonary ROS,    Pulmonary exam normal breath sounds clear to auscultation       Cardiovascular negative cardio ROS Normal cardiovascular exam Rhythm:Regular Rate:Normal     Neuro/Psych Dementia CVA negative psych ROS   GI/Hepatic negative GI ROS, Neg liver ROS,   Endo/Other  Hypothyroidism   Renal/GU negative Renal ROS  negative genitourinary   Musculoskeletal  (+) Arthritis , Osteoarthritis,    Abdominal   Peds negative pediatric ROS (+)  Hematology negative hematology ROS (+)   Anesthesia Other Findings   Reproductive/Obstetrics negative OB ROS                             Anesthesia Physical Anesthesia Plan  ASA: 3 and emergent  Anesthesia Plan: General   Post-op Pain Management:    Induction: Intravenous  PONV Risk Score and Plan: 3 and Ondansetron, Dexamethasone, Midazolam and Treatment may vary due to age or medical condition  Airway Management Planned: Oral ETT  Additional Equipment:   Intra-op Plan:   Post-operative Plan: Extubation in OR  Informed Consent: I have reviewed the patients History and Physical, chart, labs and discussed the procedure including the risks, benefits and alternatives for the proposed anesthesia with the patient or authorized representative who has indicated his/her understanding and acceptance.     Dental advisory given  Plan Discussed with: CRNA  Anesthesia Plan Comments:         Anesthesia Quick Evaluation

## 2021-08-03 NOTE — Transfer of Care (Signed)
Immediate Anesthesia Transfer of Care Note  Patient: Tara Nolan  Procedure(s) Performed: Procedure(s): INTRAMEDULLARY (IM) NAIL FEMORAL (N/A)  Patient Location: PACU  Anesthesia Type:General  Level of Consciousness: Alert, Awake, Oriented  Airway & Oxygen Therapy: Patient Spontanous Breathing  Post-op Assessment: Report given to RN  Post vital signs: Reviewed and stable  Last Vitals:  Vitals:   08/03/21 1056 08/03/21 1255  BP:  116/88  Pulse:  (!) 105  Resp:  16  Temp: (!) 36.4 C 36.5 C  SpO2:  96%    Complications: No apparent anesthesia complications

## 2021-08-03 NOTE — Anesthesia Procedure Notes (Signed)
Procedure Name: Intubation Date/Time: 08/03/2021 2:50 PM Performed by: Florene Route, CRNA Pre-anesthesia Checklist: Patient identified, Emergency Drugs available, Suction available and Patient being monitored Patient Re-evaluated:Patient Re-evaluated prior to induction Oxygen Delivery Method: Circle system utilized Preoxygenation: Pre-oxygenation with 100% oxygen Induction Type: IV induction Ventilation: Mask ventilation without difficulty Laryngoscope Size: Miller and 2 Grade View: Grade I Tube type: Oral Tube size: 7.0 mm Number of attempts: 1 Airway Equipment and Method: Stylet and Oral airway Placement Confirmation: ETT inserted through vocal cords under direct vision, positive ETCO2 and breath sounds checked- equal and bilateral Secured at: 20 cm Tube secured with: Tape Dental Injury: Teeth and Oropharynx as per pre-operative assessment

## 2021-08-03 NOTE — H&P (Signed)
History and Physical    Tara Nolan VZC:[id number] DOB: 05/08/1919 DOA: 08/03/2021  PCP: [name], MD  Patient coming from: Skilled nursing facility  I have personally briefly reviewed patient's old medical records in [facility].  Chief Complaint: Fall at skilled nursing facility, [facility]  HPI: Tara Nolan is a 87 y.o. female with medical history significant for dementia, hypothyroidism, lumbar degenerative disc disease, recent COVID infection with positive test on 06/28/2021, who lives at a skilled nursing facility and is under hospice care (for weight loss and malnutrition), who presents to the emergency department on 08/03/21 with pain after fall.  Her fall was unwitnessed but occurred on 08/03/2021.  After the fall, the patient had pain in her left hip.  Hip pain did not radiate, was described as sharp, was alleviated by nothing and exacerbated by movement. Associated symptoms: Also had left rib pain.  At baseline she has ambulatory dysfunction and uses a wheelchair. No shortness of breath or cough.  ED Course: Imaging revealed left intertrochanteric hip fracture.  SARS-CoV-2 testing was positive, which was expected because she had a recent infection; she had already completed her isolation. Head CT and C-spine CT showed no acute fractures.  Review of Systems: As per HPI otherwise all other systems reviewed and are unremarable.  CARDIOVASCULAR: No chest pain or palpitations.    Past Medical History:  Diagnosis Date   Dementia (HCC)    Dysphagia    Hypothyroidism (acquired)    Lumbar degenerative disc disease    Protein calorie malnutrition (HCC)    Stroke (HCC)    Weight loss     Past Surgical History:  Procedure Laterality Date   ABDOMINAL HYSTERECTOMY     APPENDECTOMY     HERNIA REPAIR     IR KYPHO EA ADDL LEVEL THORACIC OR LUMBAR  02/03/2020   IR KYPHO LUMBAR INC FX REDUCE BONE BX UNI/BIL CANNULATION INC/IMAGING  02/03/2020   THYROIDECTOMY       Social History  reports that she has never smoked. She has never used smokeless tobacco. She reports that she does not drink alcohol and does not use drugs.  No Known Allergies  Family History  Problem Relation Age of Onset   Cancer Father        Nasal cancer.   Heart disease Brother    Heart disease Brother      Home Medications  Prior to Admission medications   Medication Sig Start Date End Date Taking? Authorizing Provider  liver oil-zinc oxide (DESITIN) 40 % ointment Apply 1 application topically See admin instructions. Apply to buttocks twice daily and as needed for personal care   Yes [provider]  polyethylene glycol (MIRALAX / GLYCOLAX) 17 g packet Take 17 g by mouth daily. 02/04/20  Yes [name], MD  PSYLLIUM PO Take 500 mg by mouth daily.   Yes [provider]  senna-docusate (SENOKOT-S) 8.6-50 MG tablet Take 1 tablet by mouth at bedtime as needed for mild constipation or moderate constipation. 02/04/20  Yes [name], MD  traMADol (ULTRAM) 50 MG tablet Take 50 mg by mouth 2 (two) times daily as needed (chest wall pain).   Yes [provider]  traZODone (DESYREL) 50 MG tablet Take 50 mg by mouth at bedtime.   Yes [provider]  acetaminophen (TYLENOL) 325 MG tablet Take 1 tablet (325 mg total) by mouth every 6 (six) hours. Patient taking differently: Take 650 mg by mouth daily as needed.  Two tabs qd prn 02/04/20   Alwyn RenMathews, Elizabeth G, MD  amLODipine (NORVASC) 5 MG tablet Take 1 tablet (5 mg total) by mouth daily. 04/16/20   Elpidio AnisUpstill, Shari, PA-C  carboxymethylcellulose 1 % ophthalmic solution 2 drops every 2 (two) hours as needed. As needed for dry eyes    [provider]  HM LIDOCAINE PATCH 4 % PTCH Apply topically. Apply to lower back in am and remove qhs    [provider]  levothyroxine (SYNTHROID) 50 MCG tablet Take 50 mcg by mouth daily before breakfast.    [provider]     Physical Exam: Vitals:   08/03/21 0847 08/03/21 0852 08/03/21 0952  BP: 113/79  124/69  Pulse: (!) 105  73  Resp: 14  15  Temp: 97.6 F (36.4 C)    TempSrc: Oral    SpO2: 94%  95%  Weight:  38.6 kg   Height:  5\' 5"  (1.651 m)     Constitutional: NAD, calm, comfortable, ill-appearing. Vitals:   08/03/21 0847 08/03/21 0852 08/03/21 0952  BP: 113/79  124/69  Pulse: (!) 105  73  Resp: 14  15  Temp: 97.6 F (36.4 C)    TempSrc: Oral    SpO2: 94%  95%  Weight:  38.6 kg   Height:  5\' 5"  (1.651 m)    Eyes: Pupils equal and round, lids and conjunctivae without icterus or erythema. ENMT: Mucous membranes are dry. Posterior pharynx clear of any exudate or lesions. Nares patent without discharge or bleeding.  Normocephalic, atraumatic.  Normal dentition.  Neck: normal, supple, no masses, trachea midline.  Thyroid nontender, no masses appreciated, no thyromegaly. Respiratory: clear to auscultation bilaterally. Chest wall movements are symmetric. No wheezing, no crackles.  No rhonchi.  Normal respiratory effort. No accessory muscle use.  Cardiovascular: Regular rate and rhythm, no murmurs / rubs / gallops. Pulses: DP pulses 2+ bilaterally. No carotid bruits.  Capillary refill less than 3 seconds. Edema: None bilaterally. GI: soft, non-distended, normal active bowel sounds. No hepatosplenomegaly. No rigidity, rebound, or guarding. Non-tender. No masses palpated.  Musculoskeletal: no clubbing / cyanosis. No joint deformity upper extremities. Good ROM, no contractures. Normal muscle tone.  Tenderness over the left hip; left leg is externally rotated and shortened. Integument: no rashes, lesions, ulcers. No induration. Clean, dry, intact. Pale. Neurologic: CN 2-12 grossly intact. Sensation grossly intact to light touch. DTR 2+ bilaterally.  Babinski: Toes downgoing bilaterally.  Strength at least 3/5 in upper extremities, at least 2/5 in right lower extremity, left lower extremity not tested  due to fracture. No pronator drift. Psychiatric: Poor insight. Alert and oriented to person. Normal mood.  Somnolent. Lymphatic: No cervical lymphadenopathy. No supraclavicular lymphadenopathy.   Labs on Admission: I have personally reviewed the following labs and imaging studies.  CBC: Recent Labs  Lab 08/03/21 1001  WBC 8.5  NEUTROABS 6.6  HGB 11.0*  HCT 33.2*  MCV 96.8  PLT 284    Basic Metabolic Panel: Recent Labs  Lab 08/03/21 1001  NA 138  K 3.5  CL 106  CO2 24  GLUCOSE 109*  BUN 14  CREATININE 0.66  CALCIUM 8.6*    GFR: Estimated Creatinine Clearance: 23.4 mL/min (by C-G formula based on SCr of 0.66 mg/dL).  Liver Function Tests: Recent Labs  Lab 08/03/21 1001  AST 22  ALT 14  ALKPHOS 64  BILITOT 0.5  PROT 7.0  ALBUMIN 3.7    Radiological Exams on Admission:  Imaging viewed personally.  DG  Chest Portable 1 View  Result Date: 08/03/2021 CLINICAL DATA:  Larey Seat. Left hip fracture. EXAM: PORTABLE CHEST 1 VIEW COMPARISON:  Chest x-ray 08/21/2020 FINDINGS: The cardiac silhouette, mediastinal and hilar contours are within normal limits given the patient's age, AP projection and portable technique. There is moderate tortuosity and calcification of the thoracic aorta. Chronic pulmonary scarring changes but no acute pulmonary findings. The bony thorax is grossly intact. IMPRESSION: Chronic lung changes but no acute pulmonary findings. Electronically Signed   By: Rudie Meyer M.D.   On: 08/03/2021 09:55   DG Hip Unilat W or Wo Pelvis 2-3 Views Left  Result Date: 08/03/2021 CLINICAL DATA:  Larey Seat. Left hip pain. EXAM: DG HIP (WITH OR WITHOUT PELVIS) 2-3V LEFT COMPARISON:  08/21/2020 FINDINGS: There is a displaced intertrochanteric fracture of the left hip with a severe varus deformity. The hips are normally located. The bony pelvis is grossly intact. IMPRESSION: Displaced intertrochanteric fracture of the left hip. Electronically Signed   By: Rudie Meyer M.D.   On:  08/03/2021 09:56    EKG: Independently reviewed. From 04/15/2020: 90 bpm. Normal sinus rhythm. RBBB. LAFB. T wave inversion in V3.    Assessment/Plan  Principal Problem:   Closed intertrochanteric fracture of left femur, initial encounter (HCC) Plan: No history of heart disease. No on anticoagulation. Plan to proceed with surgery. Orthopedic surgeon consulted by ED.   Active Problems:    Fall, initial encounter Plan: Noted. Hip surgery planned. Then PT.    Hypothyroid Plan: Synthroid.    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    Lumbar degenerative disc disease  Plan: Continue home medications.    DVT prophylaxis: SCDs.  Code Status:   DNR - patient presents with a portable DNR order. verified code status with son at bedside.  Family Communication:  Son, at bedside   [place], [place] Home: [phone]   Cell [phone]     Disposition Plan:   Patient is from:  Skilled facility  Anticipated DC to:  Skilled facility  Anticipated DC date:  08/06/21  Anticipated DC barriers: Pain  Consults called:  Orthopedic surgeon called by ED: Dr. Dub Mikes  Admission status:  Inpatient   Severity of Illness: The appropriate patient status for this patient is INPATIENT. Inpatient status is judged to be reasonable and necessary in order to provide the required intensity of service to ensure the patient's safety. The patient's presenting symptoms, physical exam findings, and initial radiographic and laboratory data in the context of their chronic comorbidities is felt to place them at high risk for further clinical deterioration. Furthermore, it is not anticipated that the patient will be medically stable for discharge from the hospital within 2 midnights of admission. The following factors support the patient status of inpatient.   " The patient's presenting symptoms include left hip  pain. " The worrisome physical exam findings include left hip tenderness. " The initial radiographic and laboratory data are worrisome because of left hip fracture.. " The chronic co-morbidities include hypothyroidism, dementia.   * I certify that at the point of admission it is my clinical judgment that the patient will require inpatient hospital care spanning beyond 2 midnights from the point of admission due to high intensity of service, high risk for further deterioration and high frequency of surveillance required.Marlow Baars MD [facility]  How to contact the [facility] Attending or Consulting provider 7A - 7P or covering provider  during after hours 7P -7A, for this patient?   Check the care team in [facility] and look for a) attending/consulting [facility] provider listed and b) the [facility] team listed Log into [email or web address] and use [place]'s universal password to access. If you do not have the password, please contact the hospital operator. Locate the [facility] provider you are looking for under [facility] and page to a number that you can be directly reached. If you still have difficulty reaching the provider, please page the [facility] (Director on Call) for the Hospitalists listed on amion for assistance.  08/03/2021, 10:30 AM

## 2021-08-03 NOTE — Progress Notes (Signed)
Tara Nolan [address] [facility]) Hospitalized Hospice Patient   Tara Nolan is a current hospice patient with a terminal diagnosis of cerebrovascular disease. Tara Nolan was found in her room by staff screaming c/o hip pain. She was transported to [facility] ED for evaluation of her hip pain. She is admitted with a left intertrochanteric hip fracture. This is a related hospital admission per Dr. Montez Morita with [facility].  Visited patient at bedside. She was asleep and appeared to be comfortable. Report received from her nurse that she had been in substantial pain and was planning to go to the OR for nailing of her left hip. Son had been at the bedside with her but recently stepped out. Was able to reach son via the phone, his goals are her comfort and to see if she is able to return to her previous functional status. At baseline, she is able to pivot to her wheelchair and mostly propels herself with her feet or grabbing with her hands.  V/S: 97.7 oral, 116/88, HR 105, RR 16, SPO2 96 % on RA I&O: 950/30 Labs: RBC 3.43, hgb 11, HCT 33.2 Diagnostics: DG HIP (WITH OR WITHOUT PELVIS) 2-3V LEFT - There is a displaced intertrochanteric fracture of the left hip with a severe varus deformity. IV/PRNs: fentanyl 100 mcg IV x 1, morphine 2 mg IV x 1, zofran 4 mg IV x 1, ancef 2 g IV x 1, LR 75 ml/hr continuous, dilaudid 0.25 mg IV x 1  Problem List: - left intertrochanteric fracture - nailing of left hip on 9/8, pt currently in PACU  She is inpatient appropriate as her pain and hip fracture needed to be managed operatively.    GOC: Relief of pain from hip fracture. At baseline, she is mostly wheelchair bound but was able to transfer herself.  D/C planning: from [facility] [place]. Son plans to meet with director on Friday and discuss if they can continue to provide care for her as her level of care will have changed. Family: son is NOK and Management consultant IDT: updated  Transfer summary and med list to  shadow chart.   Transportation for our current hospice patients is provided by [facility], as they contract this service for our active hospice patients.  Wallis Bamberg RN, BSN, CCRN [facility] Liaison

## 2021-08-03 NOTE — ED Provider Notes (Signed)
[facility]-EMERGENCY DEPT Provider Note   CSN: [id number] Arrival date & time: 08/03/21  8675     History Chief Complaint  Patient presents with   Fall   Hip Pain    Tara Nolan is a 87 y.o. female with PMH CVA, thyroid disease [place] living at skilled nursing facility who presents to the emergency department for evaluation of a fall.  Unknown fall time.  She arrives with a left hip deformity with a c-collar in place.  She states that "everything hurts" but has a significant amount of pain to the left ribs and left hip.  Suspect left hip dislocation.  No additional external signs of trauma.   Fall Pertinent negatives include no chest pain, no abdominal pain and no shortness of breath.  Hip Pain Pertinent negatives include no chest pain, no abdominal pain and no shortness of breath.      Past Medical History:  Diagnosis Date   Stroke [facility])    Thyroid disease     Patient Active Problem List   Diagnosis Date Noted   Adverse reaction to vaccine, initial encounter 01/30/2020   Compression fracture of lumbar vertebra (HCC) 01/30/2020   Syncope and collapse 11/22/2018   Back pain 11/22/2018   Hypothyroid 11/22/2018   History of CVA (cerebrovascular accident) 09/12/2017   Fall 09/08/2017   Right shoulder pain 09/08/2017   Left wrist fracture 09/08/2017   History of thyroid disease 09/08/2017   Chest discomfort 09/08/2017   Hip pain, acute 06/20/2012   Knee pain 06/20/2012   Hamstring tendon rupture 06/20/2012    Past Surgical History:  Procedure Laterality Date   ABDOMINAL HYSTERECTOMY     APPENDECTOMY     HERNIA REPAIR     IR KYPHO EA ADDL LEVEL THORACIC OR LUMBAR  02/03/2020   IR KYPHO LUMBAR INC FX REDUCE BONE BX UNI/BIL CANNULATION INC/IMAGING  02/03/2020   THYROIDECTOMY       OB History   No obstetric history on file.     History reviewed. No pertinent family history.  Social History   Tobacco Use   Smoking status: Never    Smokeless tobacco: Never  Substance Use Topics   Alcohol use: No   Drug use: No    Home Medications Prior to Admission medications   Medication Sig Start Date End Date Taking? Authorizing Provider  acetaminophen (TYLENOL) 325 MG tablet Take 1 tablet (325 mg total) by mouth every 6 (six) hours. Patient taking differently: Take 650 mg by mouth daily as needed. Two tabs qd prn 02/04/20   [name], MD  amLODipine (NORVASC) 5 MG tablet Take 1 tablet (5 mg total) by mouth daily. 04/16/20   Elpidio Anis, PA-C  carboxymethylcellulose 1 % ophthalmic solution 2 drops every 2 (two) hours as needed. As needed for dry eyes    [provider]  HM LIDOCAINE PATCH 4 % PTCH Apply topically. Apply to lower back in am and remove qhs    [provider]  levothyroxine (SYNTHROID) 50 MCG tablet Take 50 mcg by mouth daily before breakfast.    [provider]  polyethylene glycol (MIRALAX / GLYCOLAX) 17 g packet Take 17 g by mouth daily. 02/04/20   [name], MD  senna-docusate (SENOKOT-S) 8.6-50 MG tablet Take 1 tablet by mouth at bedtime as needed for mild constipation or moderate constipation. 02/04/20   [name], MD  traZODone (DESYREL) 50 MG tablet Take 50 mg by mouth at bedtime.  [provider]    Allergies    Other  Review of Systems   Review of Systems  Constitutional:  Negative for chills and fever.  HENT:  Negative for ear pain and sore throat.   Eyes:  Negative for pain and visual disturbance.  Respiratory:  Negative for cough and shortness of breath.   Cardiovascular:  Negative for chest pain and palpitations.  Gastrointestinal:  Negative for abdominal pain and vomiting.  Genitourinary:  Negative for dysuria and hematuria.  Musculoskeletal:  Negative for arthralgias and back pain.       Left hip, left chest wall pain  Skin:  Negative for color change and rash.  Neurological:  Negative for seizures and syncope.  All other  systems reviewed and are negative.  Physical Exam Updated Vital Signs BP 113/79 (BP Location: Left Arm)   Pulse (!) 105   Temp 97.6 F (36.4 C) (Oral)   Resp 14   Ht 5\' 5"  (1.651 m)   Wt 38.6 kg   SpO2 94% Comment: Simultaneous filing. User may not have seen previous data.  BMI 14.14 kg/m   Physical Exam Vitals and nursing note reviewed.  Constitutional:      General: She is not in acute distress.    Appearance: She is well-developed.  HENT:     Head: Normocephalic and atraumatic.  Eyes:     Conjunctiva/sclera: Conjunctivae normal.  Cardiovascular:     Rate and Rhythm: Normal rate and regular rhythm.     Heart sounds: No murmur heard. Pulmonary:     Effort: Pulmonary effort is normal. No respiratory distress.     Breath sounds: Normal breath sounds.  Abdominal:     Palpations: Abdomen is soft.     Tenderness: There is no abdominal tenderness.  Musculoskeletal:        General: Swelling, tenderness (Left chest wall, left hip) and deformity (Left hip) present.     Cervical back: Neck supple.  Skin:    General: Skin is warm and dry.  Neurological:     Mental Status: She is alert.    ED Results / Procedures / Treatments   Labs (all labs ordered are listed, but only abnormal results are displayed) Labs Reviewed - No data to display  EKG None  Radiology No results found.  Procedures Procedures   Medications Ordered in ED Medications - No data to display  ED Course  I have reviewed the triage vital signs and the nursing notes.  Pertinent labs & imaging results that were available during my care of the patient were reviewed by me and considered in my medical decision making (see chart for details).    MDM Rules/Calculators/A&P                           Patient seen in the emergency department for evaluation of a fall with a left hip deformity.  Physical exam reveals tenderness over the left hip with an obvious deformity but no other external signs of  trauma.  Chest x-ray with no acute traumatic findings, hip x-ray with a left intertrochanteric fracture.  CT head and C-spine are pending and I will follow-up these images and respond accordingly.  Orthopedics consulted who will take the patient to the operating room this afternoon for repair.  Patient admitted to medicine for further management and postoperative care. Final Clinical Impression(s) / ED Diagnoses Final diagnoses:  None    Rx / DC Orders ED Discharge  Orders     None        Yaeko Fazekas, Wyn Forster, MD 08/03/21 1017

## 2021-08-03 NOTE — Anesthesia Postprocedure Evaluation (Signed)
Anesthesia Post Note  Patient: Tara Nolan  Procedure(s) Performed: INTRAMEDULLARY (IM) NAIL FEMORAL     Patient location during evaluation: PACU Anesthesia Type: General Level of consciousness: awake and alert Pain management: pain level controlled Vital Signs Assessment: post-procedure vital signs reviewed and stable Respiratory status: spontaneous breathing, nonlabored ventilation and respiratory function stable Cardiovascular status: blood pressure returned to baseline and stable Postop Assessment: no apparent nausea or vomiting Anesthetic complications: no   No notable events documented.  Last Vitals:  Vitals:   08/03/21 1706 08/03/21 1715  BP: (!) 107/56 (!) 107/56  Pulse:    Resp:    Temp: (!) 36.2 C (!) 36.2 C  SpO2:      Last Pain:  Vitals:   08/03/21 1715  TempSrc:   PainSc: Asleep                 Lowella Curb

## 2021-08-03 NOTE — Op Note (Signed)
Tara Nolan female 87 y.o. 08/03/2021  PreOperative Diagnosis: Left intertochanteric hip fracture  PostOperative Diagnosis: same  PROCEDURE: Cephalomedullary nail of left hip fracture  SURGEON: Dub Mikes, MD  ASSISTANT:  none  ANESTHESIA: General endotracheal tube anesthesia  FINDINGS: Displaced intertrochanteric hip fracture  IMPLANTS: Affixes hip fracture nail 11 x 180  INDICATIONS:87 y.o. femalehad a fall and sustained a intertrochanteric hip fracture.  She was indicated for surgery due to baseline ambulatory status and significant pain due to the fracture.  Discussion was had with the patient about the surgery and they wish to proceed.  Patient understood the risks, benefits and alternatives to surgery which include but are not limited to wound healing complications, infection, nonunion, malunion, need for further surgery as well as damage to surrounding structures. They also understood the potential for continued pain in that there were no guarantees of acceptable outcome After weighing these risks the patient opted to proceed with surgery.  PROCEDURE: Patient was identified in the preoperative holding area and the left hip was marked by myself.  The consent was signed by myself and the patient.  This identified the left lower extremity as the appropriate operative extremity and the surgeries to be performed was operative treatment of the hip fracture. They  were taken to the operating room where general endotracheal anesthesia was induced without difficulty and then was moved supine on a Hana table.  The operative leg was placed into the traction boot.  The non-operative leg was well-padded and affixed to the right leg holder with coban.  The arm was crossed over the chest and well padded. 2 g of Ancef was given.  Surgical timeout was performed.    Using fluoroscopy appropriate AP and lateral x-rays of the hip were obtained.  Then using traction through the Hana  table was able to reduce the fracture fragments in a closed fashion.  Acceptable reduction was confirmed on AP and lateral x-rays.  Then the left hip was prepped and draped in usual sterile fashion.  A shower curtain drape was placed.  We began the surgery by placing the guidepin percutaneously at the appropriate starting point at the tip of the greater trochanter.  The appropriate starting point was confirmed on AP and lateral radiograph.  Then a 3 cm incision was made about the site of the percutaneous placement of the pin and the opening reamer with a soft tissue guide was placed into the wound down to the tip of the greater trochanter and the bone was entered with the opening reamer down to the level of lesser trochanter.  Then the openning reamer and guide pin were removed and a nail was placed. Then we chose an 11 mm cephalo-medullary nail.  This was placed without difficulty and appropriate positioning was confirmed on fluoroscopy.  Then using the blade insertion guide a second incision was made distally down the thigh to allow for placement of the cephalo-medullary screw.  The guidepin for the blade was placed up the femoral neck in the appropriate position center center in the femoral head.  Then the reamer was used to ream for the blade and the blade was placed without difficulty.  The length of the blade was a 90 mm.  Then the set screw was tightened.  Then the distal interlock screw was placed without difficulty.  Final films were taken.  The wounds were irrigated with normal saline and the incisions were closed in a layered fashion using 2-0 monocryl and staples.  Mepilex  dressings were used.  Patient was then transferred to the hospital bed and awakened from anesthesia without difficulty.  Counts were correct at the end the case.  There were no complications.  POST OPERATIVE INSTRUCTIONS: WBAT operative extremity Patient will be discharged on 325 mg aspirin for DVT prophylaxis if not  already on DVT prophylaxis at baseline Follow-up in 2 weeks for x-rays of the operative hip and suture removal if appropriate    BLOOD LOSS:   150cc         DRAINS: none         SPECIMEN: none       COMPLICATIONS:  * No complications entered in OR log *         Disposition: PACU - hemodynamically stable.         Condition: stable

## 2021-08-04 DIAGNOSIS — S72142A Displaced intertrochanteric fracture of left femur, initial encounter for closed fracture: Secondary | ICD-10-CM | POA: Diagnosis not present

## 2021-08-04 LAB — CBC
HCT: 19.2 % — ABNORMAL LOW (ref 36.0–46.0)
Hemoglobin: 6.2 g/dL — CL (ref 12.0–15.0)
MCH: 31.6 pg (ref 26.0–34.0)
MCHC: 32.3 g/dL (ref 30.0–36.0)
MCV: 98 fL (ref 80.0–100.0)
Platelets: 179 10*3/uL (ref 150–400)
RBC: 1.96 MIL/uL — ABNORMAL LOW (ref 3.87–5.11)
RDW: 18 % — ABNORMAL HIGH (ref 11.5–15.5)
WBC: 8.8 10*3/uL (ref 4.0–10.5)
nRBC: 0.2 % (ref 0.0–0.2)

## 2021-08-04 LAB — BASIC METABOLIC PANEL
Anion gap: 10 (ref 5–15)
BUN: 17 mg/dL (ref 8–23)
CO2: 23 mmol/L (ref 22–32)
Calcium: 8.1 mg/dL — ABNORMAL LOW (ref 8.9–10.3)
Chloride: 104 mmol/L (ref 98–111)
Creatinine, Ser: 0.93 mg/dL (ref 0.44–1.00)
GFR, Estimated: 55 mL/min — ABNORMAL LOW (ref >60)
Glucose, Bld: 154 mg/dL — ABNORMAL HIGH (ref 70–99)
Potassium: 4.5 mmol/L (ref 3.5–5.1)
Sodium: 137 mmol/L (ref 135–145)

## 2021-08-04 LAB — PREPARE RBC (CROSSMATCH)

## 2021-08-04 LAB — ABO/RH: ABO/RH(D): B POS

## 2021-08-04 MED ORDER — LIP MEDEX EX OINT
TOPICAL_OINTMENT | CUTANEOUS | Status: AC
  Administered 2021-08-04: 1
  Filled 2021-08-04: qty 7

## 2021-08-04 MED ORDER — SODIUM CHLORIDE 0.9% IV SOLUTION: Freq: Once | INTRAVENOUS | Status: AC

## 2021-08-04 MED ORDER — CHLORHEXIDINE GLUCONATE CLOTH 2 % EX PADS
6.0000 | MEDICATED_PAD | Freq: Every day | CUTANEOUS | Status: DC
  Administered 2021-08-04 – 2021-08-05 (×2): 6 via TOPICAL

## 2021-08-04 NOTE — Progress Notes (Signed)
Tara Nolan  WSF:[id number] DOB: 09/15/1919 DOA: 08/03/2021 PCP: [name], MD     Brief Narrative:  Tara Nolan is a 87 y.o. female with medical history significant for dementia, hypothyroidism, lumbar degenerative disc disease, recent COVID infection with positive test on 06/28/2021, who lives at a skilled nursing facility and is under hospice care (for weight loss and malnutrition), who presents to the emergency department on 08/03/21 with pain after fall.  Her fall was unwitnessed but occurred on 08/03/2021.  After the fall, the patient had pain in her left hip.  Imaging revealed left intertrochanteric hip fracture. She underwent cephalomedullary nail of left hip fracture on 9/8 by Dr. Susa Simmonds.   New events last 24 hours / Subjective: Patient very confused with her history of dementia.  Unable to gather any meaningful ROS.  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 (HCC)   Lumbar degenerative disc disease   Mechanical fall with closed inter trochanteric fracture of left femur -Status post surgical intervention 9/8 -PT OT  Acute blood loss anemia, postop -Transfuse 1 unit packed red blood cell today  Recent COVID infection -Out of window for isolation requirements  Dementia -Delirium precaution and supportive care  HTN -Hold norvasc due to blood loss and marginal BP   Hypothyroidism -Continue Synthroid   DVT prophylaxis:  SCDs Start: 08/03/21 1916 SCDs Start: 08/03/21 1323 Place and maintain sequential compression device Start: 08/03/21 1018  Code Status:     Code Status Orders  (From admission, onward)           Start     Ordered   08/03/21 1325  Do not attempt resuscitation (DNR)  Continuous       Question Answer Comment  In the  event of cardiac or respiratory ARREST Do not call a "code blue"   In the event of cardiac or respiratory ARREST Do not perform Intubation, CPR, defibrillation or ACLS   In the event of cardiac or respiratory ARREST Use medication by any route, position, wound care, and other measures to relive pain and suffering. May use oxygen, suction and manual treatment of airway obstruction as needed for comfort.      08/03/21 1325           Code Status History     Date Active Date Inactive Code Status Order ID Comments User Context   08/03/2021 1322 08/03/2021 1325 DNR [id number]  Marlow Baars, MD Inpatient   01/30/2020 1957 02/04/2020 1720 DNR [id number]  Hillary Bow, DO ED   01/30/2020 1909 01/30/2020 1957 DNR [id number]  Charlynne Pander, MD ED   11/22/2018 1724 11/27/2018 1738 DNR [id number]  Elease Etienne, MD Inpatient   09/08/2017 0322 09/12/2017 1930 DNR [id number]  Clydie Braun, MD ED   06/20/2012 1616 06/23/2012 1904 DNR [id number]  Deneise Lever, RN Inpatient      Advance Directive Documentation    Flowsheet Row Most Recent Value  Type of Advance Directive Out of facility DNR (pink MOST or yellow form), Living will  Pre-existing out of facility DNR order (yellow form or pink MOST form) Yellow form placed in chart (order not valid for inpatient use)  "MOST" Form in Place? --      Family Communication: No family at bedside Disposition Plan:  Status is: Inpatient  Remains inpatient appropriate because:Unsafe d/c plan  Dispo: The patient is from: SNF              Anticipated d/c is to: SNF              Patient currently is not medically stable to d/c.   Difficult to place patient No   Antimicrobials:  Anti-infectives (From admission, onward)    Start     Dose/Rate Route Frequency Ordered Stop   08/03/21 2100  ceFAZolin (ANCEF) IVPB 2g/100 mL premix        2 g 200 mL/hr over 30 Minutes Intravenous Every 6 hours 08/03/21 1915 08/04/21 0243   08/03/21 1245  ceFAZolin  (ANCEF) IVPB 2g/100 mL premix        2 g 200 mL/hr over 30 Minutes Intravenous On call to O.R. 08/03/21 1234 08/03/21 1507        Objective: Vitals:   08/04/21 0418 08/04/21 0419 08/04/21 0848 08/04/21 0955  BP:  (!) 110/50 112/63   Pulse:  95 90   Resp:  16    Temp:  98.2 F (36.8 C)  98.3 F (36.8 C)  TempSrc:  Axillary  Oral  SpO2:  100%    Weight: 38.6 kg     Height:        Intake/Output Summary (Last 24 hours) at 08/04/2021 1259 Last data filed at 08/04/2021 0607 Gross per 24 hour  Intake 3041.79 ml  Output 295 ml  Net 2746.79 ml   Filed Weights   08/03/21 0852 08/04/21 0418  Weight: 38.6 kg 38.6 kg    Examination:  General exam: Appears calm and comfortable  Respiratory system: Clear to auscultation. Respiratory effort normal. No respiratory distress. No conversational dyspnea.  Cardiovascular system: S1 & S2 heard, RRR. No murmurs. No pedal edema. Gastrointestinal system: Abdomen is nondistended, soft and nontender. Normal bowel sounds heard. Central nervous system: Alert  Extremities: Symmetric in appearance  Skin: No rashes, lesions or ulcers on exposed skin  Psychiatry: Confused, history of dementia   Data Reviewed: I have personally reviewed following labs and imaging studies  CBC: Recent Labs  Lab 08/03/21 1001 08/04/21 0907  WBC 8.5 8.8  NEUTROABS 6.6  --   HGB 11.0* 6.2*  HCT 33.2* 19.2*  MCV 96.8 98.0  PLT 284 179   Basic Metabolic Panel: Recent Labs  Lab 08/03/21 1001 08/04/21 0313  NA 138 137  K 3.5 4.5  CL 106 104  CO2 24 23  GLUCOSE 109* 154*  BUN 14 17  CREATININE 0.66 0.93  CALCIUM 8.6* 8.1*   GFR: Estimated Creatinine Clearance: 20.1 mL/min (by C-G formula based on SCr of 0.93 mg/dL). Liver Function Tests: Recent Labs  Lab 08/03/21 1001  AST 22  ALT 14  ALKPHOS 64  BILITOT 0.5  PROT 7.0  ALBUMIN 3.7   No results for input(s): LIPASE, AMYLASE in the last 168 hours. No results for input(s): AMMONIA in the last 168  hours. Coagulation Profile: Recent Labs  Lab 08/03/21 1001  INR 1.2   Cardiac Enzymes: No results for input(s): CKTOTAL, CKMB, CKMBINDEX, TROPONINI in the last 168 hours. BNP (last 3 results) No results for input(s): PROBNP in the last 8760 hours. HbA1C: No results for input(s): HGBA1C in the last 72 hours. CBG: No results for input(s): GLUCAP in the last 168 hours. Lipid Profile: No results for input(s): CHOL, HDL, LDLCALC, TRIG, CHOLHDL, LDLDIRECT in the last 72 hours. Thyroid Function Tests: No results  for input(s): TSH, T4TOTAL, FREET4, T3FREE, THYROIDAB in the last 72 hours. Anemia Panel: No results for input(s): VITAMINB12, FOLATE, FERRITIN, TIBC, IRON, RETICCTPCT in the last 72 hours. Sepsis Labs: No results for input(s): PROCALCITON, LATICACIDVEN in the last 168 hours.  Recent Results (from the past 240 hour(s))  Resp Panel by RT-PCR (Flu A&B, Covid) Nasopharyngeal Swab     Status: Abnormal   Collection Time: 08/03/21 10:01 AM   Specimen: Nasopharyngeal Swab; Nasopharyngeal(NP) swabs in vial transport medium  Result Value Ref Range Status   SARS Coronavirus 2 by RT PCR POSITIVE (A) NEGATIVE Final    Comment: RESULTS CONFIRMED BY MANUAL DILUTION RESULT CALLED TO, READ BACK BY AND VERIFIED WITH: HALL J. RN ON 08/03/2021 @ 1144 BY MECIAL J (NOTE) SARS-CoV-2 target nucleic acids are DETECTED.  The SARS-CoV-2 RNA is generally detectable in upper respiratory specimens during the acute phase of infection. Positive results are indicative of the presence of the identified virus, but do not rule out bacterial infection or co-infection with other pathogens not detected by the test. Clinical correlation with patient history and other diagnostic information is necessary to determine patient infection status. The expected result is Negative.  Fact Sheet for Patients: BloggerCourse.comhttps://www.fda.gov/media/152166/download  Fact Sheet for Healthcare  Providers: SeriousBroker.ithttps://www.fda.gov/media/152162/download  This test is not yet approved or cleared by the [place] FDA and  has been authorized for detection and/or diagnosis of SARS-CoV-2 by FDA under an Emergency Use Authorization (EUA).  This EUA  will remain in effect (meaning this test can be used) for the duration of  the COVID-19 declaration under Section 564(b)(1) of the Act, 21 U.S.C. section 360bbb-3(b)(1), unless the authorization is terminated or revoked sooner.     Influenza A by PCR NEGATIVE NEGATIVE Final   Influenza B by PCR NEGATIVE NEGATIVE Final    Comment: (NOTE) The Xpert Xpress SARS-CoV-2/FLU/RSV plus assay is intended as an aid in the diagnosis of influenza from Nasopharyngeal swab specimens and should not be used as a sole basis for treatment. Nasal washings and aspirates are unacceptable for Xpert Xpress SARS-CoV-2/FLU/RSV testing.  Fact Sheet for Patients: BloggerCourse.comhttps://www.fda.gov/media/152166/download  Fact Sheet for Healthcare Providers: SeriousBroker.ithttps://www.fda.gov/media/152162/download  This test is not yet approved or cleared by the [place] FDA and has been authorized for detection and/or diagnosis of SARS-CoV-2 by FDA under an Emergency Use Authorization (EUA). This EUA will remain in effect (meaning this test can be used) for the duration of the COVID-19 declaration under Section 564(b)(1) of the Act, 21 U.S.C. section 360bbb-3(b)(1), unless the authorization is terminated or revoked.  Performed at [facility], [address] [place], [place] [address]       Radiology Studies: CT HEAD WO CONTRAST (5MM)  Result Date: 08/03/2021 CLINICAL DATA:  Unwitnessed fall, found on floor EXAM: CT HEAD WITHOUT CONTRAST CT CERVICAL SPINE WITHOUT CONTRAST TECHNIQUE: Multidetector CT imaging of the head and cervical spine was performed following the standard protocol without intravenous contrast. Multiplanar CT image reconstructions of the  cervical spine were also generated. COMPARISON:  08/21/2020 FINDINGS: CT HEAD FINDINGS Brain: No evidence of acute infarction, hemorrhage, hydrocephalus, extra-axial collection or mass lesion/mass effect. Periventricular and deep white matter hypodensity. Mild global cerebral volume loss. Vascular: No hyperdense vessel or unexpected calcification. Skull: Normal. Negative for fracture or focal lesion. Sinuses/Orbits: No acute finding. Other: None. CT CERVICAL SPINE FINDINGS Alignment: Normal. Skull base and vertebrae: No acute fracture. No primary bone lesion or focal pathologic process. Soft tissues and spinal canal: No prevertebral fluid  or swelling. No visible canal hematoma. Disc levels: Moderate multilevel disc space height loss and osteophytosis. Upper chest: Negative. Other: None. IMPRESSION: 1. No acute intracranial pathology. Small-vessel white matter disease and mild global cerebral volume loss in keeping with advanced patient age. 2. No fracture or static subluxation of the cervical spine. 3. Moderate multilevel cervical disc degenerative disease. Electronically Signed   By: Lauralyn Primes M.D.   On: 08/03/2021 11:01   CT Cervical Spine Wo Contrast  Result Date: 08/03/2021 CLINICAL DATA:  Unwitnessed fall, found on floor EXAM: CT HEAD WITHOUT CONTRAST CT CERVICAL SPINE WITHOUT CONTRAST TECHNIQUE: Multidetector CT imaging of the head and cervical spine was performed following the standard protocol without intravenous contrast. Multiplanar CT image reconstructions of the cervical spine were also generated. COMPARISON:  08/21/2020 FINDINGS: CT HEAD FINDINGS Brain: No evidence of acute infarction, hemorrhage, hydrocephalus, extra-axial collection or mass lesion/mass effect. Periventricular and deep white matter hypodensity. Mild global cerebral volume loss. Vascular: No hyperdense vessel or unexpected calcification. Skull: Normal. Negative for fracture or focal lesion. Sinuses/Orbits: No acute finding. Other:  None. CT CERVICAL SPINE FINDINGS Alignment: Normal. Skull base and vertebrae: No acute fracture. No primary bone lesion or focal pathologic process. Soft tissues and spinal canal: No prevertebral fluid or swelling. No visible canal hematoma. Disc levels: Moderate multilevel disc space height loss and osteophytosis. Upper chest: Negative. Other: None. IMPRESSION: 1. No acute intracranial pathology. Small-vessel white matter disease and mild global cerebral volume loss in keeping with advanced patient age. 2. No fracture or static subluxation of the cervical spine. 3. Moderate multilevel cervical disc degenerative disease. Electronically Signed   By: Lauralyn Primes M.D.   On: 08/03/2021 11:01   DG Chest Portable 1 View  Result Date: 08/03/2021 CLINICAL DATA:  Larey Seat. Left hip fracture. EXAM: PORTABLE CHEST 1 VIEW COMPARISON:  Chest x-ray 08/21/2020 FINDINGS: The cardiac silhouette, mediastinal and hilar contours are within normal limits given the patient's age, AP projection and portable technique. There is moderate tortuosity and calcification of the thoracic aorta. Chronic pulmonary scarring changes but no acute pulmonary findings. The bony thorax is grossly intact. IMPRESSION: Chronic lung changes but no acute pulmonary findings. Electronically Signed   By: Rudie Meyer M.D.   On: 08/03/2021 09:55   DG C-Arm 1-60 Min-No Report  Result Date: 08/03/2021 Fluoroscopy was utilized by the requesting physician.  No radiographic interpretation.   DG HIP OPERATIVE UNILAT W OR W/O PELVIS LEFT  Result Date: 08/03/2021 CLINICAL DATA:  Intraoperative images for placement of intramedullary nail. EXAM: OPERATIVE LEFT HIP (WITH PELVIS IF PERFORMED) 14 VIEWS TECHNIQUE: Fluoroscopic spot image(s) were submitted for interpretation post-operatively. COMPARISON:  Preoperative evaluation August 03, 2021. FINDINGS: Intraoperative fluoroscopic spot views demonstrate changes of ORIF of a LEFT femoral fracture that was seen on  previous imaging with cephalo medullary nail placement and placement of screw across the femoral neck. Initial images again show the fracture and bit for reaming of the medullary canal. Placement of the femoral component with proximal screw crossing the femoral neck without apparent complicating features. Distal interlocking screw in place on final images. Near anatomic alignment with some distraction of the lesser trochanter. FLUOROSCOPY TIME:  43 seconds IMPRESSION: Intraoperative fluoroscopic views provided for placement of intramedullary device and proximal cephalomedullary and distal interlocking screws about a LEFT intertrochanteric fracture. No immediate complications. Electronically Signed   By: Donzetta Kohut M.D.   On: 08/03/2021 16:28   DG Hip Unilat W or Wo Pelvis 2-3 Views Left  Result  Date: 08/03/2021 CLINICAL DATA:  Larey Seat. Left hip pain. EXAM: DG HIP (WITH OR WITHOUT PELVIS) 2-3V LEFT COMPARISON:  08/21/2020 FINDINGS: There is a displaced intertrochanteric fracture of the left hip with a severe varus deformity. The hips are normally located. The bony pelvis is grossly intact. IMPRESSION: Displaced intertrochanteric fracture of the left hip. Electronically Signed   By: Rudie Meyer M.D.   On: 08/03/2021 09:56      Scheduled Meds:  sodium chloride   Intravenous Once   amLODipine  5 mg Oral Daily   aspirin EC  325 mg Oral Q breakfast   Chlorhexidine Gluconate Cloth  6 each Topical Daily   docusate sodium  100 mg Oral BID   levothyroxine  50 mcg Oral Q0600   mirtazapine  7.5 mg Oral QHS   psyllium  1 packet Oral Daily   traMADol  50 mg Oral BID   traZODone  50 mg Oral QHS   Continuous Infusions:  lactated ringers 100 mL/hr at 08/04/21 0607     LOS: 1 day      Time spent: 25 minutes   Noralee Stain, DO [facility] 08/04/2021, 12:59 PM   Available via Epic secure chat 7am-7pm After these hours, please refer to coverage provider listed on [email or web address]

## 2021-08-04 NOTE — Progress Notes (Signed)
Provider Blout notified via Amion of pt low urine output. Pt had only had a total of 125 emptied from foley since returning to the floor from pacu at 1835. No signs of bladder distention. Rn will continue to monitor.

## 2021-08-04 NOTE — [place] FL2 (Addendum)
[place] MEDICAID FL2 LEVEL OF CARE SCREENING TOOL     IDENTIFICATION  Patient Name: Tara Nolan Birthdate: 1919-07-02 Sex: female Admission Date (Current Location): 08/03/2021  [facility] and [place] Number:  Producer, television/film/video and Address:  [facility],  [address] 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 (HCC) 08/03/2021   Lumbar degenerative disc disease 08/03/2021   Adverse reaction to vaccine, initial encounter 01/30/2020   Compression fracture of lumbar vertebra (HCC) 01/30/2020   Syncope and collapse 11/22/2018   Back pain 11/22/2018   Hypothyroid 11/22/2018   History of CVA (cerebrovascular accident) 09/12/2017   Fall 09/08/2017   Right shoulder pain 09/08/2017   Left wrist fracture 09/08/2017   History of thyroid disease 09/08/2017   Chest discomfort 09/08/2017   Hip pain, acute 06/20/2012   Knee pain 06/20/2012   Hamstring tendon rupture 06/20/2012    Orientation RESPIRATION BLADDER Height & Weight     Self, Place  Normal Incontinent, Indwelling catheter Weight: 86 lb 13.8 oz (39.4 kg) Height:  5\' 5"  (165.1 cm)  BEHAVIORAL SYMPTOMS/MOOD NEUROLOGICAL BOWEL NUTRITION STATUS      Incontinent    AMBULATORY STATUS COMMUNICATION OF NEEDS Skin   Total Care Verbally Surgical wounds                       Personal Care Assistance Level of Assistance  Bathing,  Feeding Bathing Assistance: Limited assistance Feeding assistance: Limited assistance       Functional Limitations Info             SPECIAL CARE FACTORS FREQUENCY                       Contractures Contractures Info: Not present    Additional Factors Info  Code Status, Allergies Code Status Info: DNR Allergies Info: NKDA           Current Medications (08/07/2021):  This is the current hospital active medication list Current Facility-Administered Medications  Medication Dose Route Frequency Provider Last Rate Last Admin   acetaminophen (TYLENOL) tablet 650 mg  650 mg Oral Q6H PRN 10/07/2021, MD   650 mg at 08/07/21 0554   Or   acetaminophen (TYLENOL) suppository 650 mg  650 mg Rectal Q6H PRN 10/07/21, MD       aspirin EC tablet 325 mg  325 mg Oral Q breakfast Terance Hart, MD   325 mg at 08/07/21 10/07/21   bisacodyl (DULCOLAX) EC tablet 5 mg  5 mg Oral Daily PRN 6222, MD   5 mg at 08/06/21 0956   Chlorhexidine Gluconate Cloth 2 % PADS 6 each  6 each Topical Daily 10/06/21, DO   6 each at 08/05/21 1010   docusate (COLACE) 50 MG/5ML liquid 100 mg  100 mg Oral BID 10/05/21, [facility]  levothyroxine (SYNTHROID) tablet 50 mcg  50 mcg Oral Q0600 Marlow Baars, MD   50 mcg at 08/07/21 0554   LORazepam (ATIVAN) tablet 0.25 mg  0.25 mg Oral Q6H PRN Marlow Baars, MD       menthol-cetylpyridinium (CEPACOL) lozenge 3 mg  1 lozenge Oral PRN Terance Hart, MD       Or   phenol (CHLORASEPTIC) mouth spray 1 spray  1 spray Mouth/Throat PRN Terance Hart, MD       metoCLOPramide (REGLAN) tablet 5-10 mg  5-10 mg Oral Q8H PRN Terance Hart, MD       Or   metoCLOPramide (REGLAN) injection 5-10 mg  5-10 mg Intravenous Q8H PRN Terance Hart, MD       metoprolol tartrate (LOPRESSOR) injection 5 mg  5 mg Intravenous Q6H PRN Terance Hart, MD       mirtazapine (REMERON) tablet 7.5 mg  7.5 mg Oral QHS  Marlow Baars, MD   7.5 mg at 08/06/21 2226   ondansetron (ZOFRAN) tablet 4 mg  4 mg Oral Q6H PRN Terance Hart, MD       Or   ondansetron [facility]) injection 4 mg  4 mg Intravenous Q6H PRN Terance Hart, MD       polyvinyl alcohol (LIQUIFILM TEARS) 1.4 % ophthalmic solution 2 drop  2 drop Both Eyes Q2H PRN Marlow Baars, MD       psyllium (HYDROCIL/METAMUCIL) 1 packet  1 packet Oral Daily Marlow Baars, MD   1 packet at 08/07/21 0936   senna-docusate (Senokot-S) tablet 1 tablet  1 tablet Oral QHS PRN Terance Hart, MD         Discharge Medications: Please see discharge summary for a list of discharge medications.  Relevant Imaging Results:  Relevant Lab Results:   Additional Information SS#: [id number]  Amada Jupiter, LCSW

## 2021-08-04 NOTE — Progress Notes (Signed)
CRITICAL VALUE STICKER  CRITICAL VALUE: Hgb 6.5  RECEIVER (on-site recipient of call): Zerita Boers RN  DATE & TIME NOTIFIED: 08-04-21 0400  MESSENGER (representative from lab): Lab tech  MD NOTIFIED: Audrea Muscat  TIME OF NOTIFICATION: 0405  RESPONSE: Lab will redraw and the provider will continue to monitor.

## 2021-08-04 NOTE — TOC Initial Note (Addendum)
Transition of Care [facility]) - Initial/Assessment Note    Patient Details  Name: Tara Nolan MRN: [id number] Date of Birth: 06-Nov-1919  Transition [facility]) CM/SW Contact:    Lennart Pall, LCSW Phone Number: 08/04/2021, 2:24 PM  Clinical Narrative:                 Met with pt and son, [name], today to discuss dc plans.  Both are hopeful pt will be cleared to return to [facility].  Facility currently reviewing documentation and will alert with decision today.  If pt is cleared for return, they would not be able to readmit her until Monday (MD aware).  Of note, pt is under Hospice care and transport back or to another facility will need to be with [facility].  Continue to follow.  Expected Discharge Plan: Assisted Living Barriers to Discharge: Continued Medical Work up   Patient Goals and CMS Choice Patient states their goals for this hospitalization and ongoing recovery are:: son hopeful pt will be cleared to return to [employer]      Expected Discharge Plan and Services Expected Discharge Plan: Assisted Living In-house Referral: Clinical Social Work     Living arrangements for the past 2 months: [place]                 DME Arranged: N/A DME Agency: NA                  Prior Living Arrangements/Services Living arrangements for the past 2 months: [place] Lives with:: Facility Resident Patient language and need for interpreter reviewed:: Yes Do you feel safe going back to the place where you live?: Yes      Need for Family Participation in Patient Care: No (Comment) Care giver support system in place?: Yes (comment)   Criminal Activity/Legal Involvement Pertinent to Current Situation/Hospitalization: No - Comment as needed  Activities of Daily Living Home Assistive Devices/Equipment: Blood pressure cuff, Wheelchair, Grab bars in shower, Grab bars around toilet, Hand-held shower hose, Scales, Eyeglasses ADL Screening (condition  at time of admission) Patient's cognitive ability adequate to safely complete daily activities?: No Is the patient deaf or have difficulty hearing?: Yes Does the patient have difficulty seeing, even when wearing glasses/contacts?: Yes Does the patient have difficulty concentrating, remembering, or making decisions?: Yes Patient able to express need for assistance with ADLs?: Yes Does the patient have difficulty dressing or bathing?: Yes Independently performs ADLs?: No Communication: Independent Dressing (OT): Dependent Is this a change from baseline?: Pre-admission baseline Grooming: Dependent Is this a change from baseline?: Pre-admission baseline Feeding: Dependent Is this a change from baseline?: Pre-admission baseline Bathing: Dependent Is this a change from baseline?: Pre-admission baseline Toileting: Dependent Is this a change from baseline?: Pre-admission baseline In/Out Bed: Dependent Is this a change from baseline?: Pre-admission baseline Walks in Home: Dependent (wheel chair bound) Is this a change from baseline?: Pre-admission baseline Does the patient have difficulty walking or climbing stairs?: Yes Weakness of Legs: Both Weakness of Arms/Hands: Both  Permission Sought/Granted Permission sought to share information with : Family Supports, Chartered certified accountant granted to share information with : Yes, Verbal Permission Granted  Share Information with NAME: Lovene Maret     Permission granted to share info w Relationship: son  Permission granted to share info w Contact Information: [phone]  Emotional Assessment Appearance:: Appears stated age Attitude/Demeanor/Rapport: Gracious Affect (typically observed): Pleasant Orientation: : Oriented to Self, Oriented to Place Alcohol /  Substance Use: Not Applicable Psych Involvement: No (comment)  Admission diagnosis:  Closed intertrochanteric fracture of left femur, initial encounter [facility])  [S72.142A] Patient Active Problem List   Diagnosis Date Noted   Closed intertrochanteric fracture of left femur, initial encounter ([place]) 08/03/2021   Dementia of the Alzheimer's type with late onset without behavioral disturbance ([place]) 08/03/2021   Lumbar degenerative disc disease 08/03/2021   Adverse reaction to vaccine, initial encounter 01/30/2020   Compression fracture of lumbar vertebra ([place]) 01/30/2020   Syncope and collapse 11/22/2018   Back pain 11/22/2018   Hypothyroid 11/22/2018   History of CVA (cerebrovascular accident) 09/12/2017   Fall 09/08/2017   Right shoulder pain 09/08/2017   Left wrist fracture 09/08/2017   History of thyroid disease 09/08/2017   Chest discomfort 09/08/2017   Hip pain, acute 06/20/2012   Knee pain 06/20/2012   Hamstring tendon rupture 06/20/2012   PCP:  Chesley Noon, MD Pharmacy:   [id number] - [place], [place] [address] [place] [address] Phone: [phone] Fax: [phone]     Social Determinants of Health (SDOH) Interventions    Readmission Risk Interventions No flowsheet data found.

## 2021-08-04 NOTE — Progress Notes (Signed)
PT Cancellation Note  Patient Details Name: Tara Nolan MRN: [id number] DOB: 1919-08-09   Cancelled Treatment:    Reason Eval/Treat Not Completed: Medical issues which prohibited therapy Pt with Hgb 6.3 and to get PRBCs.   Kati L Payson 08/04/2021, 10:53 AM Thomasene Mohair PT, DPT Acute Rehabilitation Services Pager: [phone] Office: [phone]

## 2021-08-04 NOTE — Plan of Care (Signed)
  Problem: Activity: Goal: Risk for activity intolerance will decrease Outcome: Progressing   Problem: Pain Managment: Goal: General experience of comfort will improve Outcome: Progressing   Problem: Safety: Goal: Ability to remain free from injury will improve Outcome: Progressing   

## 2021-08-04 NOTE — Progress Notes (Addendum)
[place] [place] [facility] [address] [facility]) Hospitalized Hospice Patient    Tara Nolan is a current hospice patient with a terminal diagnosis of cerebrovascular disease. [name] was found in her room by staff screaming c/o hip pain. She was transported to [facility] ED for evaluation of her hip pain. She is admitted with a left intertrochanteric hip fracture. This is a related hospital admission per Dr. Eulas Post with [place].   Report exchanged, met with patient at the bedside, son Tara Nolan present in the room. Patient is more confused than her baseline. She is alert to self, pleasantly talkative and confused. She appears comfortable.   V/S: 98.7 oral, 103/55, HR 85, RR 18, SPO2 100% on O2 @ 2 lpm I&O: 1191/165 Labs: RBC 1.96, hgb 6.2, HCT 19.2 IV/PRN: ancef 2 g IV q6h, LR continuous @ 100 ml/hr, morphine 1 mg IV x 2  Problem List: - left intertrochanteric fracture - nailing of left hip on 9/8, patient is comfortable - acute blood loss anemia - hgb dropped from 11>6.2, minimal blood loss during surgery, no signs of overt bleeding, plans to transfuse 1 unit and re-check CBC  She is inpatient appropriate as her pain and hip fracture needed to be managed operatively as well as transfusion of blood due to acute blood loss anemia.   GOC: Relief of pain from hip fracture. At baseline, she is mostly wheelchair bound but was able to transfer herself.  D/C planning: from [place]. Nanine Means is advising they cannot care for her with a surgical wound. Not sure if she will be able to return or not. PT unable to work with her today due to a drop in her hgb.  Family: son is [employer] and Media planner IDT: updated  Transportation for our current hospice patients is provided by [employer], as they contract this service for our active hospice patients.  Venia Carbon RN, BSN, [facility] Liaison

## 2021-08-04 NOTE — Progress Notes (Signed)
     Tara Nolan is a 87 y.o. female   Orthopaedic diagnosis:status post left hip cephalo-medullary nail for intertrochanteric hip fracture on 08/03/2021  Subjective: Patient is resting comfortably.  Her son indicates that she is not noting any pain.  She is to receive PRBCs.  Objectyive: Vitals:   08/04/21 0848 08/04/21 0955  BP: 112/63   Pulse: 90   Resp:    Temp:  98.3 F (36.8 C)  SpO2:       Exam: Awake and alert Respirations even and unlabored No acute distress  Left hip dressing in place.  No significant shadowing noted.  Assessment: Postop day 1 status post cephalo-medullary nail for left intertrochanteric hip fracture   Plan: Patient has low hemoglobin likely due to acute blood loss anemia from her hip fracture.  Intraoperative blood loss was relatively minimal.  She is to receive PRBCs per hospitalist team.  An orthopedic standpoint she is doing well.  Her pain is minimal.  She will work with physical therapy and is suitable for discharge from an orthopedic standpoint once medically stable.  Limit narcotics. We will discharge her on 325 aspirin daily for DVT prophylaxis as long as her hemoglobin stabilizes.   Nicki Guadal[place], MD

## 2021-08-05 DIAGNOSIS — S72142A Displaced intertrochanteric fracture of left femur, initial encounter for closed fracture: Secondary | ICD-10-CM | POA: Diagnosis not present

## 2021-08-05 LAB — CBC
HCT: 21.8 % — ABNORMAL LOW (ref 36.0–46.0)
Hemoglobin: 7.3 g/dL — ABNORMAL LOW (ref 12.0–15.0)
MCH: 30.4 pg (ref 26.0–34.0)
MCHC: 33.5 g/dL (ref 30.0–36.0)
MCV: 90.8 fL (ref 80.0–100.0)
Platelets: 143 10*3/uL — ABNORMAL LOW (ref 150–400)
RBC: 2.4 MIL/uL — ABNORMAL LOW (ref 3.87–5.11)
RDW: 19.6 % — ABNORMAL HIGH (ref 11.5–15.5)
WBC: 7.1 10*3/uL (ref 4.0–10.5)
nRBC: 0.6 % — ABNORMAL HIGH (ref 0.0–0.2)

## 2021-08-05 NOTE — Progress Notes (Signed)
PROGRESS NOTE    Tara Nolan  ENI:[id number] DOB: 05/03/1919 DOA: 08/03/2021 PCP: [name], MD     Brief Narrative:  Tara Nolan is a 87 y.o. female with medical history significant for dementia, hypothyroidism, lumbar degenerative disc disease, recent COVID infection with positive test on 06/28/2021, who lives at a skilled nursing facility and is under hospice care (for weight loss and malnutrition), who presents to the emergency department on 08/03/21 with pain after fall.  Her fall was unwitnessed but occurred on 08/03/2021.  After the fall, the patient had pain in her left hip.  Imaging revealed left intertrochanteric hip fracture. She underwent cephalomedullary nail of left hip fracture on 9/8 by Dr. Susa Simmonds.   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  Continue to monitor hemoglobin  Recent COVID infection -Out of window for isolation requirements  Dementia -Delirium precaution and supportive care  HTN -Hold norvasc due to blood loss and marginal BP   Hypothyroidism -Continue Synthroid   DVT prophylaxis:  SCDs Start: 08/03/21 1916 SCDs Start: 08/03/21 1323 Place and maintain sequential compression device Start: 08/03/21 1018  Code Status:     Code Status Orders  (From admission, onward)           Start     Ordered   08/03/21 1325  Do not attempt resuscitation (DNR)   Continuous       Question Answer Comment  In the event of cardiac or respiratory ARREST Do not call a "code blue"   In the event of cardiac or respiratory ARREST Do not perform Intubation, CPR, defibrillation or ACLS   In the event of cardiac or respiratory ARREST Use medication by any route, position, wound care, and other measures to relive pain and suffering. May use oxygen, suction and manual treatment of airway obstruction as needed for comfort.      08/03/21 1325           Code Status History     Date Active Date Inactive Code Status Order ID Comments User Context   08/03/2021 1322 08/03/2021 1325 DNR [id number]  Marlow Baars, MD Inpatient   01/30/2020 1957 02/04/2020 1720 DNR [id number]  Hillary Bow, DO ED   01/30/2020 1909 01/30/2020 1957 DNR [id number]  Charlynne Pander, MD ED   11/22/2018 1724 11/27/2018 1738 DNR [id number]  Elease Etienne, MD Inpatient   09/08/2017 0322 09/12/2017 1930 DNR [id number]  Clydie Braun, MD ED   06/20/2012 1616 06/23/2012 1904 DNR [id number]  Deneise Lever, RN Inpatient      Advance Directive Documentation    Flowsheet Row Most Recent Value  Type of Advance Directive Out of facility DNR (pink MOST or yellow form), Living will  Pre-existing out of facility DNR order (yellow form or pink MOST form) Yellow form placed in chart (order not valid for inpatient use)  "MOST" Form in Place? --  Family Communication: 2 family members at bedside Disposition Plan:  Status is: Inpatient  Remains inpatient appropriate because:Unsafe d/c plan  Dispo: The patient is from: ALF/hospice              Anticipated d/c is to: ALF/hospice              Patient currently is medically stable to d/c.  Awaiting placement, hopefully can figure this out on Monday if her previous ALF will be able to take her back.  She is a poor SNF candidate as she is not rehabable and would lose hospice benefits if she were to be placed in SNF.   Difficult to place patient  No   Antimicrobials:  Anti-infectives (From admission, onward)    Start     Dose/Rate Route Frequency Ordered Stop   08/03/21 2100  ceFAZolin (ANCEF) IVPB 2g/100 mL premix        2 g 200 mL/hr over 30 Minutes Intravenous Every 6 hours 08/03/21 1915 08/04/21 0243   08/03/21 1245  ceFAZolin (ANCEF) IVPB 2g/100 mL premix        2 g 200 mL/hr over 30 Minutes Intravenous On call to O.R. 08/03/21 1234 08/03/21 1507        Objective: Vitals:   08/04/21 1823 08/04/21 1951 08/05/21 0238 08/05/21 0245  BP: 134/62 125/67 139/66   Pulse: 97 85 87   Resp:  20 16   Temp: 99.3 F (37.4 C) 98.5 F (36.9 C) 98.2 F (36.8 C)   TempSrc: Oral Oral Oral   SpO2: 97% 96% 94%   Weight:    41 kg  Height:        Intake/Output Summary (Last 24 hours) at 08/05/2021 1120 Last data filed at 08/05/2021 0600 Gross per 24 hour  Intake 1845.71 ml  Output 1000 ml  Net 845.71 ml    Filed Weights   08/03/21 0852 08/04/21 0418 08/05/21 0245  Weight: 38.6 kg 38.6 kg 41 kg    Examination: General exam: Appears calm and comfortable  Extremities: Left hip with clean dressing in place, soft   Data Reviewed: I have personally reviewed following labs and imaging studies  CBC: Recent Labs  Lab 08/03/21 1001 08/04/21 0907 08/05/21 0318  WBC 8.5 8.8 7.1  NEUTROABS 6.6  --   --   HGB 11.0* 6.2* 7.3*  HCT 33.2* 19.2* 21.8*  MCV 96.8 98.0 90.8  PLT 284 179 143*    Basic Metabolic Panel: Recent Labs  Lab 08/03/21 1001 08/04/21 0313  NA 138 137  K 3.5 4.5  CL 106 104  CO2 24 23  GLUCOSE 109* 154*  BUN 14 17  CREATININE 0.66 0.93  CALCIUM 8.6* 8.1*    GFR: Estimated Creatinine Clearance: 21.3 mL/min (by C-G formula based on SCr of 0.93 mg/dL). Liver Function Tests: Recent Labs  Lab 08/03/21 1001  AST 22  ALT 14  ALKPHOS 64  BILITOT 0.5  PROT 7.0  ALBUMIN 3.7    No results for input(s): LIPASE, AMYLASE in the last 168 hours. No results for input(s): AMMONIA in the last 168  hours. Coagulation Profile: Recent Labs  Lab 08/03/21 1001  INR 1.2    Cardiac Enzymes: No results for input(s): CKTOTAL, CKMB, CKMBINDEX, TROPONINI in the last 168 hours. BNP (last 3 results) No results for input(s): PROBNP in the last 8760 hours. HbA1C: No results for input(s): HGBA1C in the last 72 hours. CBG: No results for input(s): GLUCAP in the last 168 hours. Lipid Profile:  No results for input(s): CHOL, HDL, LDLCALC, TRIG, CHOLHDL, LDLDIRECT in the last 72 hours. Thyroid Function Tests: No results for input(s): TSH, T4TOTAL, FREET4, T3FREE, THYROIDAB in the last 72 hours. Anemia Panel: No results for input(s): VITAMINB12, FOLATE, FERRITIN, TIBC, IRON, RETICCTPCT in the last 72 hours. Sepsis Labs: No results for input(s): PROCALCITON, LATICACIDVEN in the last 168 hours.  Recent Results (from the past 240 hour(s))  Resp Panel by RT-PCR (Flu A&B, Covid) Nasopharyngeal Swab     Status: Abnormal   Collection Time: 08/03/21 10:01 AM   Specimen: Nasopharyngeal Swab; Nasopharyngeal(NP) swabs in vial transport medium  Result Value Ref Range Status   SARS Coronavirus 2 by RT PCR POSITIVE (A) NEGATIVE Final    Comment: RESULTS CONFIRMED BY MANUAL DILUTION RESULT CALLED TO, READ BACK BY AND VERIFIED WITH: HALL J. RN ON 08/03/2021 @ 1144 BY MECIAL J (NOTE) SARS-CoV-2 target nucleic acids are DETECTED.  The SARS-CoV-2 RNA is generally detectable in upper respiratory specimens during the acute phase of infection. Positive results are indicative of the presence of the identified virus, but do not rule out bacterial infection or co-infection with other pathogens not detected by the test. Clinical correlation with patient history and other diagnostic information is necessary to determine patient infection status. The expected result is Negative.  Fact Sheet for Patients: BloggerCourse.com  Fact Sheet for Healthcare  Providers: SeriousBroker.it  This test is not yet approved or cleared by the [place] FDA and  has been authorized for detection and/or diagnosis of SARS-CoV-2 by FDA under an Emergency Use Authorization (EUA).  This EUA  will remain in effect (meaning this test can be used) for the duration of  the COVID-19 declaration under Section 564(b)(1) of the Act, 21 U.S.C. section 360bbb-3(b)(1), unless the authorization is terminated or revoked sooner.     Influenza A by PCR NEGATIVE NEGATIVE Final   Influenza B by PCR NEGATIVE NEGATIVE Final    Comment: (NOTE) The Xpert Xpress SARS-CoV-2/FLU/RSV plus assay is intended as an aid in the diagnosis of influenza from Nasopharyngeal swab specimens and should not be used as a sole basis for treatment. Nasal washings and aspirates are unacceptable for Xpert Xpress SARS-CoV-2/FLU/RSV testing.  Fact Sheet for Patients: BloggerCourse.com  Fact Sheet for Healthcare Providers: SeriousBroker.it  This test is not yet approved or cleared by the [place] FDA and has been authorized for detection and/or diagnosis of SARS-CoV-2 by FDA under an Emergency Use Authorization (EUA). This EUA will remain in effect (meaning this test can be used) for the duration of the COVID-19 declaration under Section 564(b)(1) of the Act, 21 U.S.C. section 360bbb-3(b)(1), unless the authorization is terminated or revoked.  Performed at [facility], [address] [place], [place] [address]        Radiology Studies: DG C-Arm 1-60 Min-No Report  Result Date: 08/03/2021 Fluoroscopy was utilized by the requesting physician.  No radiographic interpretation.   DG HIP OPERATIVE UNILAT W OR W/O PELVIS LEFT  Result Date: 08/03/2021 CLINICAL DATA:  Intraoperative images for placement of intramedullary nail. EXAM: OPERATIVE LEFT HIP (WITH PELVIS IF PERFORMED) 14 VIEWS  TECHNIQUE: Fluoroscopic spot image(s) were submitted for interpretation post-operatively. COMPARISON:  Preoperative evaluation August 03, 2021. FINDINGS: Intraoperative fluoroscopic spot views demonstrate changes of ORIF of a LEFT femoral fracture that was seen on previous imaging with cephalo medullary nail placement and placement of screw across the femoral neck. Initial images again show the fracture and bit for reaming of the medullary canal.  Placement of the femoral component with proximal screw crossing the femoral neck without apparent complicating features. Distal interlocking screw in place on final images. Near anatomic alignment with some distraction of the lesser trochanter. FLUOROSCOPY TIME:  43 seconds IMPRESSION: Intraoperative fluoroscopic views provided for placement of intramedullary device and proximal cephalomedullary and distal interlocking screws about a LEFT intertrochanteric fracture. No immediate complications. Electronically Signed   By: Donzetta KohutGeoffrey  Wile M.D.   On: 08/03/2021 16:28      Scheduled Meds:  aspirin EC  325 mg Oral Q breakfast   Chlorhexidine Gluconate Cloth  6 each Topical Daily   docusate sodium  100 mg Oral BID   levothyroxine  50 mcg Oral Q0600   mirtazapine  7.5 mg Oral QHS   psyllium  1 packet Oral Daily   traMADol  50 mg Oral BID   traZODone  50 mg Oral QHS   Continuous Infusions:  lactated ringers 100 mL/hr at 08/05/21 0429     LOS: 2 days      Time spent: 25 minutes   Noralee StainJennifer Marnie Fazzino, DO [facility] 08/05/2021, 11:20 AM   Available via Epic secure chat 7am-7pm After these hours, please refer to coverage provider listed on [email or web address]

## 2021-08-05 NOTE — Evaluation (Signed)
Physical Therapy Evaluation Patient Details Name: Tara Nolan MRN: [id number] DOB: January 07, 1919 Today's Date: 08/05/2021   History of Present Illness  Pt is a 87 year old female with  recent COVID infection with positive test on 06/28/2021, who lives at a assisted living facility and is under hospice care (for weight loss and malnutrition), who presents to the emergency department on 08/03/21 with pain after fall. Pt sustained Left intertochanteric hip fracture and s/p Cephalomedullary nail of left hip fracture on 08/03/21.  PMHx: CVA, hypothyroidism. admitted with weakness, compression fx of L1-L3  Clinical Impression  Pt is a 87 y.o. female POD #2 s/p Cephalomedullary nail of left hip fracture. Pt lives at an assisted living facility and is under hospice care (for weight loss and malnutrition). Per pt's daughter, pt is typically able to perform transfers to w/c independently using B UEs to reach for armrest on w/c and taking shuffle steps over and propels w/c with feet or grabbing with hands. Pt is currently limited by increased pain levels requiring MOD-MAX A for bed mobility and transfers. Pt will benefit from continued skilled PT to increase their independence and maximize safety with mobility.      Follow Up Recommendations Supervision for mobility/OOB (return to ALF)    Equipment Recommendations       Recommendations for Other Services       Precautions / Restrictions Precautions Precautions: Fall Restrictions Weight Bearing Restrictions: No Other Position/Activity Restrictions: WBAT      Mobility  Bed Mobility Overal bed mobility: Needs Assistance Bed Mobility: Supine to Sit;Sit to Supine     Supine to sit: Mod assist;Max assist;HOB elevated;+2 for safety/equipment;+2 for physical assistance Sit to supine: Mod assist;Max assist;+2 for physical assistance;+2 for safety/equipment;HOB elevated   General bed mobility comments: Pt required repeated verbal/tactile cues for  command following. MOD-MAX A +2 for progression of LEs and trunk to EOB. Pt with poor seated balance requiring MIN-MOD A to maintain upright posture and hand over hand facilitation for use of UE on bedrail to assist with holding herself up/forward weight shifting.    Transfers Overall transfer level: Needs assistance Equipment used: None Transfers: Squat Pivot Transfers     Squat pivot transfers: Mod assist;Max assist     General transfer comment: MOD-MAX A for performance of transfer to/from recliner from EOB. Pt able to initate UE placement to attempt transfer to recliner following verbal cues from therapist that goal is to get to chair. Pt becoming anxious and stating that she can't stand up or move her leg and that her leg is hurting with mobility. Pt with good initation of UE placement in prep for return to bed but grimacing/moaning in pain and touching Lt LE.  Ambulation/Gait                Stairs            Wheelchair Mobility    Modified Rankin (Stroke Patients Only)       Balance Overall balance assessment: Needs assistance Sitting-balance support: Single extremity supported;Feet supported Sitting balance-Leahy Scale: Poor   Postural control: Posterior lean                                   Pertinent Vitals/Pain Pain Assessment: Faces Faces Pain Scale: Hurts whole lot Pain Location: Lt hip Pain Descriptors / Indicators: Grimacing;Moaning Pain Intervention(s): Limited activity within patient's tolerance;Monitored during session;Repositioned    Home Living  Family/patient expects to be discharged to:: Assisted living                      Prior Function Level of Independence: Needs assistance   Gait / Transfers Assistance Needed: per pt's daughter, pt able to transfer independently using B UEs to reach for armrest on w/c and scooting hips over and propel w/c with feet or grabbing with hands           Hand Dominance         Extremity/Trunk Assessment   Upper Extremity Assessment Upper Extremity Assessment: Generalized weakness    Lower Extremity Assessment Lower Extremity Assessment: Generalized weakness    Cervical / Trunk Assessment Cervical / Trunk Assessment: Kyphotic  Communication   Communication: No difficulties (requires repetition for command following)  Cognition Arousal/Alertness: Lethargic Behavior During Therapy: Anxious Overall Cognitive Status: History of cognitive impairments - at baseline                                 General Comments: Pt lethargic upon entry, has been sleeping all day per RN and pt's daughter. More awake/alert with mobility. Pt disoriented x4, pt's daughter reports she is usually able to recall her name and DOB and remember who her daughter is at baseline, but has been having difficulty remembering, but reports memory seems to be improved since yesterday.      General Comments      Exercises     Assessment/Plan    PT Assessment Patient needs continued PT services  PT Problem List Decreased range of motion;Decreased strength;Decreased activity tolerance;Decreased balance;Decreased mobility;Pain       PT Treatment Interventions DME instruction;Functional mobility training;Therapeutic activities;Therapeutic exercise;Balance training;Patient/family education    PT Goals (Current goals can be found in the Care Plan section)  Acute Rehab PT Goals Patient Stated Goal: none stated at this time. Pt daughter reporting family hopes she will be able to return to ALF. PT Goal Formulation: With patient/family Time For Goal Achievement: 08/19/21 Potential to Achieve Goals: Fair    Frequency 7X/week   Barriers to discharge        Co-evaluation               AM-PAC PT "6 Clicks" Mobility  Outcome Measure Help needed turning from your back to your side while in a flat bed without using bedrails?: A Lot Help needed moving from lying on your  back to sitting on the side of a flat bed without using bedrails?: A Lot Help needed moving to and from a bed to a chair (including a wheelchair)?: A Lot Help needed standing up from a chair using your arms (e.g., wheelchair or bedside chair)?: Total Help needed to walk in hospital room?: Total Help needed climbing 3-5 steps with a railing? : Total 6 Click Score: 9    End of Session Equipment Utilized During Treatment: Gait belt Activity Tolerance: Patient limited by pain Patient left: in bed;with call bell/phone within reach;with bed alarm set;with nursing/sitter in room;with family/visitor present Nurse Communication: Mobility status PT Visit Diagnosis: Other abnormalities of gait and mobility (R26.89);Muscle weakness (generalized) (M62.81);Pain;History of falling (Z91.81) Pain - Right/Left: Left Pain - part of body: Hip    Time: 4496-7591 PT Time Calculation (min) (ACUTE ONLY): 40 min   Charges:   PT Evaluation $PT Eval Low Complexity: 1 Low PT Treatments $Therapeutic Activity: 23-37 mins  Lyman Speller PT, DPT  Acute Rehabilitation Services  Office [phone]  08/05/2021, 3:47 PM

## 2021-08-05 NOTE — Plan of Care (Signed)

## 2021-08-05 NOTE — Progress Notes (Signed)
PT Cancellation Note  Patient Details Name: Tara Nolan MRN: [id number] DOB: 10-05-1919   Cancelled Treatment:    Reason Eval/Treat Not Completed: Other (comment) (RN deferred, Pt sleeping. RN requesting not to wake pt and for PT to come back later. PT will follow up as schedule allows.)   Lyman Speller., PT, DPT  Acute Rehabilitation Services  Office [phone] 08/05/2021, 12:27 PM

## 2021-08-05 NOTE — Progress Notes (Signed)
SLP Cancellation Note  Patient Details Name: Tara Nolan MRN: [id number] DOB: [birth year]   Cancelled treatment:       Reason Eval/Treat Not Completed: Fatigue/lethargy limiting ability to participate; pt had prior therapies before ST attempt at BSE; family requesting downgrade to Dysphagia 1/thin (previous diet at [facility]).  Will message MD requesting this change;  ST will continue efforts as pt able.   Tressie Stalker, M.S., CCC-SLP 08/05/2021, 3:31 PM

## 2021-08-05 NOTE — Plan of Care (Signed)
  Problem: Nutrition: Goal: Adequate nutrition will be maintained Outcome: Progressing   Problem: Pain Managment: Goal: General experience of comfort will improve Outcome: Progressing   

## 2021-08-05 NOTE — Progress Notes (Signed)
[facility] [address] [facility]) Hospitalized Hospice Patient    Ms. Leatherbury is a current hospice patient with a terminal diagnosis of cerebrovascular disease. Ms. Cadena was found in her room by staff screaming c/o hip pain. She was transported to [facility] ED for evaluation of her hip pain. She is admitted with a left intertrochanteric hip fracture. This is a related hospital admission per Dr. Eulas Post with [facility].   Met with patient and son and daughter at bedside. Patient sleeping throughout most of visit. Per bedside nurse Tu and family patient was given pain medication after movement for changing and repositioning. They report she is doing as well as can be expected. They are still uncertain of discharge plan at this time as they have not heard back from the facility.    V/S: T 98.2, HR 87, BP 139/66, RR 16, spO2 94% 2L nasal cannula  I&O: 1845/1000 Labs: RBC 2.4, HGB 7.3, HCT 21.8 IV/PRN: LR continuous @ 100 ml/hr, morphine 1 mg IV x 1   Problem List: - left intertrochanteric fracture - nailing of left hip on 9/8, patient is comfortable - acute blood loss anemia - hgb dropped from 11>6.2, minimal blood loss during surgery, no signs of overt bleeding, plans to transfuse 1 unit and re-check CBC   She is inpatient appropriate as her pain and hip fracture needed to be managed operatively as well as transfusion of blood due to acute blood loss anemia.    GOC: Relief of pain from hip fracture. At baseline, she is mostly wheelchair bound but was able to transfer herself.  D/C planning: from [place]. [facility] is advising they cannot care for her with a surgical wound. Not sure if she will be able to return or not. PT unable to work with her today due to a drop in her hgb.  Family: son is [employer] planner, son and daughter present at today's visit IDT: updated   Transportation for our current hospice patients is provided by [facility], as they contract this service for our  active hospice patients.   Thank you,  Jhonnie Garner, RN, BSN, [facility] Liaison [phone]

## 2021-08-05 NOTE — Progress Notes (Signed)
PATIENT ID: Tara Nolan  MRN: [id number]  DOB/AGE:  1919/09/23 / 87 y.o.  2 Days Post-Op Procedure(s) (LRB): INTRAMEDULLARY (IM) NAIL FEMORAL (N/A)    PROGRESS NOTE Subjective: Patient is sleepy and minimally commutative in AM , no Nausea, no Vomiting, yes passing gas, . Taking PO well. Denies SOB, Chest or Calf Pain. Using Incentive Spirometer, PAS in place. Did not do therapy yesterday due to anemia.  Objective: Vital signs in last 24 hours: Vitals:   08/04/21 1823 08/04/21 1951 08/05/21 0238 08/05/21 0245  BP: 134/62 125/67 139/66   Pulse: 97 85 87   Resp:  20 16   Temp: 99.3 F (37.4 C) 98.5 F (36.9 C) 98.2 F (36.8 C)   TempSrc: Oral Oral Oral   SpO2: 97% 96% 94%   Weight:    41 kg  Height:          Intake/Output from previous day: I/O last 3 completed shifts: In: 3157.5 [P.O.:510; I.V.:1987.4; Blood:460.1; IV Piggyback:200] Out: 1165 [Urine:1165]   Intake/Output this shift: No intake/output data recorded.   LABORATORY DATA: Recent Labs    08/03/21 1001 08/04/21 0313 08/04/21 0907 08/05/21 0318  WBC 8.5  --  8.8 7.1  HGB 11.0*  --  6.2* 7.3*  HCT 33.2*  --  19.2* 21.8*  PLT 284  --  179 143*  NA 138 137  --   --   K 3.5 4.5  --   --   CL 106 104  --   --   CO2 24 23  --   --   BUN 14 17  --   --   CREATININE 0.66 0.93  --   --   GLUCOSE 109* 154*  --   --   INR 1.2  --   --   --   CALCIUM 8.6* 8.1*  --   --     Examination: Neurologically intact Neurovascular intact Intact pulses distally Dorsiflexion/Plantar flexion intact Incision: dressing C/D/I and no drainage Compartment soft} XR AP&Lat of hip shows well placed\fixed THA  Assessment:   2 Days Post-Op Procedure(s) (LRB): INTRAMEDULLARY (IM) NAIL FEMORAL (N/A) ADDITIONAL DIAGNOSIS:  Expected Acute Blood Loss Anemia,  dementia   Plan: PT/OT WBAT  DVT Prophylaxis: SCDx72 hrs, ASA 325 mg daily x 2 weeks  DISCHARGE PLAN: Hospice

## 2021-08-06 DIAGNOSIS — S72142A Displaced intertrochanteric fracture of left femur, initial encounter for closed fracture: Secondary | ICD-10-CM | POA: Diagnosis not present

## 2021-08-06 LAB — CBC
HCT: 22.3 % — ABNORMAL LOW (ref 36.0–46.0)
Hemoglobin: 7.3 g/dL — ABNORMAL LOW (ref 12.0–15.0)
MCH: 30.2 pg (ref 26.0–34.0)
MCHC: 32.7 g/dL (ref 30.0–36.0)
MCV: 92.1 fL (ref 80.0–100.0)
Platelets: 154 10*3/uL (ref 150–400)
RBC: 2.42 MIL/uL — ABNORMAL LOW (ref 3.87–5.11)
RDW: 19.4 % — ABNORMAL HIGH (ref 11.5–15.5)
WBC: 7.3 10*3/uL (ref 4.0–10.5)
nRBC: 0.3 % — ABNORMAL HIGH (ref 0.0–0.2)

## 2021-08-06 NOTE — Progress Notes (Signed)
PROGRESS NOTE    Tara Nolan  IHK:[id number] DOB: [birth year] DOA: 08/03/2021 PCP: Tara Inch, MD     Brief Narrative:  Tara Nolan is a 87 y.o. female with medical history significant for dementia, hypothyroidism, lumbar degenerative disc disease, recent COVID infection with positive test on 06/28/2021, who lives at a skilled nursing facility and is under hospice care (for weight loss and malnutrition), who presents to the emergency department on 08/03/21 with pain after fall.  Her fall was unwitnessed but occurred on 08/03/2021.  After the fall, the patient had pain in her left hip.  Imaging revealed left intertrochanteric hip fracture. She underwent cephalomedullary nail of left hip fracture on 9/8 by Dr. Susa Simmonds.   New events last 24 hours / Subjective: Has not had any urine output since Foley catheter was removed yesterday.  Patient is alert, family is hoping that she can be more awake today as she slept most of the day yesterday and did not eat much.  Awaiting to hear about discharge planning tomorrow.  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  Continue to monitor hemoglobin  Recent COVID infection -Out of window for isolation requirements  Dementia -Delirium precaution and supportive care  HTN -Hold norvasc due to blood loss and marginal BP   Hypothyroidism -Continue Synthroid   DVT prophylaxis:  SCDs Start: 08/03/21 1916 SCDs Start: 08/03/21 1323 Place and  maintain sequential compression device Start: 08/03/21 1018  Code Status:     Code Status Orders  (From admission, onward)           Start     Ordered   08/03/21 1325  Do not attempt resuscitation (DNR)  Continuous       Question Answer Comment  In the event of cardiac or respiratory ARREST Do not call a "code blue"   In the event of cardiac or respiratory ARREST Do not perform Intubation, CPR, defibrillation or ACLS   In the event of cardiac or respiratory ARREST Use medication by any route, position, wound care, and other measures to relive pain and suffering. May use oxygen, suction and manual treatment of airway obstruction as needed for comfort.      08/03/21 1325           Code Status History     Date Active Date Inactive Code Status Order ID Comments User Context   08/03/2021 1322 08/03/2021 1325 DNR [id number]  Marlow Baars, MD Inpatient   01/30/2020 1957 02/04/2020 1720 DNR [id number]  Hillary Bow, DO ED   01/30/2020 1909 01/30/2020 1957 DNR [id number]  Charlynne Pander, MD ED   11/22/2018 1724 11/27/2018 1738 DNR [id number]  Elease Etienne, MD Inpatient   09/08/2017 0322 09/12/2017 1930 DNR [id number]  Clydie Braun, MD ED   06/20/2012 1616 06/23/2012 1904 DNR [id number]  Deneise Lever, RN Inpatient      Advance Directive Documentation    Flowsheet Row Most Recent Value  Type of Advance Directive Out of facility DNR (pink  MOST or yellow form), Living will  Pre-existing out of facility DNR order (yellow form or pink MOST form) Yellow form placed in chart (order not valid for inpatient use)  "MOST" Form in Place? --      Family Communication: 2 family members at bedside Disposition Plan:  Status is: Inpatient  Remains inpatient appropriate because:Unsafe d/c plan  Dispo: The patient is from: ALF/hospice              Anticipated d/c is to: ALF/hospice              Patient currently is medically stable to d/c.  Awaiting placement, hopefully can figure  this out on Monday if her previous ALF will be able to take her back.  She is a poor SNF candidate as she is not rehabable and would lose hospice benefits if she were to be placed in SNF.   Difficult to place patient No   Antimicrobials:  Anti-infectives (From admission, onward)    Start     Dose/Rate Route Frequency Ordered Stop   08/03/21 2100  ceFAZolin (ANCEF) IVPB 2g/100 mL premix        2 g 200 mL/hr over 30 Minutes Intravenous Every 6 hours 08/03/21 1915 08/04/21 0243   08/03/21 1245  ceFAZolin (ANCEF) IVPB 2g/100 mL premix        2 g 200 mL/hr over 30 Minutes Intravenous On call to O.R. 08/03/21 1234 08/03/21 1507        Objective: Vitals:   08/05/21 0245 08/05/21 2056 08/06/21 0600 08/06/21 0637  BP:  137/64  (!) 108/55  Pulse:  90  83  Resp:  16  16  Temp:  98.2 F (36.8 C)  99.4 F (37.4 C)  TempSrc:  Oral  Oral  SpO2:  94%  93%  Weight: 41 kg  42.4 kg   Height:        Intake/Output Summary (Last 24 hours) at 08/06/2021 1316 Last data filed at 08/06/2021 0602 Gross per 24 hour  Intake 240 ml  Output 0 ml  Net 240 ml    Filed Weights   08/04/21 0418 08/05/21 0245 08/06/21 0600  Weight: 38.6 kg 41 kg 42.4 kg    Examination: General exam: Appears calm and comfortable   Data Reviewed: I have personally reviewed following labs and imaging studies  CBC: Recent Labs  Lab 08/03/21 1001 08/04/21 0907 08/05/21 0318 08/06/21 0640  WBC 8.5 8.8 7.1 7.3  NEUTROABS 6.6  --   --   --   HGB 11.0* 6.2* 7.3* 7.3*  HCT 33.2* 19.2* 21.8* 22.3*  MCV 96.8 98.0 90.8 92.1  PLT 284 179 143* 154    Basic Metabolic Panel: Recent Labs  Lab 08/03/21 1001 08/04/21 0313  NA 138 137  K 3.5 4.5  CL 106 104  CO2 24 23  GLUCOSE 109* 154*  BUN 14 17  CREATININE 0.66 0.93  CALCIUM 8.6* 8.1*    GFR: Estimated Creatinine Clearance: 22.1 mL/min (by C-G formula based on SCr of 0.93 mg/dL). Liver Function Tests: Recent Labs  Lab 08/03/21 1001  AST 22  ALT 14   ALKPHOS 64  BILITOT 0.5  PROT 7.0  ALBUMIN 3.7    No results for input(s): LIPASE, AMYLASE in the last 168 hours. No results for input(s): AMMONIA in the last 168 hours. Coagulation Profile: Recent Labs  Lab 08/03/21 1001  INR 1.2    Cardiac Enzymes: No results for input(s): CKTOTAL, CKMB, CKMBINDEX, TROPONINI in the last  168 hours. BNP (last 3 results) No results for input(s): PROBNP in the last 8760 hours. HbA1C: No results for input(s): HGBA1C in the last 72 hours. CBG: No results for input(s): GLUCAP in the last 168 hours. Lipid Profile: No results for input(s): CHOL, HDL, LDLCALC, TRIG, CHOLHDL, LDLDIRECT in the last 72 hours. Thyroid Function Tests: No results for input(s): TSH, T4TOTAL, FREET4, T3FREE, THYROIDAB in the last 72 hours. Anemia Panel: No results for input(s): VITAMINB12, FOLATE, FERRITIN, TIBC, IRON, RETICCTPCT in the last 72 hours. Sepsis Labs: No results for input(s): PROCALCITON, LATICACIDVEN in the last 168 hours.  Recent Results (from the past 240 hour(s))  Resp Panel by RT-PCR (Flu A&B, Covid) Nasopharyngeal Swab     Status: Abnormal   Collection Time: 08/03/21 10:01 AM   Specimen: Nasopharyngeal Swab; Nasopharyngeal(NP) swabs in vial transport medium  Result Value Ref Range Status   SARS Coronavirus 2 by RT PCR POSITIVE (A) NEGATIVE Final    Comment: RESULTS CONFIRMED BY MANUAL DILUTION RESULT CALLED TO, READ BACK BY AND VERIFIED WITH: HALL J. RN ON 08/03/2021 @ 1144 BY MECIAL J (NOTE) SARS-CoV-2 target nucleic acids are DETECTED.  The SARS-CoV-2 RNA is generally detectable in upper respiratory specimens during the acute phase of infection. Positive results are indicative of the presence of the identified virus, but do not rule out bacterial infection or co-infection with other pathogens not detected by the test. Clinical correlation with patient history and other diagnostic information is necessary to determine patient infection status.  The expected result is Negative.  Fact Sheet for Patients: BloggerCourse.com  Fact Sheet for Healthcare Providers: SeriousBroker.it  This test is not yet approved or cleared by the [place] FDA and  has been authorized for detection and/or diagnosis of SARS-CoV-2 by FDA under an Emergency Use Authorization (EUA).  This EUA  will remain in effect (meaning this test can be used) for the duration of  the COVID-19 declaration under Section 564(b)(1) of the Act, 21 U.S.C. section 360bbb-3(b)(1), unless the authorization is terminated or revoked sooner.     Influenza A by PCR NEGATIVE NEGATIVE Final   Influenza B by PCR NEGATIVE NEGATIVE Final    Comment: (NOTE) The Xpert Xpress SARS-CoV-2/FLU/RSV plus assay is intended as an aid in the diagnosis of influenza from Nasopharyngeal swab specimens and should not be used as a sole basis for treatment. Nasal washings and aspirates are unacceptable for Xpert Xpress SARS-CoV-2/FLU/RSV testing.  Fact Sheet for Patients: BloggerCourse.com  Fact Sheet for Healthcare Providers: SeriousBroker.it  This test is not yet approved or cleared by the [place] FDA and has been authorized for detection and/or diagnosis of SARS-CoV-2 by FDA under an Emergency Use Authorization (EUA). This EUA will remain in effect (meaning this test can be used) for the duration of the COVID-19 declaration under Section 564(b)(1) of the Act, 21 U.S.C. section 360bbb-3(b)(1), unless the authorization is terminated or revoked.  Performed at [facility], [address], [place], [place] [address]        Radiology Studies: No results found.    Scheduled Meds:  aspirin EC  325 mg Oral Q breakfast   Chlorhexidine Gluconate Cloth  6 each Topical Daily   docusate sodium  100 mg Oral BID   levothyroxine  50 mcg Oral Q0600   mirtazapine   7.5 mg Oral QHS   psyllium  1 packet Oral Daily   Continuous Infusions:     LOS: 3 days      Time spent:  20 minutes   Noralee StainJennifer Betha Shadix, DO [facility] 08/06/2021, 1:16 PM   Available via Epic secure chat 7am-7pm After these hours, please refer to coverage provider listed on [email or web address]

## 2021-08-06 NOTE — Progress Notes (Signed)
Physical Therapy Treatment Patient Details Name: Tara Nolan MRN: [id number] DOB: 1919-06-01 Today's Date: 08/06/2021    History of Present Illness Pt is a 87 year old female with  recent COVID infection with positive test on 06/28/2021, who lives at a assisted living facility and is under hospice care (for weight loss and malnutrition), who presents to the emergency department on 08/03/21 with pain after fall. Pt sustained Left intertochanteric hip fracture and s/p Cephalomedullary nail of left hip fracture on 08/03/21.  PMHx: CVA, hypothyroidism. admitted with weakness, compression fx of L1-L3    PT Comments    PT returning this afternoon to maximize pt safety with assist back to bed. Pt much more awake/alert this session and engaging in more conversation with staff in the room. Performed sit to stand x2 from elevated recliner surface with MOD A+2, pt with poor stability, decreased standing tolerance, and difficulty achieving upright posture due to pain. Pt still requires MOD-MAX A for performance of bed mobility and transfers due to increased pain levels and repeated multimodal cues for safe technique due to baseline cognitive deficits. Pt will benefit from continued skilled PT to increase their independence and maximize safety with mobility.   Follow Up Recommendations  Supervision for mobility/OOB (return to ALF)     Equipment Recommendations       Recommendations for Other Services       Precautions / Restrictions Precautions Precautions: Fall Restrictions Weight Bearing Restrictions: No Other Position/Activity Restrictions: WBAT    Mobility  Bed Mobility Overal bed mobility: Needs Assistance Bed Mobility: Sit to Supine       Sit to supine: Max assist;+2 for physical assistance;+2 for safety/equipment   General bed mobility comments: Pt in recliner upon entry; MAX A +2 for sit to supine transfer with cues for sequencing and safe hand placement. pt asking repeatedly  "what are we going to do now?"  verbal cues provided prior to each task/transfer    Transfers Overall transfer level: Needs assistance Equipment used: None;Rolling walker (2 wheeled) Transfers: Sit to/from [employer] Sit to Stand: +2 physical assistance;+2 safety/equipment;From elevated surface;Mod assist   Squat pivot transfers: Mod assist;Max assist;+2 safety/equipment;From elevated surface     General transfer comment: Sit to stand x2 from elevated recliner surface to assist with pericare, MOD A+2 and cues for safe hand placement. Pt with poor stability when using RW requiring +2 assist to maintain standing balance and assist from therapist for RW stability, unable to achieve full upright standing position when prompted by therapist, pt yelling out "I can't." Pt with lateral lean and increased knee/hip flexion in standing. MOD-MAX A for performance of squat pivot transfer from recliner to EOB. Hand over hand facilitation required for UE placement for transfer and for proper positioning of B LEs. D+2 for posterior scoot to reposition hips while sitting EOB.  Ambulation/Gait                 Stairs             Wheelchair Mobility    Modified Rankin (Stroke Patients Only)       Balance Overall balance assessment: Needs assistance Sitting-balance support: Single extremity supported;Feet supported Sitting balance-Leahy Scale: Poor   Postural control: Posterior lean Standing balance support: Bilateral upper extremity supported Standing balance-Leahy Scale: Poor Standing balance comment: use of external UE support and +2 assist  Cognition Arousal/Alertness: Awake/alert Behavior During Therapy: WFL for tasks assessed/performed Overall Cognitive Status: History of cognitive impairments - at baseline                                 General Comments: Pt able to verbalize her name when asked by PT this  afternoon, pt more awake and lucid intermittently stating that she hasn't had a fall this bad before.      Exercises      General Comments        Pertinent Vitals/Pain Pain Assessment: Faces Faces Pain Scale: Hurts even more Pain Location: Lt hip- with movement Pain Descriptors / Indicators: Grimacing;Moaning Pain Intervention(s): Limited activity within patient's tolerance;Monitored during session;Repositioned    Home Living                      Prior Function            PT Goals (current goals can now be found in the care plan section) Acute Rehab PT Goals Patient Stated Goal: none stated at this time. Pt daughter reporting family hopes she will be able to return to ALF. PT Goal Formulation: With patient/family Time For Goal Achievement: 08/19/21 Potential to Achieve Goals: Fair Progress towards PT goals: Progressing toward goals    Frequency    7X/week      PT Plan Current plan remains appropriate    Co-evaluation              AM-PAC PT "6 Clicks" Mobility   Outcome Measure  Help needed turning from your back to your side while in a flat bed without using bedrails?: A Lot Help needed moving from lying on your back to sitting on the side of a flat bed without using bedrails?: A Lot Help needed moving to and from a bed to a chair (including a wheelchair)?: A Lot Help needed standing up from a chair using your arms (e.g., wheelchair or bedside chair)?: Total Help needed to walk in hospital room?: Total Help needed climbing 3-5 steps with a railing? : Total 6 Click Score: 9    End of Session Equipment Utilized During Treatment: Gait belt Activity Tolerance: Patient limited by pain;Patient tolerated treatment well Patient left: with call bell/phone within reach;in bed;with nursing/sitter in room;with family/visitor present Nurse Communication: Mobility status PT Visit Diagnosis: Other abnormalities of gait and mobility (R26.89);Muscle weakness  (generalized) (M62.81);Pain;History of falling (Z91.81) Pain - Right/Left: Left Pain - part of body: Hip     Time: 6967-8938 PT Time Calculation (min) (ACUTE ONLY): 23 min  Charges:  $Therapeutic Activity: 23-37 mins                     Lyman Speller PT, DPT  Acute Rehabilitation Services  Office [phone]   08/06/2021, 4:15 PM

## 2021-08-06 NOTE — Progress Notes (Signed)
Physical Therapy Treatment Patient Details Name: Tara Nolan MRN: [id number] DOB: 1919-08-04 Today's Date: 08/06/2021    History of Present Illness Pt is a 87 year old female with  recent COVID infection with positive test on 06/28/2021, who lives at a assisted living facility and is under hospice care (for weight loss and malnutrition), who presents to the emergency department on 08/03/21 with pain after fall. Pt sustained Left intertochanteric hip fracture and s/p Cephalomedullary nail of left hip fracture on 08/03/21.  PMHx: CVA, hypothyroidism. admitted with weakness, compression fx of L1-L3    PT Comments    Pt is slowly progressing toward acute PT goals this session with slightly decreased assist required for bed mobility today. Pt still requires MOD-MAX A for performance of bed mobility and transfers due to increased pain levels and repeated multimodal cues for safe technique due to baseline cognitive deficits. Pt will benefit from continued skilled PT to increase their independence and maximize safety with mobility.     Follow Up Recommendations  Supervision for mobility/OOB (return to ALF)     Equipment Recommendations       Recommendations for Other Services       Precautions / Restrictions Precautions Precautions: Fall Restrictions Weight Bearing Restrictions: No Other Position/Activity Restrictions: WBAT    Mobility  Bed Mobility Overal bed mobility: Needs Assistance Bed Mobility: Supine to Sit     Supine to sit: Mod assist;HOB elevated;+2 for safety/equipment     General bed mobility comments: Pt required repeated verbal/tactile cues for command following. MOD A for progression of LEs and trunk to EOB. Pt with poor seated balance requiring MIN-MOD A to maintain upright posture and hand over hand facilitation for use of UE on bedrail to assist with holding herself up/forward weight shifting.    Transfers Overall transfer level: Needs assistance Equipment  used: None Transfers: Squat Pivot Transfers     Squat pivot transfers: Mod assist;Max assist     General transfer comment: MOD-MAX A for performance of transfer to recliner from EOB. Hand over hand facilitation required for UE placement for transfer and for proper positioning of B LEs. Pt very aware and slightly anxious about pain in her Lt hip intermittently shouting out in pain with mobility/ROM of Lt hip. Pt attempting to use B UEs on armrest to reposition hips in chair, ultimately D+2 for posterior scoot of hips to reposition in chair.  Ambulation/Gait                 Stairs             Wheelchair Mobility    Modified Rankin (Stroke Patients Only)       Balance Overall balance assessment: Needs assistance Sitting-balance support: Single extremity supported;Feet supported Sitting balance-Leahy Scale: Poor   Postural control: Posterior lean                                  Cognition Arousal/Alertness: Lethargic Behavior During Therapy: Anxious Overall Cognitive Status: History of cognitive impairments - at baseline                                 General Comments: Pt lethargic upon entry, daughter and son present. More awake/alert with mobility. Pt able to recall names of her daughter and son today, unable to verbalize her own name.      Exercises  General Comments        Pertinent Vitals/Pain Pain Assessment: Faces Faces Pain Scale: Hurts even more Pain Location: Lt hip Pain Descriptors / Indicators: Grimacing;Moaning Pain Intervention(s): Limited activity within patient's tolerance;Monitored during session;Repositioned;Ice applied    Home Living Family/patient expects to be discharged to:: Assisted living                    Prior Function Level of Independence: Needs assistance  Gait / Transfers Assistance Needed: per pt's daughter, pt able to transfer independently using B UEs to reach for armrest on w/c  and taking shuffle steps over and propels w/c with feet or grabbing with hands       PT Goals (current goals can now be found in the care plan section) Acute Rehab PT Goals Patient Stated Goal: none stated at this time. Pt daughter reporting family hopes she will be able to return to ALF. PT Goal Formulation: With patient/family Time For Goal Achievement: 08/19/21 Potential to Achieve Goals: Fair Progress towards PT goals: Progressing toward goals    Frequency    7X/week      PT Plan Current plan remains appropriate    Co-evaluation              AM-PAC PT "6 Clicks" Mobility   Outcome Measure  Help needed turning from your back to your side while in a flat bed without using bedrails?: A Lot Help needed moving from lying on your back to sitting on the side of a flat bed without using bedrails?: A Lot Help needed moving to and from a bed to a chair (including a wheelchair)?: A Lot Help needed standing up from a chair using your arms (e.g., wheelchair or bedside chair)?: Total Help needed to walk in hospital room?: Total Help needed climbing 3-5 steps with a railing? : Total 6 Click Score: 9    End of Session Equipment Utilized During Treatment: Gait belt Activity Tolerance: Patient limited by pain;Patient tolerated treatment well Patient left: with call bell/phone within reach;with family/visitor present;in chair;with chair alarm set Nurse Communication: Mobility status PT Visit Diagnosis: Other abnormalities of gait and mobility (R26.89);Muscle weakness (generalized) (M62.81);Pain;History of falling (Z91.81) Pain - Right/Left: Left Pain - part of body: Hip     Time: 1001-1034 PT Time Calculation (min) (ACUTE ONLY): 33 min  Charges:  $Therapeutic Activity: 23-37 mins                     Lyman Speller PT, DPT  Acute Rehabilitation Services  Office [phone]   08/06/2021, 11:14 AM

## 2021-08-06 NOTE — Progress Notes (Signed)
PATIENT ID: Tara Nolan  MRN: [id number]  DOB/AGE:  October 03, 1919 / 87 y.o.  3 Days Post-Op Procedure(s) (LRB): INTRAMEDULLARY (IM) NAIL FEMORAL (N/A)    PROGRESS NOTE Subjective:   Patient is resting in bed, no Nausea, no Vomiting,  Taking PO well. Denies SOB, Chest or Calf Pain. Using Incentive Spirometer, PAS in place. Ambulate WBAT with pt working on transfers, Patient reports pain as moderate and severe when working with therapy  Objective: Vital signs in last 24 hours: Temp:  [98.2 F (36.8 C)-99.4 F (37.4 C)] 99.4 F (37.4 C) (09/11 0637) Pulse Rate:  [83-90] 83 (09/11 0637) Resp:  [16] 16 (09/11 0637) BP: (108-137)/(55-64) 108/55 (09/11 0637) SpO2:  [93 %-94 %] 93 % (09/11 0637) Weight:  [42.4 kg] 42.4 kg (09/11 0600)    Intake/Output from previous day: I/O last 3 completed shifts: In: 1505.6 [P.O.:420; I.V.:1085.6] Out: 800 [Urine:800]   Intake/Output this shift: No intake/output data recorded.   LABORATORY DATA: Recent Labs    08/03/21 1001 08/04/21 0313 08/04/21 0907 08/05/21 0318 08/06/21 0640  WBC 8.5  --    < > 7.1 7.3  HGB 11.0*  --    < > 7.3* 7.3*  HCT 33.2*  --    < > 21.8* 22.3*  PLT 284  --    < > 143* 154  NA 138 137  --   --   --   K 3.5 4.5  --   --   --   CL 106 104  --   --   --   CO2 24 23  --   --   --   BUN 14 17  --   --   --   CREATININE 0.66 0.93  --   --   --   GLUCOSE 109* 154*  --   --   --   INR 1.2  --   --   --   --   CALCIUM 8.6* 8.1*  --   --   --    < > = values in this interval not displayed.    Examination: Neurologically intact Neurovascular intact Sensation intact distally Intact pulses distally Incision: dressing C/D/I No cellulitis present}  Assessment:   3 Days Post-Op Procedure(s) (LRB): INTRAMEDULLARY (IM) NAIL FEMORAL (N/A) ADDITIONAL DIAGNOSIS:   Dementia  Plan:  Weight Bearing as Tolerated (WBAT)  DVT Prophylaxis:  Aspirin  DISCHARGE PLAN: Skilled Nursing Facility/Rehab      Dannielle Burn 08/06/2021, 7:32 AM

## 2021-08-06 NOTE — Progress Notes (Signed)
Tara Nolan [address] [facility]) Hospitalized Hospice Patient    [name] is a current hospice patient with a terminal diagnosis of cerebrovascular disease. [name] was found in her room by staff screaming c/o hip pain. She was transported to [facility] ED for evaluation of her hip pain. She is admitted with a left intertrochanteric hip fracture. This is a related hospital admission per Dr. Eulas Post with [place].   Met with patient and daughter at bedside. Patient alert with off-topic comments at start of visit, did doze off during visit.  No acute distress noted; pt endorses mild pain.  Pt's daughter reports pt ate only bites at breakfast.  Pt has reported need to urinate but no urine output.     V/S: T 99.4 oral, HR 83, BP 108/55, RR 16, spO2 93% RA I&O: 1845/1000 Labs: RBC 2.42, HGB 7.3, HCT 22.3 IV/PRN: PRN pain management- hydrocodone-acataminophen 5-323m q4h x 1 dose in 24/hr   Problem List: Mechanical fall with closed inter trochanteric fracture of left femur -Status post surgical intervention 9/8 -Aspirin 325 mg daily for 2 weeks for DVT prophylaxis on discharge -PT OT   Acute blood loss anemia, postop -Status post 1 unit packed red blood cell.  Continue to monitor hemoglobin   Recent COVID infection -Out of window for isolation requirements   Dementia -Delirium precaution and supportive care   HTN -Hold norvasc due to blood loss and marginal BP   Hypothyroidism -Continue Synthroid     She is inpatient appropriate as her pain and hip fracture needed to be managed operatively as well as transfusion of blood due to acute blood loss anemia.    GOC: Relief of pain from hip fracture. At baseline, she is mostly wheelchair bound but was able to transfer herself.  [place] to verify with facility that they can take her back.   Family: son is NOK and dMedia planner Daughter present at today's visit IDT: updated    [facility] for our active hosp[facility] patients.   Thank you,  CDomenic Moras BSN, RN [facility] [phone]

## 2021-08-07 DIAGNOSIS — S72142A Displaced intertrochanteric fracture of left femur, initial encounter for closed fracture: Secondary | ICD-10-CM | POA: Diagnosis not present

## 2021-08-07 LAB — TYPE AND SCREEN
ABO/RH(D): B POS
Antibody Screen: NEGATIVE
Unit division: 0

## 2021-08-07 LAB — BPAM RBC
Blood Product Expiration Date: 202209302359
ISSUE DATE / TIME: 202209091430
Unit Type and Rh: 7300

## 2021-08-07 LAB — CBC
HCT: 21.5 % — ABNORMAL LOW (ref 36.0–46.0)
Hemoglobin: 7.2 g/dL — ABNORMAL LOW (ref 12.0–15.0)
MCH: 30.6 pg (ref 26.0–34.0)
MCHC: 33.5 g/dL (ref 30.0–36.0)
MCV: 91.5 fL (ref 80.0–100.0)
Platelets: 169 10*3/uL (ref 150–400)
RBC: 2.35 MIL/uL — ABNORMAL LOW (ref 3.87–5.11)
RDW: 18.6 % — ABNORMAL HIGH (ref 11.5–15.5)
WBC: 7.2 10*3/uL (ref 4.0–10.5)
nRBC: 0.4 % — ABNORMAL HIGH (ref 0.0–0.2)

## 2021-08-07 MED ORDER — ASPIRIN 325 MG PO TBEC: 325.0000 mg | DELAYED_RELEASE_TABLET | Freq: Every day | ORAL | 0 refills | Status: AC

## 2021-08-07 MED ORDER — ZINC OXIDE 40 % EX OINT: TOPICAL_OINTMENT | CUTANEOUS | 0 refills | Status: AC

## 2021-08-07 MED ORDER — DOCUSATE SODIUM 50 MG/5ML PO LIQD
100.0000 mg | Freq: Two times a day (BID) | ORAL | Status: DC
  Administered 2021-08-07: 100 mg via ORAL
  Filled 2021-08-07: qty 10

## 2021-08-07 MED ORDER — ZINC OXIDE 40 % EX OINT: TOPICAL_OINTMENT | CUTANEOUS | 0 refills | Status: DC

## 2021-08-07 NOTE — Progress Notes (Signed)
Pt had no any urinary output untill 12 MN. Bladder scan done, showed 437 ml urine. In and out cath done and drained 325 ml urine. Pt was stable and resting well after that. Will continue to monitor.

## 2021-08-07 NOTE — Progress Notes (Signed)
SLP Cancellation Note  Patient Details Name: Tara Nolan MRN: [id number] DOB: 08/08/1919   Cancelled treatment:       Reason Eval/Treat Not Completed: Other (comment). SLP communicated with MD via secure Epic chat. Patient apparently back to baseline and being discharged to ALF on Dys 1 solids, thin liquids. BSE no longer needed/warranted.    Angela Nevin, MA, CCC-SLP Speech Therapy

## 2021-08-07 NOTE — Progress Notes (Signed)
Called facility and gave report to [facility], all questions and concerns addressed, Pt not in acute distress. Pt's son and daughter at bedside. Pt discharged with belongings via PTAR.

## 2021-08-07 NOTE — Discharge Summary (Addendum)
Physician Discharge Summary  Tara Nolan ZOX:[id number] DOB: [birth year] DOA: 08/03/2021  PCP: [name], MD  Admit date: 08/03/2021 Discharge date: 08/07/2021  Admitted From: ALF Disposition:  ALF   Recommendations for Outpatient Follow-up:  Follow up with orthopedic surgery Outpatient voiding trial, patient had urinary retention post-surgery  Discharge Condition: Stable CODE STATUS: DNR  Diet recommendation: Dysphagia 1    Brief/Interim Summary: Tara Nolan is a 87 y.o. female with medical history significant for dementia, hypothyroidism, lumbar degenerative disc disease, recent COVID infection with positive test on 06/28/2021, who lives at a skilled nursing facility and is under hospice care (for weight loss and malnutrition), who presents to the emergency department on 08/03/21 with pain after fall.  Her fall was unwitnessed but occurred on 08/03/2021.  After the fall, the patient had pain in her left hip.  Imaging revealed left intertrochanteric hip fracture. She underwent cephalomedullary nail of left hip fracture on 9/8 by Dr. Susa Simmonds.   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  Continue to monitor hemoglobin   Recent COVID infection -Out of window for isolation requirements   Dementia -Delirium precaution and supportive care   HTN -Resume norvasc   Hypothyroidism -Continue Synthroid  Acute urinary retention -Foley placed before discharge. Outpatient voiding  trial.   Discharge Instructions  Discharge Instructions     Call MD for:  difficulty breathing, headache or visual disturbances   Complete by: As directed    Call MD for:  extreme fatigue   Complete by: As directed    Call MD for:  persistant dizziness or light-headedness   Complete by: As directed    Call MD for:  persistant nausea and vomiting   Complete by: As directed    Call MD for:  redness, tenderness, or signs of infection (pain, swelling, redness, odor or green/yellow discharge around incision site)   Complete by: As directed    Call MD for:  severe uncontrolled pain   Complete by: As directed    Call MD for:  temperature >100.4   Complete by: As directed    Discharge instructions   Complete by: As directed    You were cared for by a hospitalist during your hospital stay. If you have any questions about your discharge medications or the care you received while you were in the hospital after you are discharged, you can call the unit and ask to speak with the hospitalist on call if the hospitalist that took care of you is not available. Once you are discharged, your primary care physician will handle any further medical issues. Please note that NO REFILLS for any discharge medications will be authorized once you are discharged, as it is imperative that you return to your primary care physician (or establish a relationship with a primary care physician if you do not have one) for your aftercare needs so that they can reassess your need for medications and monitor your lab values.   Increase activity slowly   Complete by: As directed  Leave dressing on - Keep it clean, dry, and intact until clinic visit   Complete by: As directed    No wound care   Complete by: As directed       Allergies as of 08/07/2021   No Known Allergies      Medication List     TAKE these medications    acetaminophen 325 MG tablet Commonly known as: TYLENOL Take 1 tablet (325 mg total) by mouth  every 6 (six) hours. What changed:  how much to take when to take this additional instructions   amLODipine 5 MG tablet Commonly known as: NORVASC Take 1 tablet (5 mg total) by mouth daily. Notes to patient: Last dose given 09/09 08:44am   aspirin 325 MG EC tablet Take 1 tablet (325 mg total) by mouth daily with breakfast for 14 days. Start taking on: August 08, 2021   levothyroxine 50 MCG tablet Commonly known as: SYNTHROID Take 50 mcg by mouth daily before breakfast.   liver oil-zinc oxide 40 % ointment Commonly known as: DESITIN Apply to buttocks twice daily What changed:  how much to take how to take this when to take this additional instructions   liver oil-zinc oxide 40 % ointment Commonly known as: DESITIN Apply 2 x as needed for personal care What changed: You were already taking a medication with the same name, and this prescription was added. Make sure you understand how and when to take each.   LORazepam 0.5 MG tablet Commonly known as: ATIVAN Take 0.25 mg by mouth every 6 (six) hours as needed for anxiety (agitation). Notes to patient: Resume home regimen   mirtazapine 15 MG tablet Commonly known as: REMERON Take 7.5 mg by mouth at bedtime.   polyethylene glycol 17 g packet Commonly known as: MIRALAX / GLYCOLAX Take 17 g by mouth daily. What changed:  when to take this reasons to take this Notes to patient: Resume home regimen   polyvinyl alcohol 1.4 % ophthalmic solution Commonly known as: LIQUIFILM TEARS Place 2 drops into both eyes every 2 (two) hours as needed for dry eyes. Notes to patient: Resume home regimen   PSYLLIUM PO Take 500 mg by mouth daily.   senna-docusate 8.6-50 MG tablet Commonly known as: Senokot-S Take 1 tablet by mouth at bedtime as needed for mild constipation or moderate constipation. Notes to patient: Resume home regimen   traMADol 50 MG tablet Commonly known as: ULTRAM Take 50 mg by mouth 2 (two) times daily.    traZODone 50 MG tablet Commonly known as: DESYREL Take 50 mg by mouth at bedtime.               Discharge Care Instructions  (From admission, onward)           Start     Ordered   08/07/21 0000  Leave dressing on - Keep it clean, dry, and intact until clinic visit        08/07/21 1126            Follow-up Information     Terance Hart, MD Follow up.   Specialty: Orthopedic Surgery Contact information: [address] [place] [address] [phone]                No Known Allergies  Consultations: Ortho   Procedures/Studies: CT HEAD WO CONTRAST ( )  Result Date: 08/03/2021 CLINICAL DATA:  Unwitnessed fall, found on floor EXAM: CT HEAD WITHOUT CONTRAST CT CERVICAL SPINE WITHOUT CONTRAST TECHNIQUE: Multidetector CT  imaging of the head and cervical spine was performed following the standard protocol without intravenous contrast. Multiplanar CT image reconstructions of the cervical spine were also generated. COMPARISON:  08/21/2020 FINDINGS: CT HEAD FINDINGS Brain: No evidence of acute infarction, hemorrhage, hydrocephalus, extra-axial collection or mass lesion/mass effect. Periventricular and deep white matter hypodensity. Mild global cerebral volume loss. Vascular: No hyperdense vessel or unexpected calcification. Skull: Normal. Negative for fracture or focal lesion. Sinuses/Orbits: No acute finding. Other: None. CT CERVICAL SPINE FINDINGS Alignment: Normal. Skull base and vertebrae: No acute fracture. No primary bone lesion or focal pathologic process. Soft tissues and spinal canal: No prevertebral fluid or swelling. No visible canal hematoma. Disc levels: Moderate multilevel disc space height loss and osteophytosis. Upper chest: Negative. Other: None. IMPRESSION: 1. No acute intracranial pathology. Small-vessel white matter disease and mild global cerebral volume loss in keeping with advanced patient age. 2. No fracture or static subluxation of the  cervical spine. 3. Moderate multilevel cervical disc degenerative disease. Electronically Signed   By: Lauralyn PrimesAlex  Bibbey M.D.   On: 08/03/2021 11:01   CT Cervical Spine Wo Contrast  Result Date: 08/03/2021 CLINICAL DATA:  Unwitnessed fall, found on floor EXAM: CT HEAD WITHOUT CONTRAST CT CERVICAL SPINE WITHOUT CONTRAST TECHNIQUE: Multidetector CT imaging of the head and cervical spine was performed following the standard protocol without intravenous contrast. Multiplanar CT image reconstructions of the cervical spine were also generated. COMPARISON:  08/21/2020 FINDINGS: CT HEAD FINDINGS Brain: No evidence of acute infarction, hemorrhage, hydrocephalus, extra-axial collection or mass lesion/mass effect. Periventricular and deep white matter hypodensity. Mild global cerebral volume loss. Vascular: No hyperdense vessel or unexpected calcification. Skull: Normal. Negative for fracture or focal lesion. Sinuses/Orbits: No acute finding. Other: None. CT CERVICAL SPINE FINDINGS Alignment: Normal. Skull base and vertebrae: No acute fracture. No primary bone lesion or focal pathologic process. Soft tissues and spinal canal: No prevertebral fluid or swelling. No visible canal hematoma. Disc levels: Moderate multilevel disc space height loss and osteophytosis. Upper chest: Negative. Other: None. IMPRESSION: 1. No acute intracranial pathology. Small-vessel white matter disease and mild global cerebral volume loss in keeping with advanced patient age. 2. No fracture or static subluxation of the cervical spine. 3. Moderate multilevel cervical disc degenerative disease. Electronically Signed   By: Lauralyn PrimesAlex  Bibbey M.D.   On: 08/03/2021 11:01   DG Chest Portable 1 View  Result Date: 08/03/2021 CLINICAL DATA:  Larey SeatFell. Left hip fracture. EXAM: PORTABLE CHEST 1 VIEW COMPARISON:  Chest x-ray 08/21/2020 FINDINGS: The cardiac silhouette, mediastinal and hilar contours are within normal limits given the patient's age, AP projection and  portable technique. There is moderate tortuosity and calcification of the thoracic aorta. Chronic pulmonary scarring changes but no acute pulmonary findings. The bony thorax is grossly intact. IMPRESSION: Chronic lung changes but no acute pulmonary findings. Electronically Signed   By: Rudie MeyerP.  Gallerani M.D.   On: 08/03/2021 09:55   DG C-Arm 1-60 Min-No Report  Result Date: 08/03/2021 Fluoroscopy was utilized by the requesting physician.  No radiographic interpretation.   DG HIP OPERATIVE UNILAT W OR W/O PELVIS LEFT  Result Date: 08/03/2021 CLINICAL DATA:  Intraoperative images for placement of intramedullary nail. EXAM: OPERATIVE LEFT HIP (WITH PELVIS IF PERFORMED) 14 VIEWS TECHNIQUE: Fluoroscopic spot image(s) were submitted for interpretation post-operatively. COMPARISON:  Preoperative evaluation August 03, 2021. FINDINGS: Intraoperative fluoroscopic spot views demonstrate changes of ORIF of a LEFT femoral fracture that was seen on previous imaging with cephalo medullary nail placement and placement of screw across  the femoral neck. Initial images again show the fracture and bit for reaming of the medullary canal. Placement of the femoral component with proximal screw crossing the femoral neck without apparent complicating features. Distal interlocking screw in place on final images. Near anatomic alignment with some distraction of the lesser trochanter. FLUOROSCOPY TIME:  43 seconds IMPRESSION: Intraoperative fluoroscopic views provided for placement of intramedullary device and proximal cephalomedullary and distal interlocking screws about a LEFT intertrochanteric fracture. No immediate complications. Electronically Signed   By: Donzetta Kohut M.D.   On: 08/03/2021 16:28   DG Hip Unilat W or Wo Pelvis 2-3 Views Left  Result Date: 08/03/2021 CLINICAL DATA:  Larey Seat. Left hip pain. EXAM: DG HIP (WITH OR WITHOUT PELVIS) 2-3V LEFT COMPARISON:  08/21/2020 FINDINGS: There is a displaced intertrochanteric fracture  of the left hip with a severe varus deformity. The hips are normally located. The bony pelvis is grossly intact. IMPRESSION: Displaced intertrochanteric fracture of the left hip. Electronically Signed   By: Rudie Meyer M.D.   On: 08/03/2021 09:56       Discharge Exam: Vitals:   08/07/21 0546 08/07/21 1201  BP: 129/83 131/74  Pulse: 85 88  Resp: 16 15  Temp: 98.9 F (37.2 C) 98.1 F (36.7 C)  SpO2: 92% 94%    General: Pt is alert, awake, not in acute distress Cardiovascular: RRR, S1/S2 +, no edema Respiratory: CTA bilaterally, no wheezing, no rhonchi, no respiratory distress, no conversational dyspnea  Abdominal: Soft, NT, ND, bowel sounds + Extremities: no edema, no cyanosis Psych: Stable, dementia     The results of significant diagnostics from this hospitalization (including imaging, microbiology, ancillary and laboratory) are listed below for reference.     Microbiology: Recent Results (from the past 240 hour(s))  Resp Panel by RT-PCR (Flu A&B, Covid) Nasopharyngeal Swab     Status: Abnormal   Collection Time: 08/03/21 10:01 AM   Specimen: Nasopharyngeal Swab; Nasopharyngeal(NP) swabs in vial transport medium  Result Value Ref Range Status   SARS Coronavirus 2 by RT PCR POSITIVE (A) NEGATIVE Final    Comment: RESULTS CONFIRMED BY MANUAL DILUTION RESULT CALLED TO, READ BACK BY AND VERIFIED WITH: HALL J. RN ON 08/03/2021 @ 1144 BY MECIAL J (NOTE) SARS-CoV-2 target nucleic acids are DETECTED.  The SARS-CoV-2 RNA is generally detectable in upper respiratory specimens during the acute phase of infection. Positive results are indicative of the presence of the identified virus, but do not rule out bacterial infection or co-infection with other pathogens not detected by the test. Clinical correlation with patient history and other diagnostic information is necessary to determine patient infection status. The expected result is Negative.  Fact Sheet for  Patients: BloggerCourse.com  Fact Sheet for Healthcare Providers: SeriousBroker.it  This test is not yet approved or cleared by the [place] FDA and  has been authorized for detection and/or diagnosis of SARS-CoV-2 by FDA under an Emergency Use Authorization (EUA).  This EUA  will remain in effect (meaning this test can be used) for the duration of  the COVID-19 declaration under Section 564(b)(1) of the Act, 21 U.S.C. section 360bbb-3(b)(1), unless the authorization is terminated or revoked sooner.     Influenza A by PCR NEGATIVE NEGATIVE Final   Influenza B by PCR NEGATIVE NEGATIVE Final    Comment: (NOTE) The Xpert Xpress SARS-CoV-2/FLU/RSV plus assay is intended as an aid in the diagnosis of influenza from Nasopharyngeal swab specimens and should not be used as a sole basis for treatment. Nasal  washings and aspirates are unacceptable for Xpert Xpress SARS-CoV-2/FLU/RSV testing.  Fact Sheet for Patients: BloggerCourse.com  Fact Sheet for Healthcare Providers: SeriousBroker.it  This test is not yet approved or cleared by the [place] FDA and has been authorized for detection and/or diagnosis of SARS-CoV-2 by FDA under an Emergency Use Authorization (EUA). This EUA will remain in effect (meaning this test can be used) for the duration of the COVID-19 declaration under Section 564(b)(1) of the Act, 21 U.S.C. section 360bbb-3(b)(1), unless the authorization is terminated or revoked.  Performed at [facility], [address] [place], [place] [address]      Labs: BNP (last 3 results) No results for input(s): BNP in the last 8760 hours. Basic Metabolic Panel: Recent Labs  Lab 08/03/21 1001 08/04/21 0313  NA 138 137  K 3.5 4.5  CL 106 104  CO2 24 23  GLUCOSE 109* 154*  BUN 14 17  CREATININE 0.66 0.93  CALCIUM 8.6* 8.1*   Liver Function  Tests: Recent Labs  Lab 08/03/21 1001  AST 22  ALT 14  ALKPHOS 64  BILITOT 0.5  PROT 7.0  ALBUMIN 3.7   No results for input(s): LIPASE, AMYLASE in the last 168 hours. No results for input(s): AMMONIA in the last 168 hours. CBC: Recent Labs  Lab 08/03/21 1001 08/04/21 0907 08/05/21 0318 08/06/21 0640 08/07/21 0311  WBC 8.5 8.8 7.1 7.3 7.2  NEUTROABS 6.6  --   --   --   --   HGB 11.0* 6.2* 7.3* 7.3* 7.2*  HCT 33.2* 19.2* 21.8* 22.3* 21.5*  MCV 96.8 98.0 90.8 92.1 91.5  PLT 284 179 143* 154 169   Cardiac Enzymes: No results for input(s): CKTOTAL, CKMB, CKMBINDEX, TROPONINI in the last 168 hours. BNP: Invalid input(s): POCBNP CBG: No results for input(s): GLUCAP in the last 168 hours. D-Dimer No results for input(s): DDIMER in the last 72 hours. Hgb A1c No results for input(s): HGBA1C in the last 72 hours. Lipid Profile No results for input(s): CHOL, HDL, LDLCALC, TRIG, CHOLHDL, LDLDIRECT in the last 72 hours. Thyroid function studies No results for input(s): TSH, T4TOTAL, T3FREE, THYROIDAB in the last 72 hours.  Invalid input(s): FREET3 Anemia work up No results for input(s): VITAMINB12, FOLATE, FERRITIN, TIBC, IRON, RETICCTPCT in the last 72 hours. Urinalysis    Component Value Date/Time   COLORURINE STRAW (A) 01/30/2020 1637   APPEARANCEUR CLEAR 01/30/2020 1637   LABSPEC 1.006 01/30/2020 1637   PHURINE 8.0 01/30/2020 1637   GLUCOSEU 50 (A) 01/30/2020 1637   HGBUR NEGATIVE 01/30/2020 1637   BILIRUBINUR NEGATIVE 01/30/2020 1637   KETONESUR NEGATIVE 01/30/2020 1637   PROTEINUR NEGATIVE 01/30/2020 1637   NITRITE NEGATIVE 01/30/2020 1637   LEUKOCYTESUR NEGATIVE 01/30/2020 1637   Sepsis Labs Invalid input(s): PROCALCITONIN,  WBC,  LACTICIDVEN Microbiology Recent Results (from the past 240 hour(s))  Resp Panel by RT-PCR (Flu A&B, Covid) Nasopharyngeal Swab     Status: Abnormal   Collection Time: 08/03/21 10:01 AM   Specimen: Nasopharyngeal Swab;  Nasopharyngeal(NP) swabs in vial transport medium  Result Value Ref Range Status   SARS Coronavirus 2 by RT PCR POSITIVE (A) NEGATIVE Final    Comment: RESULTS CONFIRMED BY MANUAL DILUTION RESULT CALLED TO, READ BACK BY AND VERIFIED WITH: HALL J. RN ON 08/03/2021 @ 1144 BY MECIAL J (NOTE) SARS-CoV-2 target nucleic acids are DETECTED.  The SARS-CoV-2 RNA is generally detectable in upper respiratory specimens during the acute phase of infection. Positive results are indicative of  the presence of the identified virus, but do not rule out bacterial infection or co-infection with other pathogens not detected by the test. Clinical correlation with patient history and other diagnostic information is necessary to determine patient infection status. The expected result is Negative.  Fact Sheet for Patients: BloggerCourse.com  Fact Sheet for Healthcare Providers: SeriousBroker.it  This test is not yet approved or cleared by the [place] FDA and  has been authorized for detection and/or diagnosis of SARS-CoV-2 by FDA under an Emergency Use Authorization (EUA).  This EUA  will remain in effect (meaning this test can be used) for the duration of  the COVID-19 declaration under Section 564(b)(1) of the Act, 21 U.S.C. section 360bbb-3(b)(1), unless the authorization is terminated or revoked sooner.     Influenza A by PCR NEGATIVE NEGATIVE Final   Influenza B by PCR NEGATIVE NEGATIVE Final    Comment: (NOTE) The Xpert Xpress SARS-CoV-2/FLU/RSV plus assay is intended as an aid in the diagnosis of influenza from Nasopharyngeal swab specimens and should not be used as a sole basis for treatment. Nasal washings and aspirates are unacceptable for Xpert Xpress SARS-CoV-2/FLU/RSV testing.  Fact Sheet for Patients: BloggerCourse.com  Fact Sheet for Healthcare Providers: SeriousBroker.it  This  test is not yet approved or cleared by the [place] FDA and has been authorized for detection and/or diagnosis of SARS-CoV-2 by FDA under an Emergency Use Authorization (EUA). This EUA will remain in effect (meaning this test can be used) for the duration of the COVID-19 declaration under Section 564(b)(1) of the Act, 21 U.S.C. section 360bbb-3(b)(1), unless the authorization is terminated or revoked.  Performed at [facility], [address] [place], [place] [address]      Patient was seen and examined on the day of discharge and was found to be in stable condition. Time coordinating discharge: 35 minutes including assessment and coordination of care, as well as examination of the patient.   SIGNED:  Noralee Stain, DO [facility] 08/07/2021, 1:46 PM

## 2021-08-07 NOTE — TOC Transition Note (Signed)
Transition of Care [facility]) - CM/SW Discharge Note   Patient Details  Name: MILCA SYTSMA MRN: [id number] Date of Birth: 12-Jul-1919  Transition of Care [facility]) CM/SW Contact:  Amada Jupiter, LCSW Phone Number: 08/07/2021, 2:11 PM   Clinical Narrative:    Pt medically cleared for dc today.  Requested documentation faxed to [facility] for review and they have cleared pt for return to facility. Pt and family very pleased.  Have [facility] of plan for [facility] to return to [facility]F today.  [facility] called at 1:50pm.  No further TOC needs.   Final next level of care: Assisted Living Barriers to Discharge: Barriers Resolved   Patient Goals and CMS Choice Patient states their goals for this hospitalization and ongoing recovery are:: son hopeful pt will be cleared to return to [facility]      Discharge Placement              Patient chooses bed at:  (return to [facility]) Patient to be transferred to facility by: [facility] Name of family member notified: son and daughter Patient and family notified of of transfer: 08/07/21  Discharge Plan and Services In-house Referral: Clinical Social Work              DME Arranged: N/A DME Agency: NA                  Social Determinants of Health (SDOH) Interventions     Readmission Risk Interventions No flowsheet data found.

## 2021-08-08 ENCOUNTER — Encounter (HOSPITAL_COMMUNITY): Payer: Self-pay | Admitting: Orthopaedic Surgery

## 2022-04-26 DEATH — deceased
